# Patient Record
Sex: Male | Born: 1975 | Race: White | Hispanic: No | Marital: Single | Smoking: Current some day smoker
Health system: Southern US, Community
[De-identification: ages and names within clinical notes are randomized; demographics above are authoritative.]

## PROBLEM LIST (undated history)

## (undated) DIAGNOSIS — R000 Tachycardia, unspecified: Secondary | ICD-10-CM

## (undated) DIAGNOSIS — M419 Scoliosis, unspecified: Secondary | ICD-10-CM

## (undated) DIAGNOSIS — T7840XA Allergy, unspecified, initial encounter: Secondary | ICD-10-CM

## (undated) HISTORY — DX: Allergy, unspecified, initial encounter: T78.40XA

## (undated) HISTORY — DX: Tachycardia, unspecified: R00.0

## (undated) HISTORY — PX: COLONOSCOPY: SHX174

---
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
## 2019-06-08 HISTORY — PX: TRANSTHORACIC ECHOCARDIOGRAM: SHX275

## 2021-04-19 ENCOUNTER — Other Ambulatory Visit: Payer: Self-pay

## 2021-04-19 ENCOUNTER — Emergency Department (HOSPITAL_COMMUNITY)
Admission: EM | Admit: 2021-04-19 | Discharge: 2021-04-19 | Disposition: A | Discharge status: Home / Self Care | Payer: Managed Care, Other (non HMO) | Attending: Emergency Medicine | Admitting: Emergency Medicine

## 2021-04-19 ENCOUNTER — Emergency Department (HOSPITAL_COMMUNITY): Payer: Managed Care, Other (non HMO)

## 2021-04-19 ENCOUNTER — Encounter (HOSPITAL_COMMUNITY): Payer: Self-pay | Admitting: Emergency Medicine

## 2021-04-19 DIAGNOSIS — R609 Edema, unspecified: Secondary | ICD-10-CM

## 2021-04-19 DIAGNOSIS — Z91040 Latex allergy status: Secondary | ICD-10-CM | POA: Diagnosis not present

## 2021-04-19 DIAGNOSIS — R002 Palpitations: Secondary | ICD-10-CM

## 2021-04-19 DIAGNOSIS — R200 Anesthesia of skin: Secondary | ICD-10-CM

## 2021-04-19 DIAGNOSIS — R0602 Shortness of breath: Secondary | ICD-10-CM | POA: Diagnosis not present

## 2021-04-19 DIAGNOSIS — F17200 Nicotine dependence, unspecified, uncomplicated: Secondary | ICD-10-CM | POA: Insufficient documentation

## 2021-04-19 DIAGNOSIS — R718 Other abnormality of red blood cells: Secondary | ICD-10-CM | POA: Diagnosis not present

## 2021-04-19 DIAGNOSIS — R531 Weakness: Secondary | ICD-10-CM | POA: Diagnosis not present

## 2021-04-19 DIAGNOSIS — R000 Tachycardia, unspecified: Secondary | ICD-10-CM

## 2021-04-19 DIAGNOSIS — R202 Paresthesia of skin: Secondary | ICD-10-CM | POA: Insufficient documentation

## 2021-04-19 DIAGNOSIS — Z20822 Contact with and (suspected) exposure to covid-19: Secondary | ICD-10-CM | POA: Diagnosis not present

## 2021-04-19 DIAGNOSIS — R600 Localized edema: Secondary | ICD-10-CM | POA: Diagnosis not present

## 2021-04-19 DIAGNOSIS — E876 Hypokalemia: Secondary | ICD-10-CM | POA: Diagnosis not present

## 2021-04-19 DIAGNOSIS — D7589 Other specified diseases of blood and blood-forming organs: Secondary | ICD-10-CM

## 2021-04-19 HISTORY — DX: Scoliosis, unspecified: M41.9

## 2021-04-19 LAB — ETHANOL: Alcohol, Ethyl (B): <10 mg/dL (ref <10)

## 2021-04-19 LAB — I-STAT VENOUS BLOOD GAS, ED
Acid-Base Excess: 1 mmol/L (ref 0.0–2.0)
Bicarbonate: 26.6 mmol/L (ref 20.0–28.0)
Calcium, Ion: 1.05 mmol/L — ABNORMAL LOW (ref 1.15–1.40)
HCT: 44 % (ref 39.0–52.0)
Hemoglobin: 15 g/dL (ref 13.0–17.0)
O2 Saturation: 42 %
Potassium: 3.2 mmol/L — ABNORMAL LOW (ref 3.5–5.1)
Sodium: 138 mmol/L (ref 135–145)
TCO2: 28 mmol/L (ref 22–32)
pCO2, Ven: 44.3 mmHg (ref 44.0–60.0)
pH, Ven: 7.386 (ref 7.250–7.430)
pO2, Ven: 24 mmHg — CL (ref 32.0–45.0)

## 2021-04-19 LAB — COMPREHENSIVE METABOLIC PANEL
ALT: 94 U/L — ABNORMAL HIGH (ref 0–44)
AST: 107 U/L — ABNORMAL HIGH (ref 15–41)
Albumin: 3.5 g/dL (ref 3.5–5.0)
Alkaline Phosphatase: 143 U/L — ABNORMAL HIGH (ref 38–126)
Anion gap: 8 (ref 5–15)
BUN: 11 mg/dL (ref 6–20)
CO2: 26 mmol/L (ref 22–32)
Calcium: 8.3 mg/dL — ABNORMAL LOW (ref 8.9–10.3)
Chloride: 102 mmol/L (ref 98–111)
Creatinine, Ser: 1.23 mg/dL (ref 0.61–1.24)
GFR, Estimated: >60 mL/min (ref >60)
Glucose, Bld: 102 mg/dL — ABNORMAL HIGH (ref 70–99)
Potassium: 3.2 mmol/L — ABNORMAL LOW (ref 3.5–5.1)
Sodium: 136 mmol/L (ref 135–145)
Total Bilirubin: 2 mg/dL — ABNORMAL HIGH (ref 0.3–1.2)
Total Protein: 6.3 g/dL — ABNORMAL LOW (ref 6.5–8.1)

## 2021-04-19 LAB — RAPID URINE DRUG SCREEN, HOSP PERFORMED
Amphetamines: NOT DETECTED
Barbiturates: NOT DETECTED
Benzodiazepines: NOT DETECTED
Cocaine: NOT DETECTED
Opiates: NOT DETECTED
Tetrahydrocannabinol: NOT DETECTED

## 2021-04-19 LAB — CBC WITH DIFFERENTIAL/PLATELET
Abs Immature Granulocytes: 0.01 10*3/uL (ref 0.00–0.07)
Basophils Absolute: 0.1 10*3/uL (ref 0.0–0.1)
Basophils Relative: 1 %
Eosinophils Absolute: 0.1 10*3/uL (ref 0.0–0.5)
Eosinophils Relative: 2 %
HCT: 37.5 % — ABNORMAL LOW (ref 39.0–52.0)
Hemoglobin: 12.5 g/dL — ABNORMAL LOW (ref 13.0–17.0)
Immature Granulocytes: 0 %
Lymphocytes Relative: 32 %
Lymphs Abs: 2 10*3/uL (ref 0.7–4.0)
MCH: 34.7 pg — ABNORMAL HIGH (ref 26.0–34.0)
MCHC: 33.3 g/dL (ref 30.0–36.0)
MCV: 104.2 fL — ABNORMAL HIGH (ref 80.0–100.0)
Monocytes Absolute: 0.8 10*3/uL (ref 0.1–1.0)
Monocytes Relative: 13 %
Neutro Abs: 3.1 10*3/uL (ref 1.7–7.7)
Neutrophils Relative %: 52 %
Platelets: 183 10*3/uL (ref 150–400)
RBC: 3.6 MIL/uL — ABNORMAL LOW (ref 4.22–5.81)
RDW: 13.7 % (ref 11.5–15.5)
WBC: 6.1 10*3/uL (ref 4.0–10.5)
nRBC: 0 % (ref 0.0–0.2)

## 2021-04-19 LAB — RESP PANEL BY RT-PCR (FLU A&B, COVID) ARPGX2
Influenza A by PCR: NEGATIVE
Influenza B by PCR: NEGATIVE
SARS Coronavirus 2 by RT PCR: NEGATIVE

## 2021-04-19 LAB — TSH: TSH: 1.632 u[IU]/mL (ref 0.350–4.500)

## 2021-04-19 LAB — VITAMIN B12: Vitamin B-12: 416 pg/mL (ref 180–914)

## 2021-04-19 LAB — BRAIN NATRIURETIC PEPTIDE: B Natriuretic Peptide: 26.7 pg/mL (ref 0.0–100.0)

## 2021-04-19 LAB — LACTIC ACID, PLASMA: Lactic Acid, Venous: 1.2 mmol/L (ref 0.5–1.9)

## 2021-04-19 LAB — TROPONIN I (HIGH SENSITIVITY): Troponin I (High Sensitivity): 11 ng/L (ref <18)

## 2021-04-19 LAB — T4, FREE: Free T4: 0.88 ng/dL (ref 0.61–1.12)

## 2021-04-19 LAB — MAGNESIUM: Magnesium: 1.6 mg/dL — ABNORMAL LOW (ref 1.7–2.4)

## 2021-04-19 MED ORDER — POTASSIUM CHLORIDE CRYS ER 20 MEQ PO TBCR
40.0000 meq | EXTENDED_RELEASE_TABLET | Freq: Once | ORAL | Status: AC
  Administered 2021-04-19: 40 meq via ORAL
  Filled 2021-04-19: qty 2

## 2021-04-19 MED ORDER — MAGNESIUM SULFATE 2 GM/50ML IV SOLN
2.0000 g | Freq: Once | INTRAVENOUS | Status: AC
  Administered 2021-04-19: 2 g via INTRAVENOUS
  Filled 2021-04-19: qty 50

## 2021-04-19 NOTE — ED Triage Notes (Signed)
Pt arrives via EMS from [facility]. Per EMS pt began feeling weak about halfway through his shift last night, although pt reports feeling poorly yesterday when he woke up. Reports leg swelling when standing on his feet for a long time and endorses tingling to same, ongoing x several weeks. Pt went to [facility] this morning and reports feeling like he might pass out while walking back to the exam room. Also endorses palpitations and mild shob.

## 2021-04-19 NOTE — ED Notes (Signed)
Ambulated patient while monitoring oxygen and heart rate at 1300 per doctors orders. Patient had difficulty walking as he put a lot of weight on the monitors. He was able to walk and stand up straight independently. His heart rate ranged from 115-120 bpm, and spo2 was at 99-100% throughout the walk.

## 2021-04-19 NOTE — ED Provider Notes (Signed)
[facility] EMERGENCY DEPARTMENT Provider Note   CSN: [id number] Arrival date & time: 04/19/21  0932     History Chief Complaint  Patient presents with  . Weakness    Jacob Davila is a 45 y.o. male.  HPI      45yo male with history of scoliosis surgery presents with concern for leg swelling for weeks, tingling in legs for days, severe generalized weakness beginning last night with shortness of breath and palpitations.  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  Several weeks ago he noted waxing and waning swelling in his legs, with which seemed to improve when he put his legs up, however has been new over the last several weeks.  Over the last several days, he has developed tingling in his bilateral lower extremities.  Yesterday when he woke up in the afternoon prior to work he felt very fatigued.  He reports he still ate and went to work, however when he was at work he began to feel increased generalized weakness which was severe, and severe heart racing.  Reports he also had sensation of shortness of breath that developed last night.  The shortness of breath is worse with laying down flat and with ambulation.  He reports chills.  Denies fevers, cough, nausea, vomiting, abdominal pain, diarrhea, black or bloody stools, headache, tick bites.  Reports he does smoke cigarettes, but denies alcohol use or use of other drugs.  He reports the only medication he takes is Benadryl, and that he takes approximately 2 tablets/day.  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  He has never had symptoms like this before.  He cannot recall any other new exposures.  Denies sick contacts, congestion, sore throat.  He does have a rash on his left lower extremity which has been present for approximately 8 months and is itchy.   Acknowledges some back pain which she has chronically in relation to the weather, reports it was worse over this winter, but denies any acute change in pain recently.   Past Medical History:  Diagnosis Date  . Scoliosis     There are no problems to display for this patient.   Past Surgical History:  Procedure Laterality Date  . BACK SURGERY         No family history on file.  Social History   Tobacco Use  . Smoking status: Current Some Day Smoker  . Smokeless tobacco: Never Used  Vaping Use  . Vaping Use: Never used  Substance Use Topics  . Alcohol use: Not Currently  . Drug use: Never    Home Medications Prior to Admission medications   Medication Sig Start Date End Date Taking? Authorizing Provider  diphenhydrAMINE (BENADRYL) 25 mg capsule Take 25 mg by mouth daily as needed for allergies.   Yes [provider]  ibuprofen (ADVIL) 200 MG tablet Take 400 mg by mouth every 6 (six) hours as needed for mild pain.   Yes [provider]    Allergies    Latex and Morphine and related  Review of Systems   Review of Systems  Constitutional: Positive for fatigue. Negative for fever.  HENT: Negative for sore throat.   Eyes: Negative for visual disturbance.  Respiratory: Positive for shortness of breath. Negative for cough.   Cardiovascular: Negative for chest pain.  Gastrointestinal: Negative for abdominal pain, blood in stool, diarrhea, nausea and vomiting.  Genitourinary: Negative for difficulty urinating.  Musculoskeletal: Positive for gait problem (due to weakness). Negative for back pain and neck stiffness.  Skin: Negative for rash.  Neurological: Positive for numbness. Negative for syncope, facial asymmetry, speech difficulty, weakness (generalized) and headaches.    Physical Exam Updated Vital Signs BP 131/83   Pulse 92   Temp 98.5 F (36.9 C) (Oral)   Resp 13   Ht $R'5\' 10"'nv$  (1.778 m)   Wt 74.8 kg   SpO2 97%   BMI 23.68 kg/m   Physical  Exam Vitals and nursing note reviewed.  Constitutional:      General: He is not in acute distress.    Appearance: He is well-developed. He is not diaphoretic.  HENT:     Head: Normocephalic and atraumatic.  Eyes:     Conjunctiva/sclera: Conjunctivae normal.  Cardiovascular:     Rate and Rhythm: Regular rhythm. Tachycardia present.     Heart sounds: Normal heart sounds. No murmur heard. No friction rub. No gallop.   Pulmonary:     Effort: Pulmonary effort is normal. No respiratory distress.     Breath sounds: Normal breath sounds. No wheezing or rales.  Abdominal:     General: There is no distension.     Palpations: Abdomen is soft.     Tenderness: There is no abdominal tenderness. There is no guarding.  Musculoskeletal:     Cervical back: Normal range of motion.     Right lower leg: Edema present.     Left lower leg: Edema present.  Skin:    General: Skin is warm and dry.  Neurological:     Mental Status: He is alert and oriented to person, place, and time.     GCS: GCS eye subscore is 4. GCS verbal subscore is 5. GCS motor subscore is 6.     Cranial Nerves: Cranial nerves are intact. No cranial nerve deficit, dysarthria or facial asymmetry.     Sensory: Sensory deficit: reports tingling to bilateral lower extremities diffusely, no other altered sensatoin.     Motor: Tremor present. No weakness or pronator drift.     Coordination: Finger-Nose-Finger Test normal.     Deep Tendon Reflexes:     Reflex Scores:      Patellar reflexes are 3+ on the right side and 3+ on the left side.    ED Results / Procedures / Treatments   Labs (all labs ordered are listed, but only abnormal results are displayed) Labs Reviewed  CBC WITH DIFFERENTIAL/PLATELET - Abnormal; Notable for the following components:      Result Value   RBC 3.60 (*)    Hemoglobin 12.5 (*)    HCT 37.5 (*)    MCV 104.2 (*)    MCH 34.7 (*)    All other components within normal limits  COMPREHENSIVE METABOLIC PANEL  - Abnormal; Notable for the following components:   Potassium 3.2 (*)    Glucose, Bld 102 (*)    Calcium 8.3 (*)    Total Protein 6.3 (*)    AST 107 (*)    ALT 94 (*)    Alkaline Phosphatase 143 (*)    Total Bilirubin 2.0 (*)    All other components within normal limits  MAGNESIUM - Abnormal; Notable for the following components:   Magnesium 1.6 (*)    All other components within normal limits  I-STAT VENOUS BLOOD GAS, ED - Abnormal; Notable for  the following components:   pO2, Ven 24.0 (*)    Potassium 3.2 (*)    Calcium, Ion 1.05 (*)    All other components within normal limits  CULTURE, BLOOD (ROUTINE X 2)  CULTURE, BLOOD (ROUTINE X 2)  RESP PANEL BY RT-PCR (FLU A&B, COVID) ARPGX2  BRAIN NATRIURETIC PEPTIDE  LACTIC ACID, PLASMA  RAPID URINE DRUG SCREEN, HOSP PERFORMED  ETHANOL  T4, FREE  TSH  VITAMIN B12  T3, FREE  VITAMIN B1  TROPONIN I (HIGH SENSITIVITY)    EKG EKG Interpretation  Date/Time:  Monday Apr 19 2021 09:37:18 EDT Ventricular Rate:  114 PR Interval:  161 QRS Duration: 82 QT Interval:  333 QTC Calculation: 459 R Axis:   44 Text Interpretation: Sinus tachycardia No previous ECGs available Confirmed by [name] [phone]) on 04/19/2021 10:14:21 AM   Radiology DG Chest Portable 1 View  Result Date: 04/19/2021 CLINICAL DATA:  Shortness of breath EXAM: PORTABLE CHEST 1 VIEW COMPARISON:  None. FINDINGS: Low lung volumes with mild bibasilar opacities, likely atelectasis. No pleural effusion or pneumothorax. The heart is normal in size. Thoracolumbar spinal fixation rods. IMPRESSION: Low lung volumes with mild bibasilar opacities, likely atelectasis. Electronically Signed   By: Julian Hy M.D.   On: 04/19/2021 10:25    Procedures Procedures   Medications Ordered in ED Medications  magnesium sulfate IVPB 2 g 50 mL (0 g Intravenous Stopped 04/19/21 1318)  potassium chloride SA (KLOR-CON) CR tablet 40 mEq (40 mEq Oral Given 04/19/21 1217)    ED  Course  I have reviewed the triage vital signs and the nursing notes.  Pertinent labs & imaging results that were available during my care of the patient were reviewed by me and considered in my medical decision making (see chart for details).    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  DDx includes myocarditis, CHF, ACS, PE, pneumonia, anemia, electrolyte abnormality, hyperthyroidism, other toxic/metabolic exposure, lumbar spine pathology/MS/infection, sepsis.   Denies headache, does not have focal findings to suggest CVA, ICH, meningitis. Has reflexes and doubt GBS.  Normal pulses bilaterally doubt dissection or acute arterial thrombus.  Denies significant use of benadryl, denies drug use or possibility of withdrawal.  Ordered initial evaluation including CBC/thyroid studies/BNP/troponin/blood cultures/CXR.   Work-up shows no significant anemia or electrolyte abnormality.  Mild hypokalemia and hypomagnesemia.  Troponin negative with no chest pain and symptoms beginning greater than 6 hours ago, have low suspicion for ACS.  BNP is within normal limits, chest x-ray shows normal heart size and no signs of pulmonary edema.  Chest x-ray shows possible atelectasis, but no other findings to suggest pneumonia.  Lactic acid is within normal limits, no leukocytosis noted.  TSH within normal limits.  CMP shows mild elevation in AST, ALT, alk phos and bilirubin, but he does not have abdominal pain to suggest Coley cystitis or cholelithiasis, and feel further evaluation is appropriate to pursue as an outpatient.  Normal oxygenation, no asymmetric swelling or pain, no PE risk factors.   He does not have weakness on exam, and do not suspect acute spinal emergency that would require  surgery.  Suspect other etiology of bilateral lower extremity tingling, and discussed  with neurology on the phone.  Feel work-up is appropriate to be done as an outpatient, with outpatient neurology.  Sent vitamin B12, thiamine, and B6 levels.  Considered thiamine deficiency as etiology of bilateral lower extremity tingling, tachycardia, and edema, but he does not have findings to suggest Warnicke Korsakoff, and does not have findings to suggest significant heart failure, and further evaluation can be completed as an outpatient.  Vitamin levels sent and pending.  Recommend outpatient follow-up with neurology for the tingling, cardiology for the palpitations and edema, and primary care physician for continued evaluation of above as well as abnormalities on CMP.  Discussed reasons to return to the emergency department in detail.  Stable to continue outpatient evaluation with understanding of reasons to return.   Final Clinical Impression(s) / ED Diagnoses Final diagnoses:  Generalized weakness  Tachycardia  Edema, unspecified type  Numbness and tingling  Increased MCV  Palpitations    Rx / DC Orders ED Discharge Orders    None       [name], MD 04/20/21 [phone]

## 2021-04-20 ENCOUNTER — Encounter: Payer: Self-pay | Admitting: Neurology

## 2021-04-20 LAB — T3, FREE: T3, Free: 3.6 pg/mL (ref 2.0–4.4)

## 2021-04-24 LAB — CULTURE, BLOOD (ROUTINE X 2)
Culture: NO GROWTH
Culture: NO GROWTH

## 2021-04-27 LAB — VITAMIN B1: Vitamin B1 (Thiamine): 90 nmol/L (ref 66.5–200.0)

## 2021-05-17 ENCOUNTER — Encounter: Payer: Self-pay | Admitting: *Deleted

## 2021-05-17 ENCOUNTER — Other Ambulatory Visit: Payer: Self-pay

## 2021-05-17 ENCOUNTER — Ambulatory Visit: Payer: Managed Care, Other (non HMO) | Admitting: Cardiology

## 2021-05-17 ENCOUNTER — Encounter: Payer: Self-pay | Admitting: Cardiology

## 2021-05-17 VITALS — BP 122/62 | HR 104 | Ht 71.0 in | Wt 166.4 lb

## 2021-05-17 DIAGNOSIS — R42 Dizziness and giddiness: Secondary | ICD-10-CM | POA: Diagnosis not present

## 2021-05-17 DIAGNOSIS — R000 Tachycardia, unspecified: Secondary | ICD-10-CM | POA: Diagnosis not present

## 2021-05-17 DIAGNOSIS — I83893 Varicose veins of bilateral lower extremities with other complications: Secondary | ICD-10-CM

## 2021-05-17 DIAGNOSIS — I479 Paroxysmal tachycardia, unspecified: Secondary | ICD-10-CM | POA: Diagnosis not present

## 2021-05-17 NOTE — Progress Notes (Signed)
Primary Care Provider: Pcp, No Cardiologist: Glenetta Hew, MD Electrophysiologist: None  Clinic Note: Chief Complaint  Patient presents with   Hospitalization Follow-up    ER follow-up - Leg swelling / tingling, heart racing & fatigue   ===================================  ASSESSMENT/PLAN   Problem List Items Addressed This Visit     Varicose veins of leg with swelling, bilateral    He is on her feet for an entire 12-hour shift, and quite likely has some venous stasis.  We talked about importance of wearing support socks, and elevating his feet whenever possible. Compression   strength    8-15 mmHg X 15-20 mmHg                           20-30 mmHg  30-40 mmHg.  For confirmation, will check lower extremity venous duplex Dopplers to evaluate for venous reflux.  Sisters association with irregular heartbeats and dyspnea, will also check a 2D echocardiogram just to exclude CHF.       Relevant Orders   VAS [place] LOWER EXTREMITY VENOUS REFLUX   Paroxysmal tachycardia ([place]) - Primary    Episodes are happening, but not very frequently.  I would have wanted to do a shorter day monitor, but since there may be happening once or twice in the last couple weeks, we will do a full 30-day monitor so as not to miss any episodes.  He also has a smart watch which can record his heart rates.  He is not sure if he can record rhythms.  Plan: 30-day event monitor. Stay adequately hydrated.       Relevant Orders   EKG 12-Lead (Completed)   ECHOCARDIOGRAM COMPLETE   CARDIAC EVENT MONITOR   Sinus tachycardia    He is tachycardic here in the clinic today.  Would like to know what I am treating overall these tachycardia spells.  Could very well just be sinus tachycardia, but with family history of A. fib, need to exclude A. fib.       Relevant Orders   EKG 12-Lead (Completed)   ECHOCARDIOGRAM COMPLETE   CARDIAC EVENT MONITOR   Dizziness    Mostly associated with tachycardia.  Will evaluate  with monitor and echo.       Relevant Orders   EKG 12-Lead (Completed)   ECHOCARDIOGRAM COMPLETE   CARDIAC EVENT MONITOR    ===================================  HPI:    Jacob Davila is a 45 y.o. male with only prior history notable for surgery to repair scoliosis who is being seen today for URGENT CARE/ ER  f/u evaluation of SHORTNESS OF BREATH, PALPITATIONS, LEG SWELLING/VARICOSE VEINS, DIZZINESS AND EXTREME FATIGUE at the request of Jacob Morgan, MD.   Recent Hospitalizations:  Jacob Davila was @ [facility] & transferred to the ER 5/30-31/2022 -> he had concerns for significant leg swelling with tingling as well as weakness shortness of breath and palpitations.  He noticed several weeks of worsening swelling, but on the day of presentation 7 they were exposed lawn up like balloons and very painful with tingling/aching.  Putting them up did help some but it really was not all you have gone by the next morning.  He felt associated fatigue and leg weakness.  He also just felt in general very poorly for the last several days leading up to his ER visit-noted generalized weakness and also severe heart racing.  Heart racing associated with profound shortness of breath worse with lying  flat and ambulation..  Reviewed  CV studies:    The following studies were reviewed today: (if available, images/films reviewed: From Epic Chart or Care Everywhere) None:   Interval History:   Jacob Davila presents here today indicating that he has not had as bad a spell as he had when he went to the ER last month, but is still noticing that his legs continue to get very swollen, painful and tender with tingling all over.  He describes it as though he feels his legs are about to pop.  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  A somewhat separate symptom is that he notes that his heart rate has been going up significantly  where he had 2 episodes 1 we went to the ER and then 1 last week where he felt really dizzy and lightheaded with his heart rate going really fast and a fluttering feeling lasting about 2 to 3 minutes.  He felt dizzy and lightheaded but did not pass out.    He does not drink any coffee and maybe has 1 soft drink per day.  Does not drink tea.  Denies any illicit substance use, and is very insistent that he does not have any risk of exposure with his job as a English as a second language teacher.  Everything is done with the utmost of safety.  CV Review of Symptoms (Summary) Cardiovascular ROS: positive for - chest pain, edema, irregular heartbeat, palpitations, rapid heart rate, shortness of breath, and lightheaded dizziness with near syncope negative for - loss of consciousness, orthopnea, paroxysmal nocturnal dyspnea, or TIA/amaurosis fugax, claudication  REVIEWED OF SYSTEMS   Review of Systems  Constitutional:  Positive for chills and malaise/fatigue. Negative for fever and weight loss.  HENT:  Negative for congestion and nosebleeds.   Respiratory:  Positive for shortness of breath (With the tachycardia).   Cardiovascular:        Per HPI  Gastrointestinal:  Negative for blood in stool and melena.  Genitourinary:  Negative for hematuria.  Musculoskeletal:  Positive for myalgias. Negative for joint pain.  Neurological:  Positive for dizziness, tingling (Tingling in his legs) and weakness (Legs feel weak and heavy).  Psychiatric/Behavioral:  Negative for memory loss. The patient is nervous/anxious (Very anxious about the symptoms). The patient does not have insomnia.    I have reviewed and (if needed) personally updated the patient's problem list, medications, allergies, past medical and surgical history, social and family history.   PAST MEDICAL HISTORY   Past Medical History:  Diagnosis Date   Scoliosis    - s/p correctvie Sgx    PAST SURGICAL HISTORY   Past Surgical History:  Procedure Laterality Date   BACK  SURGERY  03/1990   Scoliosis    There is no immunization history on file for this patient.  MEDICATIONS/ALLERGIES   Current Meds  Medication Sig   diphenhydrAMINE (BENADRYL) 25 mg capsule Take 25 mg by mouth daily as needed for allergies.   ibuprofen (ADVIL) 200 MG tablet Take 400 mg by mouth every 6 (six) hours as needed for mild pain.    Allergies  Allergen Reactions   Latex Rash   Morphine And Related Rash    SOCIAL HISTORY/FAMILY HISTORY   Reviewed in Epic:  Pertinent findings:  Social History   Tobacco Use   Smoking status: Some Days    Pack years: 0.00    Types: Cigars  Smokeless tobacco: Never  Vaping Use   Vaping Use: Never used  Substance Use Topics   Alcohol use: Not Currently   Drug use: Never   Social History   Social History Narrative   Not on file   Reports he does smoke cigarettes, but denies alcohol use or use of other drugs.  He reports the only medication he takes is Benadryl, and that he takes approximately 2 tablets/day.  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  He has never had symptoms like this before.    OBJCTIVE -PE, EKG, labs   Wt Readings from Last 3 Encounters:  05/17/21 166 lb 6.4 oz (75.5 kg)  04/19/21 165 lb (74.8 kg)    Physical Exam: BP 122/62 (BP Location: Left Arm, Patient Position: Sitting, Cuff Size: Normal)   Pulse (!) 104   Ht 5\' 11"  (1.803 m)   Wt 166 lb 6.4 oz (75.5 kg)   SpO2 98%   BMI 23.21 kg/m  Physical Exam Vitals reviewed.  Constitutional:      General: He is not in acute distress.    Appearance: Normal appearance. He is normal weight. He is not toxic-appearing.     Comments: Overall relatively healthy appearing although he does seem to be in some discomfort.  No acute distress.  Well groomed and well nourished.  HENT:     Head: Normocephalic and  atraumatic.  Neck:     Vascular: No carotid bruit or JVD.  Cardiovascular:     Rate and Rhythm: Regular rhythm. Tachycardia present. No extrasystoles are present.    Chest Wall: PMI is not displaced.     Pulses: Normal pulses.     Heart sounds: Murmur (Cannot exclude soft systolic murmur that sounds to be tensely holosystolic.) heard.    No friction rub. No gallop.  Pulmonary:     Effort: Pulmonary effort is normal. No respiratory distress.     Breath sounds: Normal breath sounds. No wheezing, rhonchi or rales.  Chest:     Chest wall: No tenderness.  Musculoskeletal:        General: Swelling (Maybe 1+ bilateral edema at the ankles but nothing compared to what he describes) present.     Cervical back: Normal range of motion and neck supple.  Skin:    General: Skin is warm and dry.     Comments: Bilateral ankles have prominent spider nevi with small varicose veins and mild discoloration of the ankles and feet.  Neurological:     General: No focal deficit present.     Mental Status: He is alert and oriented to person, place, and time.     Motor: No weakness.     Gait: Gait normal.  Psychiatric:        Thought Content: Thought content normal.        Judgment: Judgment normal.     Comments: Seems very anxious and nervous/concerned.     Adult ECG Report  Rate: 10 ;  Rhythm: sinus tachycardia and otherwise normal axis, intervals and durations. ;   Narrative Interpretation: Essentially Normal EKG  Recent Labs: Labs reviewed No results found for: CHOL, HDL, LDLCALC, LDLDIRECT, TRIG, CHOLHDL Lab Results  Component Value Date   CREATININE 1.23 04/19/2021   BUN 11 04/19/2021   NA 138 04/19/2021   K 3.2 (L) 04/19/2021   CL  102 04/19/2021   CO2 26 04/19/2021   CBC Latest Ref Rng & Units 04/19/2021 04/19/2021  WBC 4.0 - 10.5 K/uL - 6.1  Hemoglobin 13.0 - 17.0 g/dL 15.0 12.5(L)  Hematocrit 39.0 - 52.0 % 44.0 37.5(L)  Platelets 150 - 400 K/uL - 183    Lab Results  Component Value  Date   TSH 1.632 04/19/2021    ==================================================  COVID-19 Education: The signs and symptoms of COVID-19 were discussed with the patient and how to seek care for testing (follow up with PCP or arrange E-visit).    I spent a total of 28 minutes with the patient spent in direct patient consultation.  Additional time spent with chart review  / charting (studies, outside notes, etc): 18 min Total Time: 46 min  Current medicines are reviewed at length with the patient today.  (+/- concerns) n/a  This visit occurred during the SARS-CoV-2 public health emergency.  Safety protocols were in place, including screening questions prior to the visit, additional usage of staff PPE, and extensive cleaning of exam room while observing appropriate contact time as indicated for disinfecting solutions.  Notice: This dictation was prepared with Dragon dictation along with smaller phrase technology. Any transcriptional errors that result from this process are unintentional and may not be corrected upon review.  Patient Instructions / Medication Changes & Studies & Tests Ordered   Patient Instructions  Medication Instructions:  No changes *If you need a refill on your cardiac medications before your next appointment, please call your pharmacy*   Lab Work: Not needed   Testing/Procedures: Will be schedule at [place] has requested that you have an echocardiogram. Echocardiography is a painless test that uses sound waves to create images of your heart. It provides your doctor with information about the size and shape of your heart and how well your heart's chambers and valves are working. This procedure takes approximately one hour. There are no restrictions for this procedure. And  Will be schedule at [place] has requested that you have a lower extremity venous duplex for reflux. This test is an  ultrasound of the veins in the legs or arms. It looks at venous blood flow that carries blood from the heart to the legs.  Allow one hour for a Lower Venous exam.There are no restrictions or special instructions.   And Your physician has recommended that you wear an event monitor 30 days . Event monitors are medical devices that record the heart's electrical activity. Doctors most often [place] these monitors to diagnose arrhythmias. Arrhythmias are problems with the speed or rhythm of the heartbeat. The monitor is a small, portable device. You can wear one while you do your normal daily activities. This is usually used to diagnose what is causing palpitations/syncope (passing out).   Follow-Up: At [facility], you and your health needs are our priority.  As part of our continuing mission to provide you with exceptional heart care, we have created designated Provider Care Teams.  These Care Teams include your primary Cardiologist (physician) and Advanced Practice Providers (APPs -  Physician Assistants and Nurse Practitioners) who all work together to provide you with the care you need, when you need it.  We recommend signing up for the patient portal called "MyChart".  Sign up information is provided on this After Visit Summary.  MyChart is used to connect with patients for Virtual Visits (Telemedicine).  Patients are able to view  lab/test results, encounter notes, upcoming appointments, etc.  Non-urgent messages can be sent to your provider as well.   To learn more about what you can do with MyChart, go to [email or web address].    Your next appointment:   2 month(s)  The format for your next appointment:   Virtual Visit   Provider:   Glenetta Hew, MD   Other Instructions  Keep feet elevated as much as possible   recommends you purchase some compression  socks/hose from [facility] in [place] ,[place]. You do not need an prescription to purchase the items.  Address  [address]  [place], [place] [address]  Phone  [phone]   Compression   strength    8-15 mmHg X 15-20 mmHg                           20-30 mmHg  30-40 mmHg.  You may also try a medical supply store, department store (i.e.- [place], [employer], [employer], specialty shoe stores ( shoe market), [employer] and [employer]) or  Chartered loss adjuster uniform store.    [employer] Cardiac Event Monitor Instructions Your physician has requested you wear your cardiac event monitor for ___30__ days, (1-30). [employer] may call or text to confirm a shipping address. The monitor will be sent to a land address via UPS. [employer] will not ship a monitor to a PO BOX. It typically takes 3-5 days to receive your monitor after it has been enrolled. [employer] will assist with USPS tracking if your package is delayed. The telephone number for [employer] is [phone]. Once you have received your monitor, please review the enclosed instructions. Instruction tutorials can also be viewed under help and settings on the enclosed cell phone. Your monitor has already been registered assigning a specific monitor serial # to you.  Applying the monitor Remove cell phone from case and turn it on. The cell phone works as Dealer and needs to be within [employer] of you at all times. The cell phone will need to be charged on a daily basis. We recommend you plug the cell phone into the enclosed charger at your bedside table every night.  Monitor batteries: You will receive two monitor batteries labelled #1 and #2. These are your recorders. Plug battery #2 onto the second connection on the enclosed charger. Keep one battery on the charger at all times. This will keep the monitor battery deactivated. It will also keep it fully charged for when you need to switch your monitor batteries. A small light will be blinking on the battery emblem when it is charging. The light on the battery emblem will remain on when the battery is  fully charged.  Open package of a Monitor strip. Insert battery #1 into black hood on strip and gently squeeze monitor battery onto connection as indicated in instruction booklet. Set aside while preparing skin.  Choose location for your strip, vertical or horizontal, as indicated in the instruction booklet. Shave to remove all hair from location. There cannot be any lotions, oils, powders, or colognes on skin where monitor is to be applied. Wipe skin clean with enclosed Saline wipe. Dry skin completely.  Peel paper labeled #1 off the back of the Monitor strip exposing the adhesive. Place the monitor on the chest in the vertical or horizontal position shown in the instruction booklet. One arrow on the monitor strip must be pointing upward. Carefully remove  paper labeled #2, attaching remainder of strip to your skin. Try not to create any folds or wrinkles in the strip as you apply it.  Firmly press and release the circle in the center of the monitor battery. You will hear a small beep. This is turning the monitor battery on. The heart emblem on the monitor battery will light up every 5 seconds if the monitor battery in turned on and connected to the patient securely. Do not push and hold the circle down as this turns the monitor battery off. The cell phone will locate the monitor battery. A screen will appear on the cell phone checking the connection of your monitor strip. This may read poor connection initially but change to good connection within the next minute. Once your monitor accepts the connection you will hear a series of 3 beeps followed by a climbing crescendo of beeps. A screen will appear on the cell phone showing the two monitor strip placement options. Touch the picture that demonstrates where you applied the monitor strip.  Your monitor strip and battery are waterproof. You are able to shower, bathe, or swim with the monitor on. They just ask you do not submerge deeper than 3  feet underwater. We recommend removing the monitor if you are swimming in a lake, river, or ocean.  Your monitor battery will need to be switched to a fully charged monitor battery approximately once a week. The cell phone will alert you of an action which needs to be made.  On the cell phone, tap for details to reveal connection status, monitor battery status, and cell phone battery status. The green dots indicates your monitor is in good status. A red dot indicates there is something that needs your attention.  To record a symptom, click the circle on the monitor battery. In 30-60 seconds a list of symptoms will appear on the cell phone. Select your symptom and tap save. Your monitor will record a sustained or significant arrhythmia regardless of you clicking the button. Some patients do not feel the heart rhythm irregularities. [employer] will notify us of any serious or critical events.  Refer to instruction booklet for instructions on switching batteries, changing strips, the Do not disturb or Pause features, or any additional questions.  Call [employer] at [phone], to confirm your monitor is transmitting and record your baseline. They will answer any questions you may have regarding the monitor instructions at that time.  Returning the monitor to [place] all equipment back into blue box. Peel off strip of paper to expose adhesive and close box securely. There is a prepaid UPS shipping label on this box. Drop in a UPS drop box, or at a UPS facility like [employer]. You may also contact [employer] to arrange UPS to pick up monitor package at your home.    Studies Ordered:   Orders Placed This Encounter  Procedures   CARDIAC EVENT MONITOR   EKG 12-Lead   ECHOCARDIOGRAM COMPLETE   VAS [place] LOWER EXTREMITY VENOUS REFLUX     Glenetta Hew, M.D., M.S. Interventional Cardiologist   Pager # [phone] Phone # [phone] [address] [place], [place] [address]   Thank you for choosing [facility] at [facility]!!

## 2021-05-17 NOTE — Progress Notes (Signed)
Patient ID: Jacob Davila, male   DOB: 1976/07/29, 45 y.o.   MRN: [id number] Patient enrolled for [employer] to ship a 30 day cardiac event monitor to address on file.

## 2021-05-17 NOTE — Patient Instructions (Signed)
Medication Instructions:  No changes *If you need a refill on your cardiac medications before your next appointment, please call your pharmacy*   Lab Work: Not needed   Testing/Procedures: Will be schedule at [place] has requested that you have an echocardiogram. Echocardiography is a painless test that uses sound waves to create images of your heart. It provides your doctor with information about the size and shape of your heart and how well your heart's chambers and valves are working. This procedure takes approximately one hour. There are no restrictions for this procedure. And  Will be schedule at [place] has requested that you have a lower extremity venous duplex for reflux. This test is an ultrasound of the veins in the legs or arms. It looks at venous blood flow that carries blood from the heart to the legs.  Allow one hour for a Lower Venous exam.There are no restrictions or special instructions.   And Your physician has recommended that you wear an event monitor 30 days . Event monitors are medical devices that record the heart's electrical activity. Doctors most often [place] these monitors to diagnose arrhythmias. Arrhythmias are problems with the speed or rhythm of the heartbeat. The monitor is a small, portable device. You can wear one while you do your normal daily activities. This is usually used to diagnose what is causing palpitations/syncope (passing out).   Follow-Up: At [facility], you and your health needs are our priority.  As part of our continuing mission to provide you with exceptional heart care, we have created designated Provider Care Teams.  These Care Teams include your primary Cardiologist (physician) and Advanced Practice Providers (APPs -  Physician Assistants and Nurse Practitioners) who all work together to provide you with the care you need, when you need it.  We recommend signing  up for the patient portal called "MyChart".  Sign up information is provided on this After Visit Summary.  MyChart is used to connect with patients for Virtual Visits (Telemedicine).  Patients are able to view lab/test results, encounter notes, upcoming appointments, etc.  Non-urgent messages can be sent to your provider as well.   To learn more about what you can do with MyChart, go to [email or web address].    Your next appointment:   2 month(s)  The format for your next appointment:   Virtual Visit   Provider:   Glenetta Hew, MD   Other Instructions  Keep feet elevated as much as possible   recommends you purchase some compression  socks/hose from [facility] in [place] ,[place]. You do not need an prescription to purchase the items.  Address  [address] [place], [place] [address]  Phone  [phone]   Compression   strength    8-15 mmHg X 15-20 mmHg                           20-30 mmHg  30-40 mmHg.  You may also try a medical supply store, department store (i.e.- [employer], [employer], [employer], specialty shoe stores ( shoe market), [employer] and [employer]) or  Chartered loss adjuster uniform store.    [employer] Cardiac Event Monitor Instructions Your physician has requested you wear your cardiac event monitor for ___30__ days, (1-30). [employer] may call or text to confirm a shipping address. The monitor will be sent to a land address via  UPS. [employer] will not ship a monitor to a PO BOX. It typically takes 3-5 days to receive your monitor after it has been enrolled. [employer] will assist with USPS tracking if your package is delayed. The telephone number for [employer] is [phone]. Once you have received your monitor, please review the enclosed instructions. Instruction tutorials can also be viewed under help and settings on the enclosed cell phone. Your monitor has already been registered assigning a specific monitor serial # to you.  Applying the  monitor Remove cell phone from case and turn it on. The cell phone works as Dealer and needs to be within [employer] of you at all times. The cell phone will need to be charged on a daily basis. We recommend you plug the cell phone into the enclosed charger at your bedside table every night.  Monitor batteries: You will receive two monitor batteries labelled #1 and #2. These are your recorders. Plug battery #2 onto the second connection on the enclosed charger. Keep one battery on the charger at all times. This will keep the monitor battery deactivated. It will also keep it fully charged for when you need to switch your monitor batteries. A small light will be blinking on the battery emblem when it is charging. The light on the battery emblem will remain on when the battery is fully charged.  Open package of a Monitor strip. Insert battery #1 into black hood on strip and gently squeeze monitor battery onto connection as indicated in instruction booklet. Set aside while preparing skin.  Choose location for your strip, vertical or horizontal, as indicated in the instruction booklet. Shave to remove all hair from location. There cannot be any lotions, oils, powders, or colognes on skin where monitor is to be applied. Wipe skin clean with enclosed Saline wipe. Dry skin completely.  Peel paper labeled #1 off the back of the Monitor strip exposing the adhesive. Place the monitor on the chest in the vertical or horizontal position shown in the instruction booklet. One arrow on the monitor strip must be pointing upward. Carefully remove paper labeled #2, attaching remainder of strip to your skin. Try not to create any folds or wrinkles in the strip as you apply it.  Firmly press and release the circle in the center of the monitor battery. You will hear a small beep. This is turning the monitor battery on. The heart emblem on the monitor battery will light up every 5 seconds if the monitor  battery in turned on and connected to the patient securely. Do not push and hold the circle down as this turns the monitor battery off. The cell phone will locate the monitor battery. A screen will appear on the cell phone checking the connection of your monitor strip. This may read poor connection initially but change to good connection within the next minute. Once your monitor accepts the connection you will hear a series of 3 beeps followed by a climbing crescendo of beeps. A screen will appear on the cell phone showing the two monitor strip placement options. Touch the picture that demonstrates where you applied the monitor strip.  Your monitor strip and battery are waterproof. You are able to shower, bathe, or swim with the monitor on. They just ask you do not submerge deeper than 3 feet underwater. We recommend removing the monitor if you are swimming in a lake, river, or ocean.  Your monitor battery will need to be switched to a fully charged monitor battery  approximately once a week. The cell phone will alert you of an action which needs to be made.  On the cell phone, tap for details to reveal connection status, monitor battery status, and cell phone battery status. The green dots indicates your monitor is in good status. A red dot indicates there is something that needs your attention.  To record a symptom, click the circle on the monitor battery. In 30-60 seconds a list of symptoms will appear on the cell phone. Select your symptom and tap save. Your monitor will record a sustained or significant arrhythmia regardless of you clicking the button. Some patients do not feel the heart rhythm irregularities. [employer] will notify us of any serious or critical events.  Refer to instruction booklet for instructions on switching batteries, changing strips, the Do not disturb or Pause features, or any additional questions.  Call [employer] at [phone], to confirm your monitor is  transmitting and record your baseline. They will answer any questions you may have regarding the monitor instructions at that time.  Returning the monitor to [employer] all equipment back into blue box. Peel off strip of paper to expose adhesive and close box securely. There is a prepaid UPS shipping label on this box. Drop in a UPS drop box, or at a UPS facility like [employer]. You may also contact [employer] to arrange UPS to pick up monitor package at your home.

## 2021-05-21 DIAGNOSIS — I872 Venous insufficiency (chronic) (peripheral): Secondary | ICD-10-CM

## 2021-05-21 HISTORY — DX: Venous insufficiency (chronic) (peripheral): I87.2

## 2021-05-22 ENCOUNTER — Encounter: Payer: Self-pay | Admitting: Cardiology

## 2021-05-22 NOTE — Assessment & Plan Note (Signed)
Mostly associated with tachycardia.  Will evaluate with monitor and echo.

## 2021-05-22 NOTE — Assessment & Plan Note (Signed)
He is tachycardic here in the clinic today.  Would like to know what I am treating overall these tachycardia spells.  Could very well just be sinus tachycardia, but with family history of A. fib, need to exclude A. fib.

## 2021-05-22 NOTE — Assessment & Plan Note (Signed)
He is on her feet for an entire 12-hour shift, and quite likely has some venous stasis.  We talked about importance of wearing support socks, and elevating his feet whenever possible. Compression   strength    8-15 mmHg X 15-20 mmHg                           20-30 mmHg  30-40 mmHg.  For confirmation, will check lower extremity venous duplex Dopplers to evaluate for venous reflux.  Sisters association with irregular heartbeats and dyspnea, will also check a 2D echocardiogram just to exclude CHF.

## 2021-05-22 NOTE — Assessment & Plan Note (Signed)
Episodes are happening, but not very frequently.  I would have wanted to do a shorter day monitor, but since there may be happening once or twice in the last couple weeks, we will do a full 30-day monitor so as not to miss any episodes.  He also has a smart watch which can record his heart rates.  He is not sure if he can record rhythms.  Plan: 30-day event monitor. Stay adequately hydrated.

## 2021-05-27 ENCOUNTER — Encounter (HOSPITAL_BASED_OUTPATIENT_CLINIC_OR_DEPARTMENT_OTHER): Payer: Self-pay | Admitting: Family Medicine

## 2021-05-27 ENCOUNTER — Other Ambulatory Visit: Payer: Self-pay

## 2021-05-27 ENCOUNTER — Ambulatory Visit (HOSPITAL_BASED_OUTPATIENT_CLINIC_OR_DEPARTMENT_OTHER): Payer: Managed Care, Other (non HMO) | Admitting: Family Medicine

## 2021-05-27 VITALS — BP 136/74 | HR 96 | Ht 71.0 in | Wt 161.9 lb

## 2021-05-27 DIAGNOSIS — R748 Abnormal levels of other serum enzymes: Secondary | ICD-10-CM | POA: Insufficient documentation

## 2021-05-27 DIAGNOSIS — Z862 Personal history of diseases of the blood and blood-forming organs and certain disorders involving the immune mechanism: Secondary | ICD-10-CM | POA: Insufficient documentation

## 2021-05-27 DIAGNOSIS — D539 Nutritional anemia, unspecified: Secondary | ICD-10-CM | POA: Insufficient documentation

## 2021-05-27 DIAGNOSIS — R7401 Elevation of levels of liver transaminase levels: Secondary | ICD-10-CM | POA: Insufficient documentation

## 2021-05-27 DIAGNOSIS — I479 Paroxysmal tachycardia, unspecified: Secondary | ICD-10-CM

## 2021-05-27 NOTE — Assessment & Plan Note (Signed)
Will check GGT to determine if source of elevation is intra or extrahepatic Further work-up pending result of GGT Right upper quadrant ultrasound as per above

## 2021-05-27 NOTE — Progress Notes (Signed)
New Patient Office Visit  Subjective:  Patient ID: Jacob Davila, male    DOB: 05/06/1976  Age: 45 y.o. MRN: [id number]  CC:  Chief Complaint  Patient presents with   Establish Care   Dizziness    Patient complains of episodes of tachycardia in the 140's associated with lightheadedness, dizziness, and body tingling. He states he has noticed some associated "body swelling" as well. He states that he will work all day and notice the swelling towards the end of the day. When he wakes in the morning the swelling has dissipated but will return as soon as he starts working again. Patient has been dealing with these episodes since march and has seen cardiology and neuro    HPI Jacob Davila is a 45 year old male presenting to establish in clinic.  He has current concerns related to paroxysmal tachycardia, lower extremity tingling, swelling.  Patient was recently seen in the emergency department for these issues.  He is also established with cardiology and has upcoming appointment with neurology.  Ongoing work-up includes Holter monitor, echocardiogram, lower extremity duplex scan.  On review of chart, labs in the emergency department revealed transaminitis, elevated alkaline phosphatase, macrocytic anemia.  Additional labs that were checked and were normal included B12, TSH, ethanol level, BNP, thiamine.  Patient still some symptoms related to above.  He denies any significant past medical history.  Does report history of scoliosis with surgery for this in the past. Patient works as a English as a second language teacher.  Past Medical History:  Diagnosis Date   Scoliosis    - s/p correctvie Sgx    Past Surgical History:  Procedure Laterality Date   BACK SURGERY  03/1990   Scoliosis    Family History  Problem Relation Age of Onset   Atrial fibrillation Mother    Heart failure Mother    Hypertension Father    Hyperlipidemia Father    CAD Paternal Grandfather        noted to have Angina (not aware of details)     Social History   Socioeconomic History   Marital status: Single    Spouse name: Not on file   Number of children: Not on file   Years of education: Not on file   Highest education level: Not on file  Occupational History   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    Comment: Merchandiser, retail  Tobacco Use   Smoking status: Some Days    Pack years: 0.00    Types: Cigars   Smokeless tobacco: Never  Vaping Use   Vaping Use: Never used  Substance and Sexual Activity   Alcohol use: Not Currently   Drug use: Never   Sexual activity: Not on file  Other Topics Concern   Not on file  Social History Narrative   Not on file   Social Determinants of Health   Financial Resource Strain: Not on file  Food Insecurity: Not on file  Transportation Needs: Not on file  Physical Activity: Not on file  Stress: Not on file  Social Connections: Not on file  Intimate Partner Violence: Not on file    Objective:   Today's Vitals: BP 136/74   Pulse 96   Ht 5\' 11"  (1.803 m)   Wt 161 lb 14.4 oz (73.4 kg)   SpO2 95%   BMI 22.58 kg/m   Physical Exam  45 year old male in no acute distress Cardiovascular exam with regular rate and rhythm, no murmurs appreciated Lungs clear to auscultation  bilaterally  Assessment & Plan:   Problem List Items Addressed This Visit       Cardiovascular and Mediastinum   Paroxysmal tachycardia ([place]) - Primary    Continue with evaluation as per cardiology including Holter monitor, echocardiogram         Other   Transaminitis    Elevation on labs at recent ED visit Will check hepatitis panel, transferrin, right upper quadrant ultrasound       Macrocytic anemia    Uncertain etiology, normal B12 in the ED Will check folate       Elevated alkaline phosphatase level    Will check GGT to determine if source of elevation is intra or extrahepatic Further work-up pending result of GGT Right upper quadrant ultrasound as per above       Patient with notable laboratory abnormalities, uncertain etiology.  Clinical picture suggests possible alcohol use as underlying cause, however patient only reporting about 6 drinks per week which does not seem to be a significant enough amount to be causing abnormalities.  Outpatient Encounter Medications as of 05/27/2021  Medication Sig   diphenhydrAMINE (BENADRYL) 25 mg capsule Take 25 mg by mouth daily as needed for allergies.   ibuprofen (ADVIL) 200 MG tablet Take 400 mg by mouth every 6 (six) hours as needed for mild pain.   No facility-administered encounter medications on file as of 05/27/2021.   Spent 60 minutes on this patient encounter, including preparation, chart review, face-to-face counseling with patient and coordination of care, and documentation of encounter  Follow-up: Return in about 6 weeks (around 07/08/2021) for Follow Up.   Ersa Delaney J De [place], MD

## 2021-05-27 NOTE — Patient Instructions (Signed)
  Medication Instructions:  Your physician recommends that you continue on your current medications as directed. Please refer to the Current Medication list given to you today. --If you need a refill on any your medications before your next appointment, please call your pharmacy first. If no refills are authorized on file call the office.--  Lab Work: Your physician has recommended that you have lab work today: Iron, Ferritin, TIBC, Hep B, and Hep C If you have labs (blood work) drawn today and your tests are completely normal, you will receive your results via [place] a phone call from our staff.  Please ensure you check your voicemail in the event that you authorized detailed messages to be left on a delegated number. If you have any lab test that is abnormal or we need to change your treatment, we will call you to review the results.  Referrals/Procedures/Imaging: Your physician recommends that you have an ultrasound.    1. You may have this done at the [facility], located in the [place] on the 1st floor.    2. You do no have to have an appointment.    3. [place], [place] [address]        [phone]        Monday - Friday  8:00 am - 5:00 pm 4. [facility] at [facility]  Imaging at [facility] is a premier diagnostic imaging center that offers a 64-slice CT scanner and interventional radiology services.   Follow-Up: Your next appointment:   Your physician recommends that you schedule a follow-up appointment in: 65 WEEKS with Dr. de [place]  Thanks for letting us be apart of your health journey!!  Primary Care and Sports Medicine   Dr. Arlina Robes [place]   We encourage you to activate your patient portal called "MyChart".  Sign up information is provided on this After Visit Summary.  MyChart is used to connect with patients for Virtual Visits  (Telemedicine).  Patients are able to view lab/test results, encounter notes, upcoming appointments, etc.  Non-urgent messages can be sent to your provider as well. To learn more about what you can do with MyChart, please visit --  [email or web address].

## 2021-05-27 NOTE — Assessment & Plan Note (Signed)
Continue with evaluation as per cardiology including Holter monitor, echocardiogram

## 2021-05-27 NOTE — Assessment & Plan Note (Signed)
Uncertain etiology, normal B12 in the ED Will check folate

## 2021-05-27 NOTE — Assessment & Plan Note (Signed)
Elevation on labs at recent ED visit Will check hepatitis panel, transferrin, right upper quadrant ultrasound

## 2021-05-28 ENCOUNTER — Ambulatory Visit (HOSPITAL_COMMUNITY)
Admission: RE | Admit: 2021-05-28 | Discharge: 2021-05-28 | Disposition: A | Discharge status: Home / Self Care | Payer: Managed Care, Other (non HMO) | Source: Ambulatory Visit | Attending: Cardiology | Admitting: Cardiology

## 2021-05-28 ENCOUNTER — Ambulatory Visit (INDEPENDENT_AMBULATORY_CARE_PROVIDER_SITE_OTHER): Payer: Managed Care, Other (non HMO)

## 2021-05-28 DIAGNOSIS — R42 Dizziness and giddiness: Secondary | ICD-10-CM

## 2021-05-28 DIAGNOSIS — I83893 Varicose veins of bilateral lower extremities with other complications: Secondary | ICD-10-CM | POA: Diagnosis not present

## 2021-05-28 DIAGNOSIS — I479 Paroxysmal tachycardia, unspecified: Secondary | ICD-10-CM | POA: Diagnosis not present

## 2021-05-28 DIAGNOSIS — R000 Tachycardia, unspecified: Secondary | ICD-10-CM | POA: Diagnosis not present

## 2021-05-31 ENCOUNTER — Telehealth: Payer: Self-pay | Admitting: Cardiology

## 2021-05-31 DIAGNOSIS — I83893 Varicose veins of bilateral lower extremities with other complications: Secondary | ICD-10-CM

## 2021-05-31 NOTE — Telephone Encounter (Signed)
This RN called patient back, relayed the following from Dr. Ellyn Hack:   Leonie Man, MD  05/28/2021 10:29 PM EDT      Lower extremity venous Dopplers:   No evidence of deep vein blood clots/DVT or peripheral clots.   There is indeed evidence of Venous Reflux noted in the  Right Greater Saphenous Vein (a peripheral vein) & on the Left - both the deep (common) Femoral Vein &the Greater saphenous vein in both the thigh & calf.   Based upon his Symptoms - Definitely recommend Moderate Weight CompressionStockings & foot elevation.   Depending on his symptoms - (& his desires) we can refer to Vascular Sgx (Dr. Donzetta Matters or Dr Carlis Abbott - or whomever is taking over for Dr. Donnetta Hutching managing VenousReflux)    Glenetta Hew, MD    Patient verbalized understanding, pt would like referral to vascular surgery. This RN placed orders per Dr. Allison Quarry instruction. This RN told patient should he experience chest pain, shortness of breath, or any other concerning symptoms in the interim he should be seen at the emergency department. Pt verbalized understanding, all questions/concerns addressed at this time.

## 2021-05-31 NOTE — Telephone Encounter (Signed)
Patient calling to discuss his vascular test results.

## 2021-06-03 LAB — HEPATITIS B CORE ANTIBODY, IGM: Hep B C IgM: NEGATIVE

## 2021-06-03 LAB — HEPATITIS B SURFACE ANTIBODY,QUALITATIVE: Hep B Surface Ab, Qual: NONREACTIVE

## 2021-06-03 LAB — HEPATITIS B SURFACE ANTIGEN: Hepatitis B Surface Ag: NEGATIVE

## 2021-06-03 LAB — HEPATITIS C ANTIBODY: Hep C Virus Ab: <0.1 s/co ratio (ref 0.0–0.9)

## 2021-06-03 LAB — FOLATE: Folate: 3.4 ng/mL (ref >3.0)

## 2021-06-03 LAB — IRON AND TIBC
Iron Saturation: 44 % (ref 15–55)
Iron: 119 ug/dL (ref 38–169)
Total Iron Binding Capacity: 272 ug/dL (ref 250–450)
UIBC: 153 ug/dL (ref 111–343)

## 2021-06-03 LAB — GAMMA GT: GGT: 659 IU/L (ref 0–65)

## 2021-06-03 LAB — SPECIMEN STATUS REPORT

## 2021-06-04 ENCOUNTER — Other Ambulatory Visit (HOSPITAL_COMMUNITY): Payer: Managed Care, Other (non HMO)

## 2021-06-07 ENCOUNTER — Ambulatory Visit (HOSPITAL_COMMUNITY): Payer: Managed Care, Other (non HMO) | Attending: Internal Medicine

## 2021-06-07 ENCOUNTER — Other Ambulatory Visit: Payer: Self-pay

## 2021-06-07 DIAGNOSIS — R42 Dizziness and giddiness: Secondary | ICD-10-CM | POA: Diagnosis not present

## 2021-06-07 DIAGNOSIS — I479 Paroxysmal tachycardia, unspecified: Secondary | ICD-10-CM | POA: Diagnosis not present

## 2021-06-07 DIAGNOSIS — R000 Tachycardia, unspecified: Secondary | ICD-10-CM | POA: Diagnosis not present

## 2021-06-07 LAB — ECHOCARDIOGRAM COMPLETE
Area-P 1/2: 4.8 cm2
S' Lateral: 2.6 cm

## 2021-06-09 ENCOUNTER — Telehealth (HOSPITAL_BASED_OUTPATIENT_CLINIC_OR_DEPARTMENT_OTHER): Payer: Self-pay

## 2021-06-09 NOTE — Telephone Encounter (Signed)
Left message for patient to call back for results and recommendations. 

## 2021-06-09 NOTE — Telephone Encounter (Signed)
-----   Message from Raymond J de [place]am, MD sent at 06/04/2021 10:58 AM EDT ----- Testing for hepatitis B and C are both negative.  Normal iron studies.  Notable elevation in GGT which indicates that elevation in alkaline phosphatase on prior labs is likely of liver/gallbladder origin.  Awaiting results of right upper quadrant ultrasound.  Results of this ultrasound will dictate next steps in management/evaluation

## 2021-06-09 NOTE — Telephone Encounter (Signed)
Patient is aware and agreeable to lab results and recommendations His [place] is scheduled and he will await recommendations upon completion of imaging

## 2021-06-11 ENCOUNTER — Telehealth: Payer: Managed Care, Other (non HMO) | Admitting: Cardiology

## 2021-06-21 ENCOUNTER — Ambulatory Visit (INDEPENDENT_AMBULATORY_CARE_PROVIDER_SITE_OTHER): Payer: Managed Care, Other (non HMO) | Admitting: Neurology

## 2021-06-21 ENCOUNTER — Other Ambulatory Visit: Payer: Self-pay

## 2021-06-21 ENCOUNTER — Encounter: Payer: Self-pay | Admitting: Neurology

## 2021-06-21 VITALS — BP 134/79 | HR 94 | Ht 71.0 in | Wt 165.5 lb

## 2021-06-21 DIAGNOSIS — R29898 Other symptoms and signs involving the musculoskeletal system: Secondary | ICD-10-CM

## 2021-06-21 DIAGNOSIS — R292 Abnormal reflex: Secondary | ICD-10-CM

## 2021-06-21 NOTE — Progress Notes (Signed)
[facility] Clinic Note - Initial Visit   Date: 06/21/21  Jacob Davila MRN: [id number] DOB: 08-18-1976   Dear Dr. Billy Fischer:  Thank you for your kind referral of Jacob Davila for consultation of leg weakness. Although his history is well known to you, please allow [place] to reiterate it for the purpose of our medical record. The patient was accompanied to the clinic by self.    History of Present Illness: Jacob Davila is a 45 y.o. right-handed male with history of scoliosis surgery presenting for evaluation of left leg weakness. Clear history is difficult to obtain.  Starting in May 2022, he began having tingling from the waist down into this feet.  Symptoms are worse when he is on his feet.  He also has weakness in the left leg.  No weakness in the right leg. He also complains of generalized fatigue and leg swelling.  He went to the ER in May for shortness of breath, palpitations, leg swelling and weakness.  He saw cardiology who recommended compression stockings for venostasis, which helped.    He works as a Personnel officer.  He drinks 2-3 beers on his days off.  Lives alone.  Out-side paper records, electronic medical record, and images have been reviewed where available and summarized as:  Lab Results  Component Value Date   VITAMINB12 416 04/19/2021   Lab Results  Component Value Date   TSH 1.632 04/19/2021    Past Medical History:  Diagnosis Date   Scoliosis    - s/p correctvie Sgx    Past Surgical History:  Procedure Laterality Date   BACK SURGERY  03/1990   Scoliosis     Medications:  Outpatient Encounter Medications as of 06/21/2021  Medication Sig   diphenhydrAMINE (BENADRYL) 25 mg capsule Take 25 mg by mouth daily as needed for allergies.   ibuprofen (ADVIL) 200 MG tablet Take 400 mg by mouth every 6 (six) hours as needed for mild pain.   No facility-administered encounter medications on file as of 06/21/2021.    Allergies:   Allergies  Allergen Reactions   Darvon [Propoxyphene]    Tessalon [Benzonatate]    Latex Rash   Morphine And Related Rash    Family History: Family History  Problem Relation Age of Onset   Atrial fibrillation Mother    Heart failure Mother    Hypertension Father    Hyperlipidemia Father    CAD Paternal Grandfather        noted to have Angina (not aware of details)    Social History: Social History   Tobacco Use   Smoking status: Some Days    Types: Cigars   Smokeless tobacco: Never  Vaping Use   Vaping Use: Never used  Substance Use Topics   Alcohol use: Not Currently   Drug use: Never   Social History   Social History Narrative   Not on file    Vital Signs:  BP 134/79   Pulse 94   Ht '5\' 11"'$  (1.803 m)   Wt 165 lb 8 oz (75.1 kg)   SpO2 97%   BMI 23.08 kg/m   Neurological Exam: MENTAL STATUS including orientation to time, place, person, recent and remote memory intact.  Appears somnolent, tangential thought process.  Speech is not dysarthric.  CRANIAL NERVES: II:  No visual field defects.    III-IV-VI: Pupils equal round and reactive to light.  Normal conjugate, extra-ocular eye movements in all directions of gaze.  No nystagmus.  No ptosis.   V:  Normal facial sensation.    VII:  Normal facial symmetry and movements.   VIII:  Normal hearing and vestibular function.   IX-X:  Normal palatal movement.   XI:  Normal shoulder shrug and head rotation.   XII:  Normal tongue strength and range of motion, no deviation or fasciculation.  MOTOR:  No atrophy, fasciculations or abnormal movements.  No pronator drift.   Upper Extremity:  Right  Left  Deltoid  5/5   5/5   Biceps  5/5   5/5   Triceps  5/5   5/5   Infraspinatus 5/5  5/5  Medial pectoralis 5/5  5/5  Wrist extensors  5/5   5/5   Wrist flexors  5/5   5/5   Finger extensors  5/5   5/5   Finger flexors  5/5   5/5   Dorsal interossei  5/5   5/5   Abductor pollicis  5/5   5/5   Tone (Ashworth scale)   0  0   Lower Extremity:  Right  Left  Hip flexors  5/5   4/5   Hip extensors  5/5   5/5   Adductor 5/5  4/5  Abductor 5/5  5/5  Knee flexors  5/5   5/5   Knee extensors  5/5   4/5   Dorsiflexors  5/5   5/5   Plantarflexors  5/5   5/5   Toe extensors  5/5   5/5   Toe flexors  5/5   5/5   Tone (Ashworth scale)  0  0   MSRs:  Right        Left                  brachioradialis 2+  2+  biceps 2+  2+  triceps 2+  2+  patellar 2+  3+  ankle jerk 1+  1+  Hoffman no  no  plantar response down  down   SENSORY:  Reducued vibration, pin prick, and temperature involving the entire left leg; sensation intact in the arms and right leg.  COORDINATION/GAIT: Normal finger-to- nose-finger.  Intact rapid alternating movements bilaterally.  Antalgic gait, unassisted.   IMPRESSION: Left leg weakness, possibly due to lumbosacral canal stenosis.  Exam shows asymmetric brisk patella reflex suggestive of stenosis at L4 nerve root level.  - MRI lumbar spine wo contrast  - Consider NCS/EMG of the legs, if imaging is not diagnostic    Thank you for allowing me to participate in patient's care.  If I can answer any additional questions, I would be pleased to do so.    Sincerely,    Gissel Keilman K. Posey Pronto, DO

## 2021-06-21 NOTE — Patient Instructions (Addendum)
MRI lumbar spine without contrast.  We will call you with the results and decide the next step.

## 2021-06-22 ENCOUNTER — Ambulatory Visit (INDEPENDENT_AMBULATORY_CARE_PROVIDER_SITE_OTHER): Payer: Managed Care, Other (non HMO) | Admitting: Vascular Surgery

## 2021-06-22 ENCOUNTER — Encounter: Payer: Self-pay | Admitting: Vascular Surgery

## 2021-06-22 VITALS — BP 112/76 | HR 104 | Temp 97.4°F | Resp 18 | Ht 71.0 in | Wt 164.0 lb

## 2021-06-22 DIAGNOSIS — I83893 Varicose veins of bilateral lower extremities with other complications: Secondary | ICD-10-CM

## 2021-06-22 NOTE — Progress Notes (Signed)
Patient name: Jacob Davila MRN: [id number] DOB: 02-May-1976 Sex: male  REASON FOR CONSULT: Evaluate bilateral leg swelling  HPI: Jacob Davila is a 45 y.o. male, with no significant medical history that presents for evaluation of pain and swelling particularly in the left leg.  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  He is unclear about the timeline but he notes this has gotten significantly worse over the last several months.  He was evaluated by Dr. Ellyn Hack and a venous reflux study was ordered and he placed him in a knee-high 10 to 20 mm Hg compression stocking.  States this is not provided significant relief.  No history of DVT.  No trauma.  Past Medical History:  Diagnosis Date   Scoliosis    - s/p correctvie Sgx    Past Surgical History:  Procedure Laterality Date   BACK SURGERY  03/1990   Scoliosis    Family History  Problem Relation Age of Onset   Atrial fibrillation Mother    Heart failure Mother    Hypertension Father    Hyperlipidemia Father    CAD Paternal Grandfather        noted to have Angina (not aware of details)    SOCIAL HISTORY: Social History   Socioeconomic History   Marital status: Single    Spouse name: Not on file   Number of children: Not on file   Years of education: Not on file   Highest education level: Not on file  Occupational History   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    Comment: Merchandiser, retail  Tobacco Use   Smoking status: Some Days    Types: Cigars   Smokeless tobacco: Never  Vaping Use   Vaping Use: Never used  Substance and Sexual Activity   Alcohol use: Not Currently   Drug use: Never   Sexual activity: Not on file  Other Topics Concern   Not on file  Social History Narrative   Not on file   Social Determinants of Health   Financial Resource Strain: Not on file  Food Insecurity: Not on file  Transportation Needs: Not on file  Physical Activity: Not on file  Stress: Not on  file  Social Connections: Not on file  Intimate Partner Violence: Not on file    Allergies  Allergen Reactions   Darvon [Propoxyphene]    Tessalon [Benzonatate]    Latex Rash   Morphine And Related Rash    Current Outpatient Medications  Medication Sig Dispense Refill   diphenhydrAMINE (BENADRYL) 25 mg capsule Take 25 mg by mouth daily as needed for allergies.     ibuprofen (ADVIL) 200 MG tablet Take 400 mg by mouth every 6 (six) hours as needed for mild pain.     No current facility-administered medications for this visit.    REVIEW OF SYSTEMS:  '[X]'$  denotes positive finding, '[ ]'$  denotes negative finding Cardiac  Comments:  Chest pain or chest pressure:    Shortness of breath upon exertion:    Short of breath when lying flat:    Irregular heart rhythm:        Vascular    Pain in calf, thigh, or hip brought on by ambulation:    Pain in feet at night that wakes you up from your sleep:     Blood clot in your veins:    Leg swelling:  x       Pulmonary  Oxygen at home:    Productive cough:     Wheezing:         Neurologic    Sudden weakness in arms or legs:     Sudden numbness in arms or legs:     Sudden onset of difficulty speaking or slurred speech:    Temporary loss of vision in one eye:     Problems with dizziness:         Gastrointestinal    Blood in stool:     Vomited blood:         Genitourinary    Burning when urinating:     Blood in urine:        Psychiatric    Major depression:         Hematologic    Bleeding problems:    Problems with blood clotting too easily:        Skin    Rashes or ulcers:        Constitutional    Fever or chills:      PHYSICAL EXAM: Vitals:   06/22/21 1538  BP: 112/76  Pulse: (!) 104  Resp: 18  Temp: (!) 97.4 F (36.3 C)  TempSrc: Temporal  SpO2: 95%  Weight: 164 lb (74.4 kg)  Height: '5\' 11"'$  (1.803 m)    GENERAL: The patient is a well-nourished male, in no acute distress. The vital signs are documented  above. CARDIAC: There is a regular rate and rhythm.  VASCULAR:  Palpable DP/PT pulses bilaterally No skin thickening or lipodermatosclerosis No open ulcerations PULMONARY: No respiratory distress. ABDOMEN: Soft and non-tender. MUSCULOSKELETAL: There are no major deformities or cyanosis. NEUROLOGIC: No focal weakness or paresthesias are detected. SKIN: There are no ulcers or rashes noted. PSYCHIATRIC: The patient has a normal affect.  DATA:   Indications: Bilateral leg swelling for about 5-6 months. He denies chest  pain and SOB. He has noticeably superficial varicosities in both medial  aspects of feet with discoloration on both lower calves. family history of  varicose veins-mother and patient   stands for long periods of time on his job.      Risk Factors: Patient became lightheaded during the left lower extremity  reflux testing. Patient was placed in Trendelenburg position for 10  minutes. He recovered and was able to finish the exam.   Anticoagulation: None.  Comparison Study: None   Performing Technologist: Alecia Mackin RVT, RDCS (AE), RDMS      Examination Guidelines: A complete evaluation includes B-mode imaging,  spectral  Doppler, color Doppler, and power Doppler as needed of all accessible  portions  of each vessel. Bilateral testing is considered an integral part of a  complete  examination. Limited examinations for reoccurring indications may be  performed  as noted. The reflux portion of the exam is performed with the patient in  reverse Trendelenburg.  Significant venous reflux is defined as >500 ms in the superficial venous  system, and >1 second in the deep venous system.      Venous Reflux Times  +--------------+---------+------+-----------+------------+--------+  RIGHT         Reflux NoRefluxReflux TimeDiameter cmsComments                          Yes                                    +--------------+---------+------+-----------+------------+--------+  CFV                     yes    506 ms                         +--------------+---------+------+-----------+------------+--------+  FV prox       no                                              +--------------+---------+------+-----------+------------+--------+  FV mid                  yes    272 ms                         +--------------+---------+------+-----------+------------+--------+  FV dist       no                                              +--------------+---------+------+-----------+------------+--------+  Popliteal               yes    211 ms                         +--------------+---------+------+-----------+------------+--------+  GSV at SFJ    no                            .57               +--------------+---------+------+-----------+------------+--------+  GSV prox thighno                            .51               +--------------+---------+------+-----------+------------+--------+  GSV mid thigh no                            .42               +--------------+---------+------+-----------+------------+--------+  GSV dist thigh          yes    >500 ms      .31               +--------------+---------+------+-----------+------------+--------+  GSV at knee   no                            .40               +--------------+---------+------+-----------+------------+--------+  GSV prox calf                               .27               +--------------+---------+------+-----------+------------+--------+  GSV mid calf                                .25               +--------------+---------+------+-----------+------------+--------+  SSV Pop Fossa no                            .26               +--------------+---------+------+-----------+------------+--------+  SSV prox calf                               .23                +--------------+---------+------+-----------+------------+--------+      +--------------+--------+------+----------+-----------+--------------------  ---+  LEFT          Reflux  Reflux  Reflux   Diameter  Comments                                 No       Yes     Time       cms                               +--------------+--------+------+----------+-----------+--------------------  ---+  CFV                    yes  >1 second                                       +--------------+--------+------+----------+-----------+--------------------  ---+  FV prox       no                                                            +--------------+--------+------+----------+-----------+--------------------  ---+  FV mid        no                                                            +--------------+--------+------+----------+-----------+--------------------  ---+  FV dist       no                                                            +--------------+--------+------+----------+-----------+--------------------  ---+  Popliteal     no                                                            +--------------+--------+------+----------+-----------+--------------------  ---+  GSV at SFJ    no                          .58                               +--------------+--------+------+----------+-----------+--------------------  ---+  GSV prox thighno                          .50                               +--------------+--------+------+----------+-----------+--------------------  ---+  GSV mid thigh          yes   >500 ms      .49                               +--------------+--------+------+----------+-----------+--------------------  ---+  GSV dist thigh         yes   >500 ms      .53                               +--------------+--------+------+----------+-----------+--------------------   ---+  GSV at knee            yes   >500 ms      .53                               +--------------+--------+------+----------+-----------+--------------------  ---+  GSV prox calf          yes   >500 ms      .44    perforator + reflux                                                        .2cm                       +--------------+--------+------+----------+-----------+--------------------  ---+  GSV mid calf           yes   >500 ms      .32    perforator + reflux                                                        .48cm                     +--------------+--------+------+----------+-----------+--------------------  ---+  SSV Pop Fossa no                          .32                               +--------------+--------+------+----------+-----------+--------------------  ---+           Summary:  Bilateral:  - No evidence of deep vein thrombosis seen in the lower extremities,  bilaterally, from the common femoral through the popliteal veins.     Right:  - Venous reflux is noted in the right greater saphenous vein in the calf.     Left:  - Venous reflux is noted in the left common femoral vein.  - Venous reflux is noted in  the left greater saphenous vein in the thigh.  - Venous reflux is noted in the left greater saphenous vein in the calf.  - Venous reflux is noted in the left perforator vein.     *See table(s) above for measurements and observations.   Assessment/Plan:  45 year old male presents with bilateral lower extremity venous insufficiency with CEAP classification C3.  His symptoms are predominantly in the left leg where he has pretty profound swelling and discomfort throughout the day particularly worse at the end of the day.  In addition to deep reflux he has fairly long segment superficial reflux from the mid thigh all the way down to the mid calf.  Discussed with him that we could certainly get him in a thigh-high  stocking today with a tighter compression rating of 20 to 30 mmHg.  Discussed the importance of leg elevation.  In addition I will bring him back in 3 months to see one of my partners and see if he would be a candidate for any laser ablation given the size of the vein above the knee.     Marty Heck, MD [facility] Office: [phone]

## 2021-06-24 ENCOUNTER — Other Ambulatory Visit: Payer: Self-pay

## 2021-06-24 ENCOUNTER — Ambulatory Visit
Admission: RE | Admit: 2021-06-24 | Discharge: 2021-06-24 | Disposition: A | Discharge status: Home / Self Care | Payer: Managed Care, Other (non HMO) | Source: Ambulatory Visit | Attending: Family Medicine | Admitting: Family Medicine

## 2021-06-24 DIAGNOSIS — R7401 Elevation of levels of liver transaminase levels: Secondary | ICD-10-CM

## 2021-06-24 DIAGNOSIS — R748 Abnormal levels of other serum enzymes: Secondary | ICD-10-CM

## 2021-07-01 ENCOUNTER — Ambulatory Visit (INDEPENDENT_AMBULATORY_CARE_PROVIDER_SITE_OTHER): Payer: Managed Care, Other (non HMO) | Admitting: Cardiology

## 2021-07-01 ENCOUNTER — Encounter: Payer: Self-pay | Admitting: Cardiology

## 2021-07-01 ENCOUNTER — Other Ambulatory Visit: Payer: Self-pay

## 2021-07-01 VITALS — BP 120/68 | HR 80 | Ht 71.0 in | Wt 164.2 lb

## 2021-07-01 DIAGNOSIS — I83893 Varicose veins of bilateral lower extremities with other complications: Secondary | ICD-10-CM | POA: Diagnosis not present

## 2021-07-01 DIAGNOSIS — R42 Dizziness and giddiness: Secondary | ICD-10-CM | POA: Diagnosis not present

## 2021-07-01 DIAGNOSIS — I479 Paroxysmal tachycardia, unspecified: Secondary | ICD-10-CM | POA: Diagnosis not present

## 2021-07-01 DIAGNOSIS — R000 Tachycardia, unspecified: Secondary | ICD-10-CM | POA: Diagnosis not present

## 2021-07-01 NOTE — Progress Notes (Signed)
Primary Care Provider: [place], Blondell Reveal, MD Cardiologist: Glenetta Hew, MD Electrophysiologist: None  Clinic Note: Chief Complaint  Patient presents with   Follow-up    Test results.  Monitor just resulted today.   Leg Swelling    After venous Dopplers, went to see vascular surgeons.  Has pending evaluation by Dr. Scot Dock    ===================================  ASSESSMENT/PLAN   Problem List Items Addressed This Visit       Cardiology Problems   Paroxysmal tachycardia [facility])    Relatively benign monitor results.  His episodes really did not occur while he was wearing the monitor which explains that.  These are not happening with enough frequency to capture on an event monitor.  I therefore discussed potentially using a smart watch or Kardia (portable monitor APP for smart phone).   This way, he can capture an episode that would not be LV capture otherwise.  We can know what we are treating.  Discussed adequate hydration and avoiding triggers.  Also discussed vagal maneuvers.      Varicose veins of leg with swelling, bilateral (Chronic)    Results confirmed on lower extremity venous Dopplers.  Was shifted from knee-high to thigh-high support socks by vascular surgery. Holding off on diuretic for now.  He has a pending follow-up visit with Dr. Scot Dock to consider the possibility of endovascular ablation.  We also provided information about Dr. Nicole Cella patented leg elevation pillow. [facility] Doctor)        Other   Sinus tachycardia - Primary    Overall, his heart rate was pretty fast to begin with.  He did have some normal heart rate ranges but mostly in the 90s to low 100s.  It seems unusual with something that is edema to say increase hydration, but I did explain to him that he needs to maintain adequate hydration.  Use of the support stockings will help prevent this from pooling as edema.      Dizziness    Associate with tachycardia but may be more because of  discomfort.  Now that he is wearing support socks and, at that legs not swollen.  He is increasing his intravascular volume and probably not as dizzy.      ===================================  HPI:    Jacob Davila is a 45 y.o. male with only prior history notable for surgery to repair scoliosis who is being seen today for f/u evaluation of SHORTNESS OF BREATH, PALPITATIONS, LEG SWELLING/VARICOSE VEINS, DIZZINESS AND EXTREME FATIGUE.  Initially seen at the request of [place], Blondell Reveal, MD.  Karl Luke was @ [facility] & transferred to the ER 5/30-31/2022 -> he had concerns for significant leg swelling with tingling as well as weakness shortness of breath and palpitations.  He noticed several weeks of worsening swelling, but on the day of presentation 7 they were exposed lawn up like balloons and very painful with tingling/aching.  Putting them up did help some but it really was not all you have gone by the next morning.  He felt associated fatigue and leg weakness.  He also just felt in general very poorly for the last several days leading up to his ER visit-noted generalized weakness and also severe heart racing.  Heart racing associated with profound shortness of breath worse with lying flat and ambulation..  I saw him for ER follow-up on May 17, 2021. ->  He had not had a spell as bad as what took him to the ER.  He was still noticing that his legs continue to get very swollen and painful.  Tender with a tingling sensation all over.  Felt like his knee legs would pop.  Also noted heart rate going up and down rapidly.  Associate with some lightheadedness and dizziness.  Fluttering sensations lasting 2 to 3 minutes.  Had lightheadedness but no syncope or near syncope.. Cardiovascular ROS: positive for - chest pain, edema, irregular heartbeat, palpitations, rapid heart rate, shortness of breath, and lightheaded dizziness with near syncope negative for - loss of consciousness,  orthopnea, paroxysmal nocturnal dyspnea, or TIA/amaurosis fugax, claudication -> Venous Dopplers ordered along with 2D echo, and event monitor --> Based on venous Doppler results, he was referred to vascular surgery where he was seen by Dr. Carlis Abbott.  Subsequently referred to Dr. Scot Dock for potential venous ablation.  Placed on thigh-high support hose.  Recent Hospitalizations:   Reviewed  CV studies:    The following studies were reviewed today: (if available, images/films reviewed: From Epic Chart or Care Everywhere) Lower extremity venous Doppler 05/29/2021: No evidence of DVT.  Venous reflux noted in the right GSV in the calf.  Venous reflux noted in the left common femoral vein and left greater saphenous vein in the thigh.  Left GSV in the calf as well as a left perforator vein. => Forwarded to vascular surgery: 2D Echo 06/08/2019: EF 60-65%.  Normal wall motion.  Normal relaxation.  Mild aortic valve thickening but no sclerosis/stenosis.  Normal echo Event Monitor worn from 7/8-8/6 2022 Baseline rhythm was sinus rhythm: Minimum heart rate 63 bpm, max heart rate 158 bpm, average 94 bpm. Rare(<1%) PACs and PVCs noted. Sinus tachycardia noted but no arrhythmias noted. No tachyarrhythmias or bradycardia arrhythmias noted. (No atrial fibrillation, atrial flutter, supraventricular tachycardia, paroxysmal atrial tachycardia or ventricular tachycardia). No bradycardia noted. No patient triggered events.   Pretty normal study.  Shows normal sinus rhythm that can go fast.  No slow rhythms.  No abnormal rhythms.    Interval History:   [name] presents here today for follow-up.  He says he is doing a whole lot better.  His legs still hurt but are doing much better with the support stockings.  He is also elevating his feet as best he can.  Now is really bothering him is his left hip.  He thinks he injured it has an MRI pending.  He still feels a few palpitations here and there but no prolonged  rapid heart rates.  No further episodes of chest pain or pressure.  I think a lot of the symptoms he was having were related to anxiety and now that his legs are feeling better, the lightheadedness and shortness of breath are much better.  No more near syncope.  CV Review of Symptoms (Summary) Cardiovascular ROS: no chest pain or dyspnea on exertion positive for - edema, irregular heartbeat, palpitations, and still gets lightheaded and dizzy, but now near syncope.  Legs still ache, but much better. negative for - loss of consciousness, orthopnea, paroxysmal nocturnal dyspnea, rapid heart rate, shortness of breath, or TIA/amaurosis fugax, claudication  REVIEWED OF SYSTEMS   Review of Systems  Constitutional:  Positive for malaise/fatigue (Now is probably more just because he is tired from his off shift working.). Negative for chills, fever and weight loss.  HENT:  Negative for congestion and nosebleeds.   Respiratory:  Negative for cough and shortness of breath (With the tachycardia).   Cardiovascular:        Per  HPI  Gastrointestinal:  Negative for blood in stool and melena.  Genitourinary:  Negative for hematuria.  Musculoskeletal:  Positive for myalgias (Legs do not ache nearly as bad.). Negative for joint pain.  Neurological:  Positive for tingling (Tingling in his legs has much improved.). Negative for dizziness and weakness (Leg still feel heavy and weak, but much better).  Psychiatric/Behavioral:  Negative for memory loss. The patient is not nervous/anxious (Feels much better now much less anxious.) and does not have insomnia.    I have reviewed and (if needed) personally updated the patient's problem list, medications, allergies, past medical and surgical history, social and family history.   PAST MEDICAL HISTORY   Past Medical History:  Diagnosis Date   Scoliosis    - s/p correctvie Sgx   Venous insufficiency of both lower extremities 05/2021   Lower extremity venous Doppler  05/29/2021: No evidence of DVT.  Venous reflux noted in the right GSV in the calf.  Venous reflux noted in the left common femoral vein and left greater saphenous vein in the thigh.  Left GSV in the calf as well as a left perforator vein. => Forwarded to vascular surgery:    PAST SURGICAL HISTORY   Past Surgical History:  Procedure Laterality Date   BACK SURGERY  03/1990   Scoliosis   TRANSTHORACIC ECHOCARDIOGRAM  06/08/2019   EF 60-65%.  Normal wall motion.  Normal relaxation.  Mild aortic valve thickening but no sclerosis/stenosis.  Normal echo    There is no immunization history on file for this patient.  MEDICATIONS/ALLERGIES   Current Meds  Medication Sig   diphenhydrAMINE (BENADRYL) 25 mg capsule Take 25 mg by mouth daily as needed for allergies.   ibuprofen (ADVIL) 200 MG tablet Take 400 mg by mouth every 6 (six) hours as needed for mild pain.    Allergies  Allergen Reactions   Darvon [Propoxyphene]    Tessalon [Benzonatate]    Latex Rash   Morphine And Related Rash    SOCIAL HISTORY/FAMILY HISTORY   Reviewed in Epic:  Pertinent findings:  Social History   Tobacco Use   Smoking status: Some Days    Types: Cigars   Smokeless tobacco: Never  Vaping Use   Vaping Use: Never used  Substance Use Topics   Alcohol use: Not Currently   Drug use: Never   Social History   Social History Narrative   Reports he does smoke cigarettes, but denies alcohol use or use of other drugs.  He reports the only medication he takes is Benadryl, and that he takes approximately 2 tablets/day.  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  He has never had symptoms like this before.      OBJCTIVE -PE, EKG, labs   Wt Readings from Last 3 Encounters:  07/01/21 164 lb 3.2 oz (74.5 kg)  06/22/21 164 lb (74.4 kg)  06/21/21 165 lb 8 oz  (75.1 kg)    Physical Exam: BP 120/68 (BP Location: Left Arm)   Pulse 80   Ht '5\' 11"'$  (1.803 m)   Wt 164 lb 3.2 oz (74.5 kg)   SpO2 97%   BMI 22.90 kg/m  Physical Exam Vitals reviewed.  Constitutional:      General: He is not in acute distress.    Appearance: Normal appearance. He is normal weight. He is  not toxic-appearing.     Comments: Healthy-appearing gentleman.  He is a little bit of discomfort from his hip, but seems like he feels much better.  Well-nourished well-groomed.  HENT:     Head: Normocephalic and atraumatic.  Neck:     Vascular: No JVD.  Cardiovascular:     Rate and Rhythm: Normal rate and regular rhythm. No extrasystoles are present.    Chest Wall: PMI is not displaced.     Pulses: Normal pulses.     Heart sounds: Murmur (Soft 1/6 systolic murmur.) heard.    No friction rub. No gallop.  Pulmonary:     Effort: Pulmonary effort is normal.     Breath sounds: Normal breath sounds.  Chest:     Chest wall: No tenderness.  Musculoskeletal:        General: Swelling (1+ bilateral edema.  Thigh-high stockings in place.) present.  Skin:    General: Skin is warm and dry.     Comments: Bilateral ankles have prominent spider nevi with small varicose veins and mild discoloration of the ankles and feet.  Neurological:     General: No focal deficit present.     Mental Status: He is alert and oriented to person, place, and time.     Motor: No weakness.     Gait: Gait normal.  Psychiatric:        Mood and Affect: Mood normal.        Behavior: Behavior normal.        Thought Content: Thought content normal.        Judgment: Judgment normal.     Comments: Much more relaxed     Adult ECG Report N/A   Recent Labs: Labs reviewed No results found for: CHOL, HDL, LDLCALC, LDLDIRECT, TRIG, CHOLHDL Lab Results  Component Value Date   CREATININE 1.23 04/19/2021   BUN 11 04/19/2021   NA 138 04/19/2021   K 3.2 (L) 04/19/2021   CL 102 04/19/2021   CO2 26 04/19/2021    CBC Latest Ref Rng & Units 04/19/2021 04/19/2021  WBC 4.0 - 10.5 K/uL - 6.1  Hemoglobin 13.0 - 17.0 g/dL 15.0 12.5(L)  Hematocrit 39.0 - 52.0 % 44.0 37.5(L)  Platelets 150 - 400 K/uL - 183    Lab Results  Component Value Date   TSH 1.632 04/19/2021    ==================================================  COVID-19 Education: The signs and symptoms of COVID-19 were discussed with the patient and how to seek care for testing (follow up with PCP or arrange E-visit).    I spent a total of 28 minutes with the patient spent in direct patient consultation.  Additional time spent with chart review  / charting (studies, outside notes, etc): 16 min Total Time: 44  min  Current medicines are reviewed at length with the patient today.  (+/- concerns) n/a  This visit occurred during the SARS-CoV-2 public health emergency.  Safety protocols were in place, including screening questions prior to the visit, additional usage of staff PPE, and extensive cleaning of exam room while observing appropriate contact time as indicated for disinfecting solutions.  Notice: This dictation was prepared with Dragon dictation along with smaller phrase technology. Any transcriptional errors that result from this process are unintentional and may not be corrected upon review.  Patient Instructions / Medication Changes & Studies & Tests Ordered   Patient Instructions  Medication Instructions:  No changes   *If you need a refill on your cardiac medications before your next appointment, please call your  pharmacy*   Lab Work: Not needed     Testing/Procedures:  Not needed  Follow-Up: At [facility], you and your health needs are our priority.  As part of our continuing mission to provide you with exceptional heart care, we have created designated Provider Care Teams.  These Care Teams include your primary Cardiologist (physician) and Advanced Practice Providers (APPs -  Physician Assistants and Nurse  Practitioners) who all work together to provide you with the care you need, when you need it.     Your next appointment:   6 month(s)  The format for your next appointment:   In Person  Provider:   Glenetta Hew, MD   Other Instructions  Avoid caffeine Drink plenty of water - keep  hydrated  Purchase (see pamphlet)  - the lounge doctor - either online  or possible purchase at [facility] long gift shop  Recommendations for vagal maneuvers: "Bearing down" Coughing Gagging Icy, cold towel on face or drink ice cold water       DR Axcel Horsch RECOMMENDS THAT YOU  PURCHASES  " Kardia" By [employer]. FROM THE  GOOGLE/ITUNE  APP PLAY STORE.   THE APP IS FREE , BUT THE  EQUIPMENT HAS A COST. IT IS A WAY FOR YOU TO OBTAIN A RECORDING OF YOUR HEART RATE . IT WILL BE A SHORT RHYTHM  STRIP THAT YOU CAN SHOW TO MEDICAL STAFF.  ALSO , YOU MAY [place] - EKG- MONITOR YOU ARE ABLE TO KEEP RECORDINGS ON YOUR SMARTPHONE , INSTEAD OF PURCHASING A SEPARATE EQUIPMENT   Dr Ellyn Hack  recommends portable  ECG monitor  to capture heart rate reading. There is a monitor that can be purchased on [employer]. The  monitor name is "EMAY"  at last checked it cost $79.  Our understanding there no more cost of that purchasing the machine. You will need a computer to be able to upload your recording and to be printed. It also come with a  USB to charge the monitor.     Studies Ordered:   No orders of the defined types were placed in this encounter.    Glenetta Hew, M.D., M.S. Interventional Cardiologist   Pager # [phone] Phone # [phone] [address] [place], [place] [address]   Thank you for choosing [facility] at [facility]!!

## 2021-07-01 NOTE — Patient Instructions (Signed)
Medication Instructions:  No changes   *If you need a refill on your cardiac medications before your next appointment, please call your pharmacy*   Lab Work: Not needed     Testing/Procedures:  Not needed  Follow-Up: At [facility], you and your health needs are our priority.  As part of our continuing mission to provide you with exceptional heart care, we have created designated Provider Care Teams.  These Care Teams include your primary Cardiologist (physician) and Advanced Practice Providers (APPs -  Physician Assistants and Nurse Practitioners) who all work together to provide you with the care you need, when you need it.     Your next appointment:   6 month(s)  The format for your next appointment:   In Person  Provider:   Glenetta Hew, MD   Other Instructions  Avoid caffeine Drink plenty of water - keep  hydrated  Purchase (see pamphlet)  - the lounge doctor - either online  or possible purchase at [facility] long gift shop  Recommendations for vagal maneuvers: "Bearing down" Coughing Gagging [place], cold towel on face or drink ice cold water       DR HARDING RECOMMENDS THAT YOU  PURCHASES  " Kardia" By [employer]. FROM THE  GOOGLE/[employer]  APP PLAY STORE.   THE APP IS FREE , BUT THE  EQUIPMENT HAS A COST. IT IS A WAY FOR YOU TO OBTAIN A RECORDING OF YOUR HEART RATE . IT WILL BE A SHORT RHYTHM  STRIP THAT YOU CAN SHOW TO MEDICAL STAFF.  ALSO , YOU MAY [place] - EKG- MONITOR YOU ARE ABLE TO KEEP RECORDINGS ON YOUR SMARTPHONE , INSTEAD OF PURCHASING A SEPARATE EQUIPMENT   Dr Ellyn Hack  recommends portable  ECG monitor  to capture heart rate reading. There is a monitor that can be purchased on [employer]. The  monitor name is "EMAY"  at last checked it cost $79.  Our understanding there no more cost of that purchasing the machine. You will need a computer to be able to upload your recording and to be printed. It also come with a  USB to charge  the monitor.

## 2021-07-02 ENCOUNTER — Encounter: Payer: Self-pay | Admitting: Cardiology

## 2021-07-02 NOTE — Assessment & Plan Note (Signed)
Associate with tachycardia but may be more because of discomfort.  Now that he is wearing support socks and, at that legs not swollen.  He is increasing his intravascular volume and probably not as dizzy.

## 2021-07-02 NOTE — Assessment & Plan Note (Addendum)
Relatively benign monitor results.  His episodes really did not occur while he was wearing the monitor which explains that.  These are not happening with enough frequency to capture on an event monitor.  I therefore discussed potentially using a smart watch or Kardia (portable monitor APP for smart phone).   This way, he can capture an episode that would not be LV capture otherwise.  We can know what we are treating.  Discussed adequate hydration and avoiding triggers.  Also discussed vagal maneuvers.

## 2021-07-02 NOTE — Assessment & Plan Note (Signed)
Results confirmed on lower extremity venous Dopplers.  Was shifted from knee-high to thigh-high support socks by vascular surgery. Holding off on diuretic for now.  He has a pending follow-up visit with Dr. Scot Dock to consider the possibility of endovascular ablation.  We also provided information about Dr. Nicole Cella patented leg elevation pillow. Publishing copy Doctor)

## 2021-07-02 NOTE — Assessment & Plan Note (Signed)
Overall, his heart rate was pretty fast to begin with.  He did have some normal heart rate ranges but mostly in the 90s to low 100s.  It seems unusual with something that is edema to say increase hydration, but I did explain to him that he needs to maintain adequate hydration.  Use of the support stockings will help prevent this from pooling as edema.

## 2021-07-08 ENCOUNTER — Other Ambulatory Visit: Payer: Self-pay

## 2021-07-08 ENCOUNTER — Encounter (HOSPITAL_BASED_OUTPATIENT_CLINIC_OR_DEPARTMENT_OTHER): Payer: Self-pay | Admitting: Family Medicine

## 2021-07-08 ENCOUNTER — Ambulatory Visit (HOSPITAL_BASED_OUTPATIENT_CLINIC_OR_DEPARTMENT_OTHER): Payer: Managed Care, Other (non HMO) | Admitting: Family Medicine

## 2021-07-08 VITALS — BP 114/68 | HR 94 | Ht 71.0 in | Wt 159.7 lb

## 2021-07-08 DIAGNOSIS — R29898 Other symptoms and signs involving the musculoskeletal system: Secondary | ICD-10-CM | POA: Insufficient documentation

## 2021-07-08 DIAGNOSIS — K760 Fatty (change of) liver, not elsewhere classified: Secondary | ICD-10-CM | POA: Diagnosis not present

## 2021-07-08 DIAGNOSIS — I83893 Varicose veins of bilateral lower extremities with other complications: Secondary | ICD-10-CM | POA: Diagnosis not present

## 2021-07-08 NOTE — Progress Notes (Signed)
    Procedures performed today:    None.  Independent interpretation of notes and tests performed by another provider:   None.  Brief History, Exam, Impression, and Recommendations:    BP 114/68   Pulse 94   Ht $R'5\' 11"'Vn$  (1.803 m)   Wt 159 lb 11.2 oz (72.4 kg)   SpO2 93%   BMI 22.27 kg/m   Varicose veins of leg with swelling, bilateral Has met with vascular surgery, initial recommendations were for thigh-high compression stockings with increased compression as well as elevation of lower extremities Patient is scheduled to follow-up with vascular surgeon in a few months to discuss possibility of ablation He has been using compression stockings and finds that these have been helpful in controlling swelling and reducing symptoms  Weakness of left lower extremity Patient with ongoing numbness, weakness, antalgic gait in left lower extremity primarily Has seen neurology, MRI of the lumbar spine was ordered, however patient found out today that this imaging study has been denied by insurance He will be discussing further with neurology regarding this and working to appeal the decision and get approval for MRI  Hepatic steatosis Patient was found to have slightly elevated liver enzymes on prior labs.  Follow-up labs and imaging were normal in regards to hepatitis panel, iron studies.  Ultrasound did show increased echogenicity and findings suggestive of hepatic steatosis Discussed these findings with patient and recommendation for lifestyle modifications.  Specifically, discussed appropriate dietary choices including incorporation of fresh fruits and vegetables, lean protein, avoidance of processed foods, saturated fats, red meats BMI is within normal range, still generally recommend increasing weekly physical activity.  Somewhat limited currently due to lower extremity swelling, weakness.  Plan for follow-up in about 3 to 4 months or sooner as  needed   ___________________________________________ Tiphany Fayson [place], MD, ABFM, CAQSM Primary Care and [place]

## 2021-07-08 NOTE — Assessment & Plan Note (Signed)
Has met with vascular surgery, initial recommendations were for thigh-high compression stockings with increased compression as well as elevation of lower extremities Patient is scheduled to follow-up with vascular surgeon in a few months to discuss possibility of ablation He has been using compression stockings and finds that these have been helpful in controlling swelling and reducing symptoms

## 2021-07-08 NOTE — Patient Instructions (Signed)
  Medication Instructions:  Your physician recommends that you continue on your current medications as directed. Please refer to the Current Medication list given to you today. --If you need a refill on any your medications before your next appointment, please call your pharmacy first. If no refills are authorized on file call the office.--  Follow-Up: Your next appointment:   Your physician recommends that you schedule a follow-up appointment in: 3-4 MONTHS with Dr. de [place]  Thanks for letting us be apart of your health journey!!  Primary Care and Sports Medicine   Dr. Arlina Robes [place]   We encourage you to activate your patient portal called "MyChart".  Sign up information is provided on this After Visit Summary.  MyChart is used to connect with patients for Virtual Visits (Telemedicine).  Patients are able to view lab/test results, encounter notes, upcoming appointments, etc.  Non-urgent messages can be sent to your provider as well. To learn more about what you can do with MyChart, please visit --  [email or web address].

## 2021-07-08 NOTE — Assessment & Plan Note (Signed)
Patient with ongoing numbness, weakness, antalgic gait in left lower extremity primarily Has seen neurology, MRI of the lumbar spine was ordered, however patient found out today that this imaging study has been denied by insurance He will be discussing further with neurology regarding this and working to appeal the decision and get approval for MRI

## 2021-07-08 NOTE — Assessment & Plan Note (Signed)
Patient was found to have slightly elevated liver enzymes on prior labs.  Follow-up labs and imaging were normal in regards to hepatitis panel, iron studies.  Ultrasound did show increased echogenicity and findings suggestive of hepatic steatosis Discussed these findings with patient and recommendation for lifestyle modifications.  Specifically, discussed appropriate dietary choices including incorporation of fresh fruits and vegetables, lean protein, avoidance of processed foods, saturated fats, red meats BMI is within normal range, still generally recommend increasing weekly physical activity.  Somewhat limited currently due to lower extremity swelling, weakness.

## 2021-07-09 ENCOUNTER — Telehealth: Payer: Self-pay | Admitting: Neurology

## 2021-07-09 ENCOUNTER — Other Ambulatory Visit: Payer: Managed Care, Other (non HMO)

## 2021-07-09 DIAGNOSIS — R29898 Other symptoms and signs involving the musculoskeletal system: Secondary | ICD-10-CM

## 2021-07-09 DIAGNOSIS — R292 Abnormal reflex: Secondary | ICD-10-CM

## 2021-07-09 NOTE — Telephone Encounter (Signed)
Patient called and said his MRI was cancelled due to no authorization for the test.  He did a lot of follow up work today to get an answer as to why. At the end of the day, patient called and said [facility] told him [employer] is requesting a peer to peer or he will need an xray.  Patient stated he is continuing to have worsening symptoms and he'd like the doctor's recommendations on how best to proceed.

## 2021-07-12 NOTE — Telephone Encounter (Signed)
I have called his insurance company for a peer-to-peer which was denied because patient has not completed 6 weeks of treatment and does not have x-ray of the lumbar spine.   Please inform pt that we will refer him to start PT for left leg strengthening and order x-ray lumbar spine.  Patient will need to follow-up in 2-3 months.  Once completed, we can request MRI lumbar spine and resubmitted for [employer], Case [id number] with the above listed information and faxed to [phone].

## 2021-07-13 NOTE — Telephone Encounter (Signed)
Pt came by the office and was informed that Dr Posey Pronto has called his insurance company for a peer-to-peer which was denied because patient has not completed 6 weeks of treatment and does not have x-ray of the lumbar spine.    PT inform pt that we will refer him to start I have called his insurance company for a peer-to-peer which was denied because patient has not completed 6 weeks of treatment and does not have x-ray of the lumbar spine.    Please inform pt that we will refer him to start PT for left leg strengthening and order x-ray lumbar spine.  Patient will need to follow-up in 2-3 months and order x-ray lumbar spine.  Patient will need to follow-up in 2-3 months  Order placed in Epic for him will call him to let him know order sent to [facility]

## 2021-07-13 NOTE — Telephone Encounter (Signed)
Patient stopped by the office for an update. Jacob Davila came up and spoke with him.  Please call patient back with a contact number for PT so he can schedule that as soon as possible.

## 2021-07-28 ENCOUNTER — Telehealth: Payer: Self-pay

## 2021-07-28 NOTE — Telephone Encounter (Signed)
New message    Office notes and referral sent to [facility]   Fax# [phone]

## 2021-08-10 ENCOUNTER — Telehealth: Payer: Self-pay | Admitting: Cardiology

## 2021-08-10 NOTE — Telephone Encounter (Signed)
Spoke with patient regarding message below. Unable to reach [facility]  He has episodes where he has a fast HR, feels cold, dizzy. This can occur at random times, not particularly associated with activity. He states this has happened while sitting. He reports his face goes white. He feels like the blood will rush out of his head when this happens. He does not have chest pain/shortness of breath during episode.   He is wearing compression stockings. He stays well hydrated. He has 6oz pepsi or equivalent in the morning on workdays. He tries to eat healthy.   He has had a relatively normal monitor/echo  Advised I will send a message to Dr. Ellyn Hack to review/advise

## 2021-08-10 NOTE — Telephone Encounter (Signed)
  STAT if HR is under 50 or over 120 (normal HR is 60-100 beats per minute)  What is your heart rate? Over 120  Do you have a log of your heart rate readings (document readings)? no  Do you have any other symptoms? Raquel Sarna from [facility] called to say that when patient were in there office he had a spell. Nurse stated that he had HR over 120. Patient states that he was cold and dizzy.when he tried to stand up he stumble and had to lay down for a least 30 mins. She stated that she could call someone for him but he didn't want that. Just wanted to inform our office of whats going on.Please advise

## 2021-08-11 NOTE — Telephone Encounter (Signed)
Spoke with patient about recommendations from MD. He would like to research the med. Sent him a MyChart message about recommendations (copied from MD note below)

## 2021-08-11 NOTE — Telephone Encounter (Signed)
Lets try low dose Diltiazem XT 120 mg PO daily, #90 tabs, 3 refills.    Hopefully we can reduce his tachycardia.      Recommend that he use a home monitoring device - I.e. Apple Watch, Kardia etc to capture an episode.  Event monitor did not capture any concerning arrhythmias.     [place]

## 2021-08-19 DIAGNOSIS — M7989 Other specified soft tissue disorders: Secondary | ICD-10-CM

## 2021-08-23 NOTE — Telephone Encounter (Signed)
Jodelle Red mobile should only be somewhere between $50-$80 for the monitor strip, the app is free.  This would be less than it would be to pay for short-term monitor at home.  Unfortunately we did not see anything on that monitor.  Diltiazem does not cause symptoms of jittering or palpitations.  Glenetta Hew, MD

## 2021-08-24 ENCOUNTER — Telehealth: Payer: Self-pay | Admitting: Neurology

## 2021-08-24 ENCOUNTER — Telehealth: Payer: Self-pay

## 2021-08-24 NOTE — Telephone Encounter (Signed)
Physical therapy notified, thanked me for calling to hold Physical therapy follow up with Cardiology

## 2021-08-24 NOTE — Telephone Encounter (Signed)
OK to hold PT for now.  Please advise pt to follow-up with cardiology for sinus tachycardia. Thanks.

## 2021-08-24 NOTE — Telephone Encounter (Signed)
Patient request appointment per front desk scheduler . Offered  Oct 24  with Dr Ellyn Hack or Oct 11 with [facility]  Patient decide on Aug 31, 2021.

## 2021-08-24 NOTE — Telephone Encounter (Signed)
Patient has had two episodes with dizziness, starting heart rate was 96 earlier, then laying down 120. please advise, [facility] did notify cardiology and Dr. Posey Pronto.Please advise, on recommendations, will contact [facility] [phone]. Attention Atha Starks for further directions.

## 2021-08-24 NOTE — Telephone Encounter (Signed)
Message sent to patient with MD notes

## 2021-08-24 NOTE — Telephone Encounter (Signed)
Let message for them to call back if still needed anything or to fax report.

## 2021-08-31 ENCOUNTER — Encounter: Payer: Self-pay | Admitting: Medical

## 2021-08-31 ENCOUNTER — Ambulatory Visit (INDEPENDENT_AMBULATORY_CARE_PROVIDER_SITE_OTHER): Payer: Managed Care, Other (non HMO) | Admitting: Medical

## 2021-08-31 ENCOUNTER — Other Ambulatory Visit: Payer: Self-pay

## 2021-08-31 VITALS — BP 116/74 | HR 96 | Ht 71.0 in | Wt 158.6 lb

## 2021-08-31 DIAGNOSIS — I479 Paroxysmal tachycardia, unspecified: Secondary | ICD-10-CM

## 2021-08-31 DIAGNOSIS — I83893 Varicose veins of bilateral lower extremities with other complications: Secondary | ICD-10-CM

## 2021-08-31 MED ORDER — DILTIAZEM HCL ER COATED BEADS 120 MG PO CP24: 120.0000 mg | ORAL_CAPSULE | Freq: Every day | ORAL | 3 refills | Status: DC

## 2021-08-31 NOTE — Patient Instructions (Signed)
Medication Instructions:  START DILTIAZEM 120MG  DAILY  *If you need a refill on your cardiac medications before your next appointment, please call your pharmacy*  Lab Work:   Testing/Procedures:  NONE    NONE  Special Instructions SEND KARTIA MOBILE TO [place] VIA MYCHART WHEN HAVING SYMPTOMS  Follow-Up: Your next appointment:  3 month(s) In Person with You may see Glenetta Hew, MD or one of the following Advanced Practice Providers on your designated Care Team:  Rosaria Ferries, PA-C  Caron Presume, PA-C OR Jory Sims, DNP, ANP   At [facility], you and your health needs are our priority.  As part of our continuing mission to provide you with exceptional heart care, we have created designated Provider Care Teams.  These Care Teams include your primary Cardiologist (physician) and Advanced Practice Providers (APPs -  Physician Assistants and Nurse Practitioners) who all work together to provide you with the care you need, when you need it.

## 2021-08-31 NOTE — Progress Notes (Signed)
Cardiology Office Note   Date:  08/31/2021   ID:  Jacob, Davila 1976-07-11, MRN [id number]  PCP:  de Davila, Jacob J, MD  Cardiologist:  Glenetta Hew, MD EP: None  Chief Complaint  Patient presents with   Palpitations           History of Present Illness: Jacob Davila is a 45 y.o. male with a PMH of palpitations, dizziness, and varicose veins, who presents for evaluation of recent tachypalpitations.  More recently he has been having trouble with intermittent tachycardia to the 120s associated with sensation of dizziness, pallor, and feeling cold, though no CP or SOB. This was observed when working with physical therapy a few weeks ago prompting recommendation to start diltiazem 120mg  daily and scheduling of this appointment.   Prior tot his he was evaluated by cardiology at an outpatient visit with Dr. Ellyn Hack 07/01/21 for follow-up of his tachypalpitations. He was doing better at that time with adequate control of leg pain with compression stockings and in turn, had improvement in palpitations which were largely attributed to anxiety in the setting of leg pain. He previously wore a cardiac monitor 06/2021 which revealed no significant arrhythmias, though patient reported not experiencing palpitations during the monitoring period. He has been recommended to obtain the Kardia mobile device for home monitoring but has not purchased yet. His last echocardiogram 05/2021 showed EF 60-65%, normal LV diastolic function, no RWMA, and no significant valvular abnormalities.   He presents today for evaluation of palpitations. He has continued to have episodes of tachypalpitations associated with dizziness and pallor. He reports these episodes can happen while sitting in his recliner or when up on his feet working. Thankfully no episodes of syncope. He will lay down and symptoms resolve within 10-15 minutes, rarely 20-25 minutes. He denies associated CP, SOB, DOE, orthopnea, PND. He has chronic  LE edema due to varicose veins and is anticipating vein stripping/ablation after a 90 day wait period. He notes vague complaints of poor appetite, early satiety, weight loss, insomnia, fatigue, and polydipsia for which he was encouraged to see his PCP about. TSH was wnl 03/2021.     Past Medical History:  Diagnosis Date   Scoliosis    - s/p correctvie Sgx   Venous insufficiency of both lower extremities 05/2021   Lower extremity venous Doppler 05/29/2021: No evidence of DVT.  Venous reflux noted in the right GSV in the calf.  Venous reflux noted in the left common femoral vein and left greater saphenous vein in the thigh.  Left GSV in the calf as well as a left perforator vein. => Forwarded to vascular surgery:    Past Surgical History:  Procedure Laterality Date   BACK SURGERY  03/1990   Scoliosis   TRANSTHORACIC ECHOCARDIOGRAM  06/08/2019   EF 60-65%.  Normal wall motion.  Normal relaxation.  Mild aortic valve thickening but no sclerosis/stenosis.  Normal echo     Current Outpatient Medications  Medication Sig Dispense Refill   diltiazem (CARDIZEM CD) 120 MG 24 hr capsule Take 1 capsule (120 mg total) by mouth daily. 30 capsule 3   diphenhydrAMINE (BENADRYL) 25 mg capsule Take 25 mg by mouth daily as needed for allergies.     ibuprofen (ADVIL) 200 MG tablet Take 400 mg by mouth every 6 (six) hours as needed for mild pain.     No current facility-administered medications for this visit.    Allergies:   Darvon [propoxyphene], Tessalon [benzonatate], Latex, and  Morphine and related    Social History:  The patient  reports that he has been smoking cigars. He has never used smokeless tobacco. He reports that he does not currently use alcohol. He reports that he does not use drugs.   Family History:  The patient's family history includes Atrial fibrillation in his mother; CAD in his paternal grandfather; Heart failure in his mother; Hyperlipidemia in his father; Hypertension in his  father.    ROS:  Please see the history of present illness.   Otherwise, review of systems are positive for none.   All other systems are reviewed and negative.    PHYSICAL EXAM: VS:  BP 116/74 (BP Location: Left Arm, Patient Position: Sitting, Cuff Size: Normal)   Pulse 96   Ht 5\' 11"  (1.803 m)   Wt 158 lb 9.6 oz (71.9 kg)   SpO2 98%   BMI 22.12 kg/m  , BMI Body mass index is 22.12 kg/m. GEN: Well nourished, well developed, in no acute distress HEENT: sclera anicteric Neck: no JVD, carotid bruits, or masses Cardiac: RRR; no murmurs, rubs, or gallops,no edema  Respiratory:  clear to auscultation bilaterally, normal work of breathing GI: soft, nontender, nondistended, + BS MS: no deformity or atrophy Skin: warm and dry, no rash Neuro:  Strength and sensation are intact Psych: euthymic mood, full affect   EKG:  EKG is ordered today. The ekg ordered today demonstrates sinus rhythm, rate 96 bpm, no STE/D, no TWI   Recent Labs: 04/19/2021: ALT 94; B Natriuretic Peptide 26.7; BUN 11; Creatinine, Ser 1.23; Hemoglobin 15.0; Magnesium 1.6; Platelets 183; Potassium 3.2; Sodium 138; TSH 1.632    Lipid Panel No results found for: CHOL, TRIG, HDL, CHOLHDL, VLDL, LDLCALC, LDLDIRECT    Wt Readings from Last 3 Encounters:  08/31/21 158 lb 9.6 oz (71.9 kg)  07/08/21 159 lb 11.2 oz (72.4 kg)  07/01/21 164 lb 3.2 oz (74.5 kg)      Other studies Reviewed: Additional studies/ records that were reviewed today include:   Echocardiogram 05/2021: 1. Left ventricular ejection fraction, by estimation, is 60 to 65%. Left  ventricular ejection fraction by 3D volume is 56 %. The left ventricle has  normal function. The left ventricle has no regional wall motion  abnormalities. Left ventricular diastolic   parameters were normal. The average left ventricular global longitudinal  strain is -25.3 %. The global longitudinal strain is normal.   2. Right ventricular systolic function is normal. The  right ventricular  size is normal.   3. The mitral valve is normal in structure. No evidence of mitral valve  regurgitation. No evidence of mitral stenosis.   4. The aortic valve is tricuspid. There is mild thickening of the aortic  valve. Aortic valve regurgitation is not visualized. No aortic stenosis is  present.   5. The inferior vena cava is normal in size with greater than 50%  respiratory variability, suggesting right atrial pressure of 3 mmHg.   Comparison(s): No prior Echocardiogram.   Cardiac monitor 06/2021: Monitor worn from 7/8-8/6 2022 Baseline rhythm was sinus rhythm: Minimum heart rate 63 bpm, max heart rate 158 bpm, average 94 bpm. Rare(<1%) PACs and PVCs noted. Sinus tachycardia noted but no arrhythmias noted. No tachyarrhythmias or bradycardia arrhythmias noted. (No atrial fibrillation, atrial flutter, supraventricular tachycardia, paroxysmal atrial tachycardia or ventricular tachycardia). No bradycardia noted. No patient triggered events.   Pretty normal study.  Shows normal sinus rhythm that can go fast.  No slow rhythms.  No abnormal  rhythms.     ASSESSMENT AND PLAN:  1. Palpitations: recent benign cardiac monitor 06/2021. Recommended to obtain Kardia mobile device for home monitoring - he has ordered and is awaiting delivery. Also recommended to start diltiazem 120mg  daily, though patient has not started as he was concerned about side effect profile. He has continued to have intermittent palpitation episodes associated with pallor and dizziness; now agreeable to medications - Will start diltiazem 120mg  daily - Instructed patient to send kardia mobile strips captured during symptomatic episodes  2. Varicose veins: venous reflux noted on venous dopplers 05/2021. Symptoms improve with regular compression stocking use. He has vascular surgery evaluation and is planning for possible vein stripping/ablation  - Continue compression stockings.   Encouraged PCP follow-up  to evaluate other non-cardiac symptoms including poor appetite, early satiety, weight loss, and insomnia.  Current medicines are reviewed at length with the patient today.  The patient does not have concerns regarding medicines.  The following changes have been made:  As above  Labs/ tests ordered today include:   Orders Placed This Encounter  Procedures   EKG 12-Lead      Disposition:   FU with Dr. Ellyn Hack in 3-4 months  Signed, Abigail Butts, PA-C  08/31/2021 9:49 AM

## 2021-09-01 ENCOUNTER — Encounter (HOSPITAL_BASED_OUTPATIENT_CLINIC_OR_DEPARTMENT_OTHER): Payer: Self-pay | Admitting: Family Medicine

## 2021-09-15 ENCOUNTER — Other Ambulatory Visit: Payer: Self-pay

## 2021-09-15 ENCOUNTER — Ambulatory Visit (INDEPENDENT_AMBULATORY_CARE_PROVIDER_SITE_OTHER): Payer: Managed Care, Other (non HMO) | Admitting: Family Medicine

## 2021-09-15 ENCOUNTER — Encounter (HOSPITAL_BASED_OUTPATIENT_CLINIC_OR_DEPARTMENT_OTHER): Payer: Self-pay | Admitting: Family Medicine

## 2021-09-15 VITALS — BP 120/60 | HR 90 | Ht 71.0 in | Wt 157.6 lb

## 2021-09-15 DIAGNOSIS — R1310 Dysphagia, unspecified: Secondary | ICD-10-CM | POA: Insufficient documentation

## 2021-09-15 DIAGNOSIS — I479 Paroxysmal tachycardia, unspecified: Secondary | ICD-10-CM | POA: Diagnosis not present

## 2021-09-15 DIAGNOSIS — R7401 Elevation of levels of liver transaminase levels: Secondary | ICD-10-CM | POA: Diagnosis not present

## 2021-09-15 NOTE — Patient Instructions (Signed)
  Medication Instructions:  Your physician recommends that you continue on your current medications as directed. Please refer to the Current Medication list given to you today. --If you need a refill on any your medications before your next appointment, please call your pharmacy first. If no refills are authorized on file call the office.-- Lab Work: Your physician has recommended that you have lab work today: CBC and CMP If you have labs (blood work) drawn today and your tests are completely normal, you will receive your results via [facility] a phone call from our staff.  Please ensure you check your voicemail in the event that you authorized detailed messages to be left on a delegated number. If you have any lab test that is abnormal or we need to change your treatment, we will call you to review the results.  Referrals/Procedures/Imaging: A referral has been placed for you to [facility] evaluation and treatment. Someone from the scheduling department will be in contact with you in regards to coordinating your procedure. If you do not hear from any of the schedulers within 7-10 business days please give their office a call at [phone]. Their endoscopy office hours are Monday - Friday 7:30 am - 6:00 pm  [facility] [address] [place], [place] [address] Main Line: [phone]  Follow-Up: Your next appointment:   Your physician recommends that you schedule a follow-up appointment in: 21 WEEKS with Dr. de [place]  You will receive a text message or e-mail with a link to a survey about your care and experience with [place] today! We would greatly appreciate your feedback!   Thanks for letting us be apart of your health journey!!  Primary Care and Sports Medicine   Dr. Arlina Robes [place]   We encourage you to activate your patient portal called "MyChart".  Sign up information is provided on this After Visit Summary.  MyChart is used to connect  with patients for Virtual Visits (Telemedicine).  Patients are able to view lab/test results, encounter notes, upcoming appointments, etc.  Non-urgent messages can be sent to your provider as well. To learn more about what you can do with MyChart, please visit --  [email or web address].

## 2021-09-15 NOTE — Assessment & Plan Note (Signed)
Noted previously, will check labs today

## 2021-09-15 NOTE — Assessment & Plan Note (Addendum)
Reports continued episodes of dizziness, feeling as though he is going to blackout.  Some recent episodes which have occurred with physical therapy.  At last office visit with cardiology he was started on diltiazem.  Has not noted significant change in symptoms since starting medication Reports that he has obtained a Kardia machine to record episodes.  Has not followed up with cardiology since obtaining this machine Recommend that given continued symptoms, recorded episodes on machine, he should contact cardiology to schedule sooner follow-up.  Current follow-up is not scheduled until February Will also check labs today

## 2021-09-15 NOTE — Assessment & Plan Note (Signed)
Reports that for a few weeks now he has had decreased appetite, trouble swallowing, early satiety, globus sensation at times.  Denies any prior evaluation with GI.  Feels that he has had some associated weight loss as well.  Feels that at times he has to force himself to eat Given current symptoms, associated weight loss, will refer to GI for further evaluation, specifically has some concern about possible structural etiology contributing to his current symptoms, may benefit from endoscopy for further evaluation

## 2021-09-15 NOTE — Progress Notes (Signed)
    Procedures performed today:    None.  Independent interpretation of notes and tests performed by another provider:   None.  Brief History, Exam, Impression, and Recommendations:    BP 120/60   Pulse 90   Ht 5\' 11"  (1.803 m)   Wt 157 lb 9.6 oz (71.5 kg)   SpO2 99%   BMI 21.98 kg/m   Paroxysmal tachycardia (HCC) Reports continued episodes of dizziness, feeling as though he is going to blackout.  Some recent episodes which have occurred with physical therapy.  At last office visit with cardiology he was started on diltiazem.  Has not noted significant change in symptoms since starting medication Reports that he has obtained a Kardia machine to record episodes.  Has not followed up with cardiology since obtaining this machine Recommend that given continued symptoms, recorded episodes on machine, he should contact cardiology to schedule sooner follow-up.  Current follow-up is not scheduled until February Will also check labs today  Dysphagia Reports that for a few weeks now he has had decreased appetite, trouble swallowing, early satiety, globus sensation at times.  Denies any prior evaluation with GI.  Feels that he has had some associated weight loss as well.  Feels that at times he has to force himself to eat Given current symptoms, associated weight loss, will refer to GI for further evaluation, specifically has some concern about possible structural etiology contributing to his current symptoms, may benefit from endoscopy for further evaluation  Transaminitis Noted previously, will check labs today  Spent 30 minutes on this patient encounter, including preparation, chart review, face-to-face counseling with patient and coordination of care, and documentation of encounter  Plan for follow-up in about 4 to 6 weeks or sooner as needed   ___________________________________________ Kawehi Hostetter de [place], MD, ABFM, CAQSM Primary Care and [place]

## 2021-09-16 ENCOUNTER — Ambulatory Visit
Admission: RE | Admit: 2021-09-16 | Discharge: 2021-09-16 | Disposition: A | Discharge status: Home / Self Care | Payer: Managed Care, Other (non HMO) | Source: Ambulatory Visit | Attending: Neurology | Admitting: Neurology

## 2021-09-16 DIAGNOSIS — R29898 Other symptoms and signs involving the musculoskeletal system: Secondary | ICD-10-CM

## 2021-09-16 DIAGNOSIS — R292 Abnormal reflex: Secondary | ICD-10-CM

## 2021-09-16 LAB — CBC WITH DIFFERENTIAL/PLATELET
Basophils Absolute: 0.1 10*3/uL (ref 0.0–0.2)
Basos: 2 %
EOS (ABSOLUTE): 0.1 10*3/uL (ref 0.0–0.4)
Eos: 2 %
Hematocrit: 35.1 % — ABNORMAL LOW (ref 37.5–51.0)
Hemoglobin: 11.9 g/dL — ABNORMAL LOW (ref 13.0–17.7)
Immature Grans (Abs): 0 10*3/uL (ref 0.0–0.1)
Immature Granulocytes: 0 %
Lymphocytes Absolute: 1.4 10*3/uL (ref 0.7–3.1)
Lymphs: 33 %
MCH: 35.2 pg — ABNORMAL HIGH (ref 26.6–33.0)
MCHC: 33.9 g/dL (ref 31.5–35.7)
MCV: 104 fL — ABNORMAL HIGH (ref 79–97)
Monocytes Absolute: 0.5 10*3/uL (ref 0.1–0.9)
Monocytes: 12 %
Neutrophils Absolute: 2.1 10*3/uL (ref 1.4–7.0)
Neutrophils: 51 %
Platelets: 212 10*3/uL (ref 150–450)
RBC: 3.38 x10E6/uL — ABNORMAL LOW (ref 4.14–5.80)
RDW: 11.7 % (ref 11.6–15.4)
WBC: 4.2 10*3/uL (ref 3.4–10.8)

## 2021-09-16 LAB — COMPREHENSIVE METABOLIC PANEL
ALT: 105 IU/L — ABNORMAL HIGH (ref 0–44)
AST: 150 IU/L — ABNORMAL HIGH (ref 0–40)
Albumin/Globulin Ratio: 1.7 (ref 1.2–2.2)
Albumin: 3.8 g/dL — ABNORMAL LOW (ref 4.0–5.0)
Alkaline Phosphatase: 137 IU/L — ABNORMAL HIGH (ref 44–121)
BUN/Creatinine Ratio: 10 (ref 9–20)
BUN: 12 mg/dL (ref 6–24)
Bilirubin Total: 0.4 mg/dL (ref 0.0–1.2)
CO2: 22 mmol/L (ref 20–29)
Calcium: 8.7 mg/dL (ref 8.7–10.2)
Chloride: 102 mmol/L (ref 96–106)
Creatinine, Ser: 1.19 mg/dL (ref 0.76–1.27)
Globulin, Total: 2.3 g/dL (ref 1.5–4.5)
Glucose: 101 mg/dL — ABNORMAL HIGH (ref 70–99)
Potassium: 4.6 mmol/L (ref 3.5–5.2)
Sodium: 139 mmol/L (ref 134–144)
Total Protein: 6.1 g/dL (ref 6.0–8.5)
eGFR: 76 mL/min/{1.73_m2} (ref >59)

## 2021-09-20 ENCOUNTER — Other Ambulatory Visit: Payer: Self-pay

## 2021-09-20 DIAGNOSIS — R29898 Other symptoms and signs involving the musculoskeletal system: Secondary | ICD-10-CM

## 2021-09-20 DIAGNOSIS — R292 Abnormal reflex: Secondary | ICD-10-CM

## 2021-09-29 ENCOUNTER — Other Ambulatory Visit: Payer: Self-pay

## 2021-09-29 ENCOUNTER — Ambulatory Visit (INDEPENDENT_AMBULATORY_CARE_PROVIDER_SITE_OTHER): Payer: Managed Care, Other (non HMO) | Admitting: Vascular Surgery

## 2021-09-29 ENCOUNTER — Encounter: Payer: Self-pay | Admitting: Vascular Surgery

## 2021-09-29 VITALS — BP 117/76 | HR 106 | Temp 98.4°F | Resp 16 | Ht 71.0 in | Wt 154.0 lb

## 2021-09-29 DIAGNOSIS — I83893 Varicose veins of bilateral lower extremities with other complications: Secondary | ICD-10-CM

## 2021-09-29 DIAGNOSIS — I872 Venous insufficiency (chronic) (peripheral): Secondary | ICD-10-CM | POA: Diagnosis not present

## 2021-09-29 NOTE — Progress Notes (Signed)
REASON FOR VISIT:   Follow-up of chronic venous insufficiency  MEDICAL ISSUES:   CHRONIC VENOUS INSUFFICIENCY: This patient has significant symptoms in the left leg and also has had swelling in both legs but more significantly on the left side.  He has both deep and superficial venous reflux on duplex.  We have discussed importance of intermittent leg elevation and the proper positioning for this.  I have encouraged him to continue to wear his compression stockings especially during the day if he is on his feet for long time.  I have explained that a knee-high stocking may be more practical than the thigh-high stockings that he is currently wearing.  I have encouraged him to avoid prolonged sitting and standing.  We discussed importance of exercise specifically walking and water aerobics.  Fortunately he has a healthy weight which certainly helps.  He has no significant reflux at the saphenofemoral junction in the proximal thigh and for this reason currently is not an ideal candidate for laser ablation of the left great saphenous vein.  The vein does look dilated throughout with reflux from the mid thigh to the mid calf however I did not demonstrate reflux in the proximal vein either.  Given that he is having significant symptoms I think it would be reasonable to bring him back next year and repeat his study to see if things have changed.  Of note he does have problems with back pain and hip pain that may also be contributing to his leg symptoms.  He will call sooner if things change.   HPI:   Jacob Davila is a pleasant 45 y.o. male who was seen by Dr. Carlis Abbott on 06/22/2021 with bilateral lower extremity swelling.  His symptoms were more significant on the left side.  He had CEAP C3 venous disease.  He had deep venous reflux and superficial venous reflux on duplex.  He was fitted for thigh-high compression stockings with a gradient of 20 to 30 mmHg, encouraged to elevate his legs, and encouraged to  exercise, and comes back for a 6-month follow-up visit.  On my history the patient, has had significant problems with back pain and hip pain.  He had surgery for scoliosis at age 45.  He has some numbness in the leg related to this.  From our standpoint his main complaint has been swelling in both legs but more significant on the left side.  He also describes some aching pain and heaviness which is worse at the end of day.  The compression stockings do help and leg elevation helps.  He has no previous history of DVT and no previous history of venous procedures.  Past Medical History:  Diagnosis Date   Scoliosis    - s/p correctvie Sgx   Venous insufficiency of both lower extremities 05/2021   Lower extremity venous Doppler 05/29/2021: No evidence of DVT.  Venous reflux noted in the right GSV in the calf.  Venous reflux noted in the left common femoral vein and left greater saphenous vein in the thigh.  Left GSV in the calf as well as a left perforator vein. => Forwarded to vascular surgery:    Family History  Problem Relation Age of Onset   Atrial fibrillation Mother    Heart failure Mother    Hypertension Father    Hyperlipidemia Father    CAD Paternal Grandfather        noted to have Angina (not aware of details)    SOCIAL HISTORY: Social  History   Tobacco Use   Smoking status: Some Days    Types: Cigars   Smokeless tobacco: Never  Substance Use Topics   Alcohol use: Not Currently    Allergies  Allergen Reactions   Darvon [Propoxyphene]    Tessalon [Benzonatate]    Latex Rash   Morphine And Related Rash    Current Outpatient Medications  Medication Sig Dispense Refill   diltiazem (CARDIZEM CD) 120 MG 24 hr capsule Take 1 capsule (120 mg total) by mouth daily. 30 capsule 3   diphenhydrAMINE (BENADRYL) 25 mg capsule Take 25 mg by mouth daily as needed for allergies.     ibuprofen (ADVIL) 200 MG tablet Take 400 mg by mouth every 6 (six) hours as needed for mild pain.      No current facility-administered medications for this visit.    REVIEW OF SYSTEMS:  [X]  denotes positive finding, [ ]  denotes negative finding Cardiac  Comments:  Chest pain or chest pressure:    Shortness of breath upon exertion:    Short of breath when lying flat:    Irregular heart rhythm:        Vascular    Pain in calf, thigh, or hip brought on by ambulation:    Pain in feet at night that wakes you up from your sleep:     Blood clot in your veins:    Leg swelling:  x       Pulmonary    Oxygen at home:    Productive cough:     Wheezing:         Neurologic    Sudden weakness in arms or legs:     Sudden numbness in arms or legs:     Sudden onset of difficulty speaking or slurred speech:    Temporary loss of vision in one eye:     Problems with dizziness:         Gastrointestinal    Blood in stool:     Vomited blood:         Genitourinary    Burning when urinating:     Blood in urine:        Psychiatric    Major depression:         Hematologic    Bleeding problems:    Problems with blood clotting too easily:        Skin    Rashes or ulcers:        Constitutional    Fever or chills:     PHYSICAL EXAM:   Vitals:   09/29/21 1306  BP: 117/76  Pulse: (!) 106  Resp: 16  Temp: 98.4 F (36.9 C)  TempSrc: Temporal  SpO2: 95%  Weight: 154 lb (69.9 kg)  Height: 5\' 11"  (1.803 m)    GENERAL: The patient is a well-nourished male, in no acute distress. The vital signs are documented above. CARDIAC: There is a regular rate and rhythm.  VASCULAR: I do not detect carotid bruits. He has palpable dorsalis pedis pulses. He has some dilated varicose veins in the left calf which are under elevated pressure. I did look at the left great saphenous vein myself with the SonoSite which is significantly dilated from the mid calf up to the saphenofemoral junction.  However I could not demonstrate reflux in the proximal vein and at the saphenofemoral junction. PULMONARY:  There is good air exchange bilaterally without wheezing or rales. ABDOMEN: Soft and non-tender with normal pitched bowel sounds.  MUSCULOSKELETAL: There  are no major deformities or cyanosis. NEUROLOGIC: No focal weakness or paresthesias are detected. SKIN: There are no ulcers or rashes noted. PSYCHIATRIC: The patient has a normal affect.  DATA:    VENOUS DUPLEX: I have reviewed the venous duplex scan that was done on 05/28/2021.  On the left side, which is the side with his symptoms, there is no evidence of DVT.  There is deep venous reflux in the common femoral vein.  There is superficial venous reflux in the left great saphenous vein from the mid thigh to the mid calf with diameters of about 5 mm throughout.    A total of 45 minutes was spent on this visit. 25 minutes was face to face time. More than 50% of the time was spent on counseling and coordinating with the patient.    Deitra Mayo [facility] [phone]

## 2021-09-30 ENCOUNTER — Other Ambulatory Visit: Payer: Self-pay

## 2021-09-30 DIAGNOSIS — I83893 Varicose veins of bilateral lower extremities with other complications: Secondary | ICD-10-CM

## 2021-10-08 ENCOUNTER — Ambulatory Visit (HOSPITAL_BASED_OUTPATIENT_CLINIC_OR_DEPARTMENT_OTHER): Payer: Managed Care, Other (non HMO) | Admitting: Family Medicine

## 2021-10-13 ENCOUNTER — Ambulatory Visit
Admission: RE | Admit: 2021-10-13 | Discharge: 2021-10-13 | Disposition: A | Discharge status: Home / Self Care | Payer: Managed Care, Other (non HMO) | Source: Ambulatory Visit | Attending: Neurology | Admitting: Neurology

## 2021-10-13 ENCOUNTER — Other Ambulatory Visit: Payer: Self-pay

## 2021-10-13 ENCOUNTER — Encounter (HOSPITAL_BASED_OUTPATIENT_CLINIC_OR_DEPARTMENT_OTHER): Payer: Self-pay

## 2021-10-13 DIAGNOSIS — R29898 Other symptoms and signs involving the musculoskeletal system: Secondary | ICD-10-CM

## 2021-10-13 DIAGNOSIS — R292 Abnormal reflex: Secondary | ICD-10-CM

## 2021-10-19 ENCOUNTER — Other Ambulatory Visit: Payer: Self-pay

## 2021-10-19 DIAGNOSIS — R29898 Other symptoms and signs involving the musculoskeletal system: Secondary | ICD-10-CM

## 2021-10-20 ENCOUNTER — Ambulatory Visit: Payer: Managed Care, Other (non HMO) | Admitting: Gastroenterology

## 2021-11-23 ENCOUNTER — Ambulatory Visit (INDEPENDENT_AMBULATORY_CARE_PROVIDER_SITE_OTHER): Payer: Managed Care, Other (non HMO) | Admitting: Neurology

## 2021-11-23 ENCOUNTER — Other Ambulatory Visit: Payer: Self-pay

## 2021-11-23 DIAGNOSIS — R29898 Other symptoms and signs involving the musculoskeletal system: Secondary | ICD-10-CM

## 2021-11-23 NOTE — Procedures (Signed)
[facility]  [address]  [place], [place] [address] Tel: [phone] Fax:  [phone] Test Date:  11/23/2021  Patient: Jacob Davila DOB: October 27, 1976 Physician: Narda Amber, DO  Sex: Male Height: 5\' 11"  Ref Phys: Narda Amber, DO  ID#: [id number]   Technician:    Patient Complaints: This is a 46 year old man referred for evaluation of left leg weakness.  NCV & EMG Findings: Extensive electrodiagnostic testing of the left lower extremity shows:  Left sural and superficial peroneal sensory responses are within normal limits. Left peroneal and tibial motor responses are within normal limits. Left tibial H reflex studies within normal limits. There is no evidence of active or chronic motor axonal loss changes affecting any of the tested muscles.  Motor unit configuration and recruitment pattern is within normal limits.  Impression: This is a normal study of the left lower extremity.  In particular, there is no evidence of a lumbosacral radiculopathy or sensorimotor polyneuropathy.   ___________________________ Narda Amber, DO    Nerve Conduction Studies Anti Sensory Summary Table   Stim Site NR Peak (ms) Norm Peak (ms) P-T Amp (V) Norm P-T Amp  Left Sup Peroneal Anti Sensory (Ant Lat Mall)  33C  12 cm    2.2 <4.5 10.0 >5  Left Sural Anti Sensory (Lat Mall)  33C  Calf    2.7 <4.5 11.2 >5   Motor Summary Table   Stim Site NR Onset (ms) Norm Onset (ms) O-P Amp (mV) Norm O-P Amp Site1 Site2 Delta-0 (ms) Dist (cm) Vel (m/s) Norm Vel (m/s)  Left Peroneal Motor (Ext Dig Brev)  33C  Ankle    3.6 <5.5 6.0 >3 B Fib Ankle 6.8 40.0 59 >40  B Fib    10.4  5.7  Poplt B Fib 1.6 10.0 63 >40  Poplt    12.0  5.7         Left Tibial Motor (Abd Hall Brev)  33C  Ankle    3.4 <6.0 22.8 >8 Knee Ankle 8.6 44.0 51 >40  Knee    12.0  15.8          H Reflex Studies   NR H-Lat (ms) Lat Norm (ms) L-R H-Lat (ms)  Left Tibial (Gastroc)  33C     31.97 <35     EMG   Side Muscle Ins Act Fibs Psw Fasc Number Recrt Dur Dur. Amp Amp. Poly Poly. Comment  Left AntTibialis Nml Nml Nml Nml Nml Nml Nml Nml Nml Nml Nml Nml N/A  Left Gastroc Nml Nml Nml Nml Nml Nml Nml Nml Nml Nml Nml Nml N/A  Left Flex Dig Long Nml Nml Nml Nml Nml Nml Nml Nml Nml Nml Nml Nml N/A  Left RectFemoris Nml Nml Nml Nml Nml Nml Nml Nml Nml Nml Nml Nml N/A  Left GluteusMed Nml Nml Nml Nml Nml Nml Nml Nml Nml Nml Nml Nml N/A  Left AdductorLong Nml Nml Nml Nml Nml Nml Nml Nml Nml Nml Nml Nml N/A      Waveforms:

## 2021-11-30 ENCOUNTER — Ambulatory Visit: Payer: Managed Care, Other (non HMO) | Admitting: Gastroenterology

## 2021-12-02 ENCOUNTER — Encounter: Payer: Self-pay | Admitting: Cardiology

## 2021-12-03 ENCOUNTER — Telehealth: Payer: Self-pay | Admitting: Cardiology

## 2021-12-03 NOTE — Telephone Encounter (Signed)
Follow Up:     Patient said he had talked to [facility] yesterday, said he needs to talk to somebody today please.

## 2021-12-03 NOTE — Telephone Encounter (Signed)
He was seen by Dr. Gae Gallop in November, and seem to be doing okay.  As such, they simply said they are followed up with the summertime.  He just needs to contact their office to see if he can come in sooner.  I can forward this note to Dr. Doren Custard  We can reviewed the monitor strips when they are uploaded.    Glenetta Hew, MD

## 2021-12-03 NOTE — Telephone Encounter (Signed)
Spoke with patient. He would like to talk to Dr. Ellyn Hack - said he has had a lot of tests done, all WNL. He states he needs surgery for an venous ablation and wants to talk with someone how to get this done, before July, which he states is when he was told to follow up with [facility].  He is needing to appeal with his insurance. Advised he contact [facility] for this. He asks if Dr. Ellyn Hack can write a letter stating he needs a venous ablation done, since he referred him to [facility]. Advised will send message to MD about this, but he should also contact [facility] since they are following him for this condition.   He also reports episodes of tachycardia (HR 132bpm) -- he would like to come by office for nurse to show him how to upload strips to [place]. Routed to [employer] for assistance. I tried to explain over the phone also.

## 2021-12-06 NOTE — Telephone Encounter (Signed)
Spoke with patient and relayed message from MD. Advised he needs to contact [facility] about possible ablation

## 2021-12-10 DIAGNOSIS — M7989 Other specified soft tissue disorders: Secondary | ICD-10-CM

## 2021-12-27 ENCOUNTER — Telehealth: Payer: Self-pay | Admitting: Gastroenterology

## 2021-12-27 ENCOUNTER — Ambulatory Visit: Payer: Managed Care, Other (non HMO) | Admitting: Gastroenterology

## 2021-12-27 NOTE — Telephone Encounter (Signed)
Hey Dr. Lyndel Safe,   Patient called in to cancel appt for today 2/6. This would make his 3rd overall late cancellation. Could he be rescheduled?  Thank you

## 2021-12-31 ENCOUNTER — Encounter: Payer: Self-pay | Admitting: Cardiology

## 2021-12-31 ENCOUNTER — Other Ambulatory Visit: Payer: Self-pay

## 2021-12-31 ENCOUNTER — Ambulatory Visit (INDEPENDENT_AMBULATORY_CARE_PROVIDER_SITE_OTHER): Payer: Managed Care, Other (non HMO) | Admitting: Cardiology

## 2021-12-31 VITALS — BP 124/68 | HR 85 | Ht 71.0 in | Wt 152.0 lb

## 2021-12-31 DIAGNOSIS — I479 Paroxysmal tachycardia, unspecified: Secondary | ICD-10-CM

## 2021-12-31 DIAGNOSIS — I83893 Varicose veins of bilateral lower extremities with other complications: Secondary | ICD-10-CM | POA: Diagnosis not present

## 2021-12-31 DIAGNOSIS — R000 Tachycardia, unspecified: Secondary | ICD-10-CM

## 2021-12-31 MED ORDER — METOPROLOL SUCCINATE ER 25 MG PO TB24: 25.0000 mg | ORAL_TABLET | Freq: Every day | ORAL | 3 refills | Status: DC

## 2021-12-31 MED ORDER — FUROSEMIDE 20 MG PO TABS: 20.0000 mg | ORAL_TABLET | Freq: Every day | ORAL | 12 refills | Status: DC

## 2021-12-31 NOTE — Progress Notes (Signed)
Primary Care Provider: [place], Blondell Reveal, MD Cardiologist: Glenetta Hew, MD Electrophysiologist: None Vascular surgery: Seen by Dr. Carlis Abbott, referred to Dr. 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  He is wearing compression stockings and edema seems to be well controlled.  He should continue to follow-up with Dr. Gae Gallop with reflux studies in a year or so to see if there is progression of disease which would allow them to consider left-sided greater saphenous vein ablation.  Since he still has some swelling, we will provide low-dose Lasix to help with volume removal.  This should not take away the fact that he needs to stay adequately hydrated.  Discussed importance of foot elevation, reiterated vascular surgical recommendations.      Relevant Medications   metoprolol succinate (TOPROL XL) 25 MG 24 hr tablet   furosemide (LASIX) 20 MG tablet   Paroxysmal tachycardia (HCC) - Primary (Chronic)    Is Home monitor suggests sinus tachycardia as the main etiology.  I did not see any significant arrhythmias or for that matter even ectopy.  I suspect that some of this is driven by pain, and potentially even orthostasis with his edema.  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  Unfortunately there are no true arrhythmias.  We talked about vagal maneuvers and adequate hydration.  I do think that the compression stockings should help as should foot elevation.  We are going to provide low-dose diuretic to help with some edema but he needs to drink more than he he is. Starting low-dose beta-blocker.      Relevant Orders   EKG 12-Lead   ===================================  HPI:    Jacob Davila is a 46 y.o. male with a PMH notable for significant BILATERAL SUPERFICIAL AND DEEP VENOUS INSUFFICIENCY, Chronic Pain below who presents today for follow-up and discussion of Palpitations, Dizziness.  I last saw Jacob Davila on July 01, 2021 to discuss results of lower extremity venous Dopplers, echo and his event monitor.  He noted he was feeling better.  Legs are hurting but doing better with support stockings.  Try to elevate his feet.  Still having palpitation episodes.  Recommended that he consider obtaining [facility]. => Referred to vascular surgery -> they recommended thigh-high 20-30 mmHg stockings.  Consider the possibility of vein ablation.  Referred to Dr. Gae Gallop.  Jacob Davila was last seen on August 31, 2021 by Roby Lofts, PA -> obtained [facility] monitor.  Still having episodes of tachycardia with dizziness and pallor.  These episodes tend to happen while sitting recliner or from his feet.  No syncope near syncope.  Last about 10 to 20  minutes. Recommended diltiazem 120 mg daily.  Recent Hospitalizations: None  He was referred to Dr. Deitra Mayo from vascular surgery for management of his varicose veins.  Symptoms most notable in the left leg but present on both.  He was noted to have both deep and superficial venous reflux.  He spent a long time discussing the importance of leg elevation and the proper positioning.  Recommended continued use of support  compression stockings.  (Thigh-high may work better, but knee-high may be more practical).  Avoid prolonged sitting or standing.  Walking and water aerobics. -> Unfortunately, not a very ideal candidate for laser ablation of the left greater saphenous vein because of noted significant reflux at the saphenofemoral junction.  This may progress, at which point ablation could be considered.  Also noted back and hip pain.   Reviewed  CV studies:    The following studies were reviewed today: (if available, images/films reviewed: From Epic Chart or Care Everywhere) Transthoracic Echo 05/2021: EF 60 to 65%.  Normal LV function.  No RWMA.  Interval History:   Jacob Davila presents today walking with a significant antalgic gait favoring his left leg.  He has an significant mount of pain in his back and legs.  States that he is not getting any relief.  Although his edema seems to be pretty well controlled with the compression stockings.  We reviewed his [place] tracings in the clinic today => for the most part, sinus rhythm with occasional ectopy noted.  Recorded heart rates can be in the high 90s to low 120s.  He states that the palpitations are still bothersome.  Swelling is still present.  He having lightheaded spells with near syncope, but no syncope.  No chest pain or pressure.  No PND or orthopnea.  CV Review of Symptoms (Summary) Cardiovascular ROS: no chest pain or dyspnea on exertion positive for - edema, irregular heartbeat, palpitations, rapid heart rate, and associated dizziness and near syncope negative for - loss of consciousness, orthopnea, paroxysmal nocturnal dyspnea, shortness of breath, or TIA or amaurosis fugax; claudication  REVIEWED OF SYSTEMS   Review of Systems  Constitutional:  Positive for malaise/fatigue (Very tired at end of the day, legs feel heavy.  Pain significant).  HENT:  Negative for congestion and nosebleeds.   Respiratory:  Negative for cough and shortness  of breath.   Cardiovascular:  Positive for leg swelling.  Gastrointestinal:  Negative for blood in stool.  Musculoskeletal:  Positive for back pain and joint pain (Left hip). Negative for falls and myalgias.  Neurological:  Positive for dizziness, tingling (Distal extremities) and weakness (Legs feel heavy and weak). Negative for tremors, focal weakness and loss of consciousness.  Psychiatric/Behavioral:  Negative for depression (Not Nestl depressed, but very upset about his current condition.) and memory loss. The patient has insomnia (Has a hard time sleeping because of discomfort). The patient is not nervous/anxious.    I have reviewed and (if needed) personally updated the patient's problem list, medications, allergies, past medical and surgical history, social and family history.   PAST MEDICAL HISTORY   Past Medical History:  Diagnosis Date   Scoliosis    - s/p correctvie Sgx   Venous insufficiency of both lower extremities 05/2021   Lower extremity venous Doppler 05/29/2021: No evidence of DVT.  Venous reflux noted in the right GSV in the calf.  Venous reflux noted in the left common femoral vein and left greater saphenous vein in the thigh.  Left GSV  in the calf as well as a left perforator vein. => Forwarded to vascular surgery:    PAST SURGICAL HISTORY   Past Surgical History:  Procedure Laterality Date   BACK SURGERY  03/1990   Scoliosis   TRANSTHORACIC ECHOCARDIOGRAM  06/08/2019   EF 60-65%.  Normal wall motion.  Normal relaxation.  Mild aortic valve thickening but no sclerosis/stenosis.  Normal echo    There is no immunization history on file for this patient.  MEDICATIONS/ALLERGIES   Current Meds  Medication Sig   diphenhydrAMINE (BENADRYL) 25 mg capsule Take 25 mg by mouth daily as needed for allergies.   furosemide (LASIX) 20 MG tablet Take 1 tablet (20 mg total) by mouth daily. And/Or as directed   ibuprofen (ADVIL) 200 MG tablet Take 400 mg by mouth every 6 (six)  hours as needed for mild pain.   metoprolol succinate (TOPROL XL) 25 MG 24 hr tablet Take 1 tablet (25 mg total) by mouth at bedtime.   [DISCONTINUED] diltiazem (CARDIZEM CD) 120 MG 24 hr capsule Take 1 capsule (120 mg total) by mouth daily.    Allergies  Allergen Reactions   Darvon [Propoxyphene]    Tessalon [Benzonatate]    Latex Rash   Morphine And Related Rash    SOCIAL HISTORY/FAMILY HISTORY   Reviewed in Epic:  Pertinent findings:  Social History   Tobacco Use   Smoking status: Some Days    Types: Cigars   Smokeless tobacco: Never  Vaping Use   Vaping Use: Never used  Substance Use Topics   Alcohol use: Not Currently   Drug use: Never   Social History   Social History Narrative   Reports he does smoke cigarettes, but denies alcohol use or use of other drugs.  He reports the only medication he takes is Benadryl, and that he takes approximately 2 tablets/day.  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  He has never had symptoms like this before.      OBJCTIVE -PE, EKG, labs   Wt Readings from Last 3 Encounters:  12/31/21 152 lb (68.9 kg)  09/29/21 154 lb (69.9 kg)  09/15/21 157 lb 9.6 oz (71.5 kg)    Physical Exam: BP 124/68    Pulse 85    Ht 5\' 11"  (1.803 m)    Wt 152 lb (68.9 kg)    SpO2 98%    BMI 21.20 kg/m  Physical Exam Vitals reviewed.  Constitutional:      Appearance: Normal appearance. He is normal weight.  HENT:     Head: Normocephalic and atraumatic.  Neck:     Vascular: No carotid bruit or JVD.  Cardiovascular:     Rate and Rhythm: Normal rate and regular rhythm. Occasional Extrasystoles are present.    Chest Wall: PMI is not displaced.     Pulses: Decreased pulses (Diminished partially because of support stockings and mild swelling.).     Heart sounds: S1 normal and S2 normal. No murmur  heard.   No friction rub. No gallop.  Pulmonary:     Effort: Pulmonary effort is normal. No respiratory distress.     Breath sounds: Normal breath sounds. No wheezing, rhonchi or rales.  Chest:     Chest wall: No tenderness.  Musculoskeletal:     Cervical back: Normal range of motion and neck supple.  Comments: Wearing support stockings bilaterally, trace to 1+ edema in the ankles but minimal calf swelling.  Skin:    General: Skin is warm and dry.  Neurological:     General: No focal deficit present.     Mental Status: He is alert and oriented to person, place, and time.     Gait: Gait abnormal (Significant antalgic gait, favoring the left leg.).  Psychiatric:        Behavior: Behavior normal.        Thought Content: Thought content normal.        Judgment: Judgment normal.     Comments: Very depressed mood     Adult ECG Report  Rate: 85 ;  Rhythm: normal sinus rhythm and left atrial enlargement otherwise normal axis, intervals and durations. ;   Narrative Interpretation: Stable  Recent Labs: Reviewed. No results found for: CHOL, HDL, LDLCALC, LDLDIRECT, TRIG, CHOLHDL Lab Results  Component Value Date   CREATININE 1.19 09/15/2021   BUN 12 09/15/2021   NA 139 09/15/2021   K 4.6 09/15/2021   CL 102 09/15/2021   CO2 22 09/15/2021   CBC Latest Ref Rng & Units 09/15/2021 04/19/2021 04/19/2021  WBC 3.4 - 10.8 x10E3/uL 4.2 - 6.1  Hemoglobin 13.0 - 17.7 g/dL 11.9(L) 15.0 12.5(L)  Hematocrit 37.5 - 51.0 % 35.1(L) 44.0 37.5(L)  Platelets 150 - 450 x10E3/uL 212 - 183    No results found for: HGBA1C Lab Results  Component Value Date   TSH 1.632 04/19/2021    ==================================================  COVID-19 Education: The signs and symptoms of COVID-19 were discussed with the patient and how to seek care for testing (follow up with PCP or arrange E-visit).    I spent a total of 33 minutes with the patient spent in direct patient consultation.  Additional time  spent with chart review  / charting (studies, outside notes, etc): 15 min Total Time: 48 min  Current medicines are reviewed at length with the patient today.  (+/- concerns) none  This visit occurred during the SARS-CoV-2 public health emergency.  Safety protocols were in place, including screening questions prior to the visit, additional usage of staff PPE, and extensive cleaning of exam room while observing appropriate contact time as indicated for disinfecting solutions.  Notice: This dictation was prepared with Dragon dictation along with smart phrase technology. Any transcriptional errors that result from this process are unintentional and may not be corrected upon review.  Studies Ordered:   Orders Placed This Encounter  Procedures   EKG 12-Lead    Patient Instructions / Medication Changes & Studies & Tests Ordered   Patient Instructions  Call the end of Feb 2023 for an AUG 2023 appointment.  Medication Instructions:   Stop taking  Diltiazem   Metoprolol succinate 25 mg at bedtime    Start taking Lasix ( furosemide ) 20 mg at lunch time for next 2 weeks , if symptoms are not better you can take as needed for swelling , shortness of breath.  *If you need a refill on your cardiac medications before your next appointment, please call your pharmacy*   Lab Work: Not needed    Testing/Procedures:  Not needed  Follow-Up: At [facility], you and your health needs are our priority.  As part of our continuing mission to provide you with exceptional heart care, we have created designated Provider Care Teams.  These Care Teams include your primary Cardiologist (physician) and Advanced Practice Providers (APPs -  Physician Assistants and Nurse  Practitioners) who all work together to provide you with the care you need, when you need it.     Your next appointment:   6 month(s)  Aug 2023      The format for your next appointment:   In Person  Provider:   Glenetta Hew,  MD     Glenetta Hew, M.D., M.S. Interventional Cardiologist   Pager # [phone] Phone # [phone] [address] [place], [place] [address]   Thank you for choosing Heartcare at [facility]!!

## 2021-12-31 NOTE — Patient Instructions (Addendum)
Call the end of Feb 2023 for an AUG 2023 appointment.  Medication Instructions:   Stop taking  Diltiazem   Metoprolol succinate 25 mg at bedtime    Start taking Lasix ( furosemide ) 20 mg at lunch time for next 2 weeks , if symptoms are not better you can take as needed for swelling , shortness of breath.  *If you need a refill on your cardiac medications before your next appointment, please call your pharmacy*   Lab Work: Not needed    Testing/Procedures:  Not needed  Follow-Up: At [facility], you and your health needs are our priority.  As part of our continuing mission to provide you with exceptional heart care, we have created designated Provider Care Teams.  These Care Teams include your primary Cardiologist (physician) and Advanced Practice Providers (APPs -  Physician Assistants and Nurse Practitioners) who all work together to provide you with the care you need, when you need it.     Your next appointment:   6 month(s)  Aug 2023      The format for your next appointment:   In Person  Provider:   Glenetta Hew, MD

## 2022-01-01 ENCOUNTER — Encounter: Payer: Self-pay | Admitting: Cardiology

## 2022-01-01 NOTE — Assessment & Plan Note (Signed)
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  He is wearing compression stockings and edema seems to be well controlled.  He should continue to follow-up with Dr. Gae Gallop with reflux studies in a year or so to see if there is progression of disease which would allow them to consider left-sided greater saphenous vein ablation.  Since he still has some swelling, we will provide low-dose Lasix to help with volume removal.  This should not take away the fact that he needs to stay adequately hydrated.  Discussed importance of foot elevation, reiterated vascular surgical recommendations.

## 2022-01-01 NOTE — Assessment & Plan Note (Signed)
His home monitor confirms what was seen on our monitor.  Unfortunately there are no true arrhythmias.  We talked about vagal maneuvers and adequate hydration.  I do think that the compression stockings should help as should foot elevation.  We are going to provide low-dose diuretic to help with some edema but he needs to drink more than he he is. Starting low-dose beta-blocker.

## 2022-01-01 NOTE — Assessment & Plan Note (Signed)
Is Home monitor suggests sinus tachycardia as the main etiology.  I did not see any significant arrhythmias or for that matter even ectopy.  I suspect that some of this is driven by pain, and potentially even orthostasis with his edema.  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

## 2022-01-03 ENCOUNTER — Other Ambulatory Visit: Payer: Self-pay | Admitting: Medical

## 2022-01-05 NOTE — Progress Notes (Signed)
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 Must exclude other chronic causes of hepatocellular inflammation that can mimic fatty liver on ultrasound. Had negative Hep panels, negative thyroid recently - Labs to include: iron, ferritin, total iron binding capacity, IgG, ANA, Antismooth muscle antibody, alpha-one-antitrypsin level - will repeat AB [place] complete to evaluate liver again and evaluate for any signs of portal hypertension- no history of thrombocytopenia - Abstain from all alcohol including beer, wine, liquor, and non-alcoholic beer.  - Work to maintain a health weight through portion control and exercise Maximize control of any hypergycemia and hyperlipidemia -     Hepatic function panel; Future -     Protime-INR; Future -     Iron and TIBC; Future -     IgG; Future -     ANA; Future -     Anti-smooth muscle antibody, IgG; Future -     Mitochondrial antibodies; Future -     Ceruloplasmin; Future -     Alpha-1-antitrypsin; Future -     Ferritin; Future -     US Abdomen Complete; Future  Dysphagia, unspecified type No GERD, ? With "swelling", if no portal HTN evidence low likelyhood of varices. Avoid NSAIDs, alcohol. We will schedule for endoscopy, if this is negative we will schedule for barium swallow to evaluate motility. I discussed risks of EGD with patient today, including risk of sedation, bleeding or perforation.  Patient provides understanding and gave verbal consent to proceed.  Macrocytic anemia -     CBC with Differential/Platelet; Future  Screen for colon cancer No symptoms, will schedule screening colonoscopy with endoscopy as patient is due. We have discussed the risks of bleeding, infection, perforation, medication reactions, a 10-20% miss rate for small colon cancer or polyp and remote risk of death associated with  colonoscopy. All questions were answered and the patient acknowledges these risk and wishes to proceed.    No future appointments.   Patient Care Team: [place], Blondell Reveal, MD as PCP - General (Family Medicine) Leonie Man, MD as PCP - Cardiology (Cardiology)  HISTORY OF PRESENT ILLNESS: 46 y.o. male referred by [place], Blondell Reveal, MD, with a past medical history of hepatic steatosis with elevated LFTs, paroxysmal tachycardia, macrocytic anemia and others listed below presents for evaluation of dysphagia.   Will have his "whole body swell up" and when he swallows it is "uncomfortable" and food barely slides down but denies dysphagia or food/liquid getting caught.   Has swelling "from top down" every day unless he is sitting down.  Denies reflux unless associated with food.  Weight is unchanged. Denies Nausea or vomiting.  The patient denies any issues with jaundice, scleral icterus, generalized pruritus, darkened/amber urine, clay-colored stools, hematemesis, coffee-ground emesis, abdominal distention, confusion. Denies diarrhea, constipation, melena, hematochezia.  Smokes several cigars a week for years.  Will take advil will take 2 for pain almost daily since he hurts all over with his swelling.  Works rotating shifts, will have 2 beers 3 days a week.  Has history of tachycardia but doing better on medications.  denies  family history of liver disease or GI malignancy.   AST 150, ALT 105, Alk phos 137, GGT 659, albumin 3.8, total bilirubin 0.4,  iron 119 Thyroid 1.6 hepatitis B surface antigen negative core antibody negative, hepatitis C antibody negative Hemoglobin 11.9, MCV 104, B12 416 folate  3.4 Abdominal ultrasound right upper quadrant 06/24/2021 mild increased echogenicity in the liver without focal mass often seen in hepatic steatosis no other abnormalities  Current Medications:    Current Outpatient Medications (Cardiovascular):    furosemide (LASIX) 20 MG tablet,  Take 1 tablet (20 mg total) by mouth daily. And/Or as directed   metoprolol succinate (TOPROL XL) 25 MG 24 hr tablet, Take 1 tablet (25 mg total) by mouth at bedtime.  Current Outpatient Medications (Respiratory):    diphenhydrAMINE (BENADRYL) 25 mg capsule, Take 25 mg by mouth daily as needed for allergies.  Current Outpatient Medications (Analgesics):    ibuprofen (ADVIL) 200 MG tablet, Take 400 mg by mouth every 6 (six) hours as needed for mild pain.    Medical History:  Past Medical History:  Diagnosis Date   Scoliosis    - s/p correctvie Sgx   Venous insufficiency of both lower extremities 05/2021   Lower extremity venous Doppler 05/29/2021: No evidence of DVT.  Venous reflux noted in the right GSV in the calf.  Venous reflux noted in the left common femoral vein and left greater saphenous vein in the thigh.  Left GSV in the calf as well as a left perforator vein. => Forwarded to vascular surgery:   Allergies:  Allergies  Allergen Reactions   Darvon [Propoxyphene]    Tessalon [Benzonatate]    Latex Rash   Morphine And Related Rash     Surgical History:  He  has a past surgical history that includes Back surgery (03/1990) and transthoracic echocardiogram (06/08/2019). Family History:  His family history includes Atrial fibrillation in his mother; CAD in his paternal grandfather; Heart failure in his mother; Hyperlipidemia in his father; Hypertension in his father. Social History:   reports that he has been smoking cigars. He has never used smokeless tobacco. He reports current alcohol use. He reports that he does not use drugs.  REVIEW OF SYSTEMS  : All other systems reviewed and negative except where noted in the History of Present Illness.   PHYSICAL EXAM: BP (!) 104/58    Pulse 84    Ht '5\' 11"'  (1.803 m)    Wt 152 lb (68.9 kg)    SpO2 98%    BMI 21.20 kg/m  General:   Pleasant, well developed male in no acute distress Head:  Normocephalic and atraumatic. Eyes: sclerae  anicteric,conjunctive pink  Heart:  regular rate and rhythm Pulm: Clear anteriorly; no wheezing Abdomen:  Soft,  non distended, no fluid wave, no dullness in flanks  AB, skin exam hernia visible- umbilical, Normal bowel sounds.  no  tenderness . Without guarding and Without rebound, without hepatomegaly. Extremities:  Without edema, wearing compression socks.  Msk:  Symmetrical without gross deformities. Peripheral pulses intact.  Neurologic:  Alert and  oriented x4;  grossly normal neurologically. Negative for asterixis bilateral hands Skin:   Dry and intact without significant lesions or rashes. Psychiatric: Demonstrates good judgement and reason without abnormal affect or behaviors.   Vladimir Crofts, PA-C 12:19 PM

## 2022-01-06 ENCOUNTER — Ambulatory Visit: Payer: Managed Care, Other (non HMO) | Admitting: Physician Assistant

## 2022-01-06 ENCOUNTER — Encounter: Payer: Self-pay | Admitting: Physician Assistant

## 2022-01-06 ENCOUNTER — Other Ambulatory Visit (INDEPENDENT_AMBULATORY_CARE_PROVIDER_SITE_OTHER): Payer: Managed Care, Other (non HMO)

## 2022-01-06 VITALS — BP 104/58 | HR 84 | Ht 71.0 in | Wt 152.0 lb

## 2022-01-06 DIAGNOSIS — R1310 Dysphagia, unspecified: Secondary | ICD-10-CM

## 2022-01-06 DIAGNOSIS — R7989 Other specified abnormal findings of blood chemistry: Secondary | ICD-10-CM

## 2022-01-06 DIAGNOSIS — D539 Nutritional anemia, unspecified: Secondary | ICD-10-CM

## 2022-01-06 DIAGNOSIS — Z1211 Encounter for screening for malignant neoplasm of colon: Secondary | ICD-10-CM | POA: Diagnosis not present

## 2022-01-06 LAB — HEPATIC FUNCTION PANEL
ALT: 73 U/L — ABNORMAL HIGH (ref 0–53)
AST: 95 U/L — ABNORMAL HIGH (ref 0–37)
Albumin: 4.6 g/dL (ref 3.5–5.2)
Alkaline Phosphatase: 104 U/L (ref 39–117)
Bilirubin, Direct: 0.2 mg/dL (ref 0.0–0.3)
Total Bilirubin: 0.8 mg/dL (ref 0.2–1.2)
Total Protein: 7.6 g/dL (ref 6.0–8.3)

## 2022-01-06 LAB — CBC WITH DIFFERENTIAL/PLATELET
Basophils Absolute: 0.1 10*3/uL (ref 0.0–0.1)
Basophils Relative: 2.4 % (ref 0.0–3.0)
Eosinophils Absolute: 0.2 10*3/uL (ref 0.0–0.7)
Eosinophils Relative: 3.8 % (ref 0.0–5.0)
HCT: 39.2 % (ref 39.0–52.0)
Hemoglobin: 13.5 g/dL (ref 13.0–17.0)
Lymphocytes Relative: 36.7 % (ref 12.0–46.0)
Lymphs Abs: 1.8 10*3/uL (ref 0.7–4.0)
MCHC: 34.4 g/dL (ref 30.0–36.0)
MCV: 105.2 fl — ABNORMAL HIGH (ref 78.0–100.0)
Monocytes Absolute: 0.6 10*3/uL (ref 0.1–1.0)
Monocytes Relative: 13.1 % — ABNORMAL HIGH (ref 3.0–12.0)
Neutro Abs: 2.1 10*3/uL (ref 1.4–7.7)
Neutrophils Relative %: 44 % (ref 43.0–77.0)
Platelets: 260 10*3/uL (ref 150.0–400.0)
RBC: 3.72 Mil/uL — ABNORMAL LOW (ref 4.22–5.81)
RDW: 13.2 % (ref 11.5–15.5)
WBC: 4.8 10*3/uL (ref 4.0–10.5)

## 2022-01-06 LAB — PROTIME-INR
INR: 1 ratio (ref 0.8–1.0)
Prothrombin Time: 11.3 s (ref 9.6–13.1)

## 2022-01-06 LAB — FERRITIN: Ferritin: 125.1 ng/mL (ref 22.0–322.0)

## 2022-01-06 NOTE — Patient Instructions (Signed)
If you are age 47 or younger, your body mass index should be between 19-25. Your Body mass index is 21.2 kg/m. If this is out of the aformentioned range listed, please consider follow up with your Primary Care Provider.  ________________________________________________________  The [facility] providers would like to encourage you to use [facility] to communicate with providers for non-urgent requests or questions.  Due to long hold times on the telephone, sending your provider a message by [facility] may be a faster and more efficient way to get a response.  Please allow 48 business hours for a response.  Please remember that this is for non-urgent requests.  _______________________________________________________  Your provider has requested that you go to the basement level for lab work before leaving today. Press "B" on the elevator. The lab is located at the first door on the left as you exit the elevator.  Due to recent changes in healthcare laws, you may see the results of your imaging and laboratory studies on MyChart before your provider has had a chance to review them.  We understand that in some cases there may be results that are confusing or concerning to you. Not all laboratory results come back in the same time frame and the provider may be waiting for multiple results in order to interpret others.  Please give [place] 48 hours in order for your provider to thoroughly review all the results before contacting the office for clarification of your results.   You have been scheduled for an endoscopy and colonoscopy. Please follow the written instructions given to you at your visit today. Please pick up your prep supplies at the pharmacy within the next 1-3 days. If you use inhalers (even only as needed), please bring them with you on the day of your procedure.  You have been scheduled for an abdominal ultrasound at [facility] (1st floor of hospital) on 01-13-22 at 8:00am. Please arrive  57minutes prior to your appointment for registration. Make certain not to have anything to eat or drink 6 hours prior to your appointment. Should you need to reschedule your appointment, please contact radiology at [phone]. This test typically takes about 30 minutes to perform.  Thank you for entrusting me with your care and choosing [facility].  Irving Shows, C

## 2022-01-06 NOTE — Progress Notes (Signed)
I agree with assessment and plan as outlined by Ms. Collier. 

## 2022-01-07 LAB — IRON AND TIBC
Iron Saturation: 21 % (ref 15–55)
Iron: 81 ug/dL (ref 38–169)
Total Iron Binding Capacity: 380 ug/dL (ref 250–450)
UIBC: 299 ug/dL (ref 111–343)

## 2022-01-12 LAB — MITOCHONDRIAL ANTIBODIES: Mitochondrial M2 Ab, IgG: <=20 U (ref <=20.0)

## 2022-01-12 LAB — ANA: Anti Nuclear Antibody (ANA): NEGATIVE

## 2022-01-12 LAB — IGG: IgG (Immunoglobin G), Serum: 969 mg/dL (ref 600–1640)

## 2022-01-12 LAB — ALPHA-1-ANTITRYPSIN: A-1 Antitrypsin, Ser: 139 mg/dL (ref 83–199)

## 2022-01-12 LAB — ANTI-SMOOTH MUSCLE ANTIBODY, IGG: Actin (Smooth Muscle) Antibody (IGG): <20 U (ref <20)

## 2022-01-12 LAB — CERULOPLASMIN: Ceruloplasmin: 22 mg/dL (ref 18–36)

## 2022-01-13 ENCOUNTER — Other Ambulatory Visit: Payer: Self-pay

## 2022-01-13 ENCOUNTER — Ambulatory Visit (HOSPITAL_COMMUNITY)
Admission: RE | Admit: 2022-01-13 | Discharge: 2022-01-13 | Disposition: A | Discharge status: Home / Self Care | Payer: Managed Care, Other (non HMO) | Source: Ambulatory Visit | Attending: Physician Assistant | Admitting: Physician Assistant

## 2022-01-13 DIAGNOSIS — R7989 Other specified abnormal findings of blood chemistry: Secondary | ICD-10-CM | POA: Diagnosis present

## 2022-02-21 ENCOUNTER — Ambulatory Visit (AMBULATORY_SURGERY_CENTER): Payer: Managed Care, Other (non HMO) | Admitting: Internal Medicine

## 2022-02-21 ENCOUNTER — Telehealth: Payer: Self-pay | Admitting: *Deleted

## 2022-02-21 ENCOUNTER — Encounter: Payer: Self-pay | Admitting: Internal Medicine

## 2022-02-21 VITALS — BP 116/75 | HR 81 | Temp 97.0°F | Resp 26 | Ht 71.0 in | Wt 152.0 lb

## 2022-02-21 DIAGNOSIS — D125 Benign neoplasm of sigmoid colon: Secondary | ICD-10-CM

## 2022-02-21 DIAGNOSIS — K2950 Unspecified chronic gastritis without bleeding: Secondary | ICD-10-CM

## 2022-02-21 DIAGNOSIS — R1310 Dysphagia, unspecified: Secondary | ICD-10-CM

## 2022-02-21 DIAGNOSIS — K259 Gastric ulcer, unspecified as acute or chronic, without hemorrhage or perforation: Secondary | ICD-10-CM

## 2022-02-21 DIAGNOSIS — K2970 Gastritis, unspecified, without bleeding: Secondary | ICD-10-CM

## 2022-02-21 DIAGNOSIS — K319 Disease of stomach and duodenum, unspecified: Secondary | ICD-10-CM

## 2022-02-21 DIAGNOSIS — Z1211 Encounter for screening for malignant neoplasm of colon: Secondary | ICD-10-CM | POA: Diagnosis not present

## 2022-02-21 DIAGNOSIS — R7989 Other specified abnormal findings of blood chemistry: Secondary | ICD-10-CM

## 2022-02-21 HISTORY — PX: UPPER GASTROINTESTINAL ENDOSCOPY: SHX188

## 2022-02-21 MED ORDER — HYDROCORTISONE ACETATE 25 MG RE SUPP: 25.0000 mg | Freq: Two times a day (BID) | RECTAL | 0 refills | Status: DC

## 2022-02-21 MED ORDER — SODIUM CHLORIDE 0.9 % IV SOLN: 500.0000 mL | Freq: Once | INTRAVENOUS | Status: DC

## 2022-02-21 MED ORDER — OMEPRAZOLE 40 MG PO CPDR: 40.0000 mg | DELAYED_RELEASE_CAPSULE | Freq: Two times a day (BID) | ORAL | 0 refills | Status: DC

## 2022-02-21 NOTE — Op Note (Signed)
[place] ?Patient Name: Jacob Davila ?Procedure Date: 02/21/2022 10:49 AM ?MRN: [id number] ?Endoscopist: Sonny Masters "[name] ,  ?Age: 46 ?Referring MD:  ?Date of Birth: 15-Nov-1976 ?Gender: Male ?Account #: [id number] ?Procedure:                Colonoscopy ?Indications:              Screening for colorectal malignant neoplasm ?Medicines:                Monitored Anesthesia Care ?Procedure:                Pre-Anesthesia Assessment: ?                          - Prior to the procedure, a History and Physical  ?                          was performed, and patient medications and  ?                          allergies were reviewed. The patient's tolerance of  ?                          previous anesthesia was also reviewed. The risks  ?                          and benefits of the procedure and the sedation  ?                          options and risks were discussed with the patient.  ?                          All questions were answered, and informed consent  ?                          was obtained. Prior Anticoagulants: The patient has  ?                          taken no previous anticoagulant or antiplatelet  ?                          agents. ASA Grade Assessment: II - A patient with  ?                          mild systemic disease. After reviewing the risks  ?                          and benefits, the patient was deemed in  ?                          satisfactory condition to undergo the procedure. ?                          After obtaining informed consent, the colonoscope  ?  was passed under direct vision. Throughout the  ?                          procedure, the patient's blood pressure, pulse, and  ?                          oxygen saturations were monitored continuously. The  ?                          CF [id number] #[id number] was introduced through the anus  ?                          and advanced to the the cecum, identified by  ?                          appendiceal orifice  and ileocecal valve. The  ?                          colonoscopy was performed without difficulty. The  ?                          patient tolerated the procedure well. The quality  ?                          of the bowel preparation was good. The ileocecal  ?                          valve, appendiceal orifice, and rectum were  ?                          photographed. ?Scope In: 11:13:35 AM ?Scope Out: 11:35:32 AM ?Scope Withdrawal Time: 0 hours 10 minutes 31 seconds  ?Total Procedure Duration: 0 hours 21 minutes 57 seconds  ?Findings:                 Multiple diverticula were found in the ascending  ?                          colon and cecum. ?                          Two sessile polyps were found in the sigmoid colon.  ?                          The polyps were 3 to 5 mm in size. These polyps  ?                          were removed with a cold snare. Resection and  ?                          retrieval were complete. ?                          Non-bleeding internal hemorrhoids were found during  ?  retroflexion. ?Complications:            No immediate complications. ?Estimated Blood Loss:     Estimated blood loss was minimal. ?Impression:               - Diverticulosis in the ascending colon and in the  ?                          cecum. ?                          - Two 3 to 5 mm polyps in the sigmoid colon,  ?                          removed with a cold snare. Resected and retrieved. ?                          - Non-bleeding internal hemorrhoids. ?Recommendation:           - Discharge patient to home (with escort). ?                          - Await pathology results. ?                          - The findings and recommendations were discussed  ?                          with the patient. ?Georgian Co,  ?02/21/2022 11:52:45 AM ?

## 2022-02-21 NOTE — Progress Notes (Signed)
Pt to repeat EGD - Gastric ulcer in 8 weeks.  04/25/22 EGD recheck 8 weeks to check healing of gastric ulcer.  Judson Roch Monday, RN scheduled and went over previsit instructions. ? ?Vocal order for anusol [facility] suppositories # 14 BID for 7 days. ?Also Rx for OMEPRAZOLE 40 mg BID for 8 weeks.  Both were sent to [facility]. ? ?Pt was HIPAA.   ? ?No problems noted in the recovery room. maw  ?

## 2022-02-21 NOTE — Progress Notes (Deleted)
NAME: Jacob Davila   ?DOB: [birth year] ?MRN: [id number] Procedure Date: *** ?Arrival Time: *** ?Procedure Time: ***  ? ?Location of Procedure: [facility] Registration ? ? ?_______________________________________________________________________ ? ?PREPARATION FOR COLONOSCOPY WITH CLENPIQ ? ?On *** (5 days prior to the procedure) stop eating nuts, seeds, popcorn, corn, beans, peas, salads, or any raw vegetables.  Do not take any fiber supplements (e.g. Metamucil, Citrucel, and Benefiber). ? ?Please pick up your prep from the pharmacy within 2-3 days of getting your prep instructions.  Please do NOT wait until the day before your procedure to pick it up as this will not give our office sufficient time to assist should pharmacy issues arise. ? ?______________________________________________________________________ ? ?THE DAY BEFORE PROCEDURE:   DATE: ***   DAY: {Time; days of week:30300} ? ?1. Drink clear liquids the entire day - NO SOLID FOOD ? ?2. Do not drink anything colored red or purple. Avoid juices with pulp. No orange juice, grapefruit, or tomato juice. ? ?3. Drink at least 64 oz. (8 glasses) of fluid/clear liquids during the day to prevent dehydration and help the prep work efficiently. ? ?Clear liquids include: (NO RED,NO PURPLE)   ? ?Water Jello  ?Ice Popsicles  ?Tea (sugar ok, no milk/cream) Powdered fruit flavored drinks  ?Coffee (sugar ok, no milk/cream) Gatorade  ?Juice: apple, white grape, white cranberry Lemonade  ?Clear bullion, consomme, broth Carbonated beverages (any kind)  ?Strained chicken noodle soup  ?(no noodles or chicken) Hard Candy  ? ? ?FOLLOW THESE PREP INSTRUCTIONS, NOT INSTRUCTIONS ON CLENPIQ BOX. ? ?At 6:00pm:  ? ?Step 1: Drink one 5.4 oz bottle of CLENPIQ ?Step 2: Drink five (5) 8 oz. glasses of clear liquid over the next 3 hours. You may continue to drink additional clear liquids until bedtime. ? ?ONLY DRINK ONE BOTTLE ( OR HALF)  OF PREP THE EVENING BEFORE YOUR PROCEDURE.   THE SECOND BOTTLE (OR HALF) MUST BE CONSUMED 6 HOURS BEFORE THE PROCEDURE ON THE DAY OF THE COLONOSCOPY. ? ?If you have NOT had a bowel movement by 9-10 pm, please call the on call doctor at [phone]. ?Do not wait until you arrive for your procedure to notify us your prep did not work. ? ?________________________________________________________________________ ? ?DAY OF PROCEDURE:   DATE: ***       DAY: {Time; days of week:30300} ? ?STAY ON CLEAR LIQUIDS, NO SOLID FOOD ? ?Beginning at  ***  (6 hours before the procedure):   ? ?Follow the instructions below.  ? ?Step 1:Drink the second 5.4 oz bottle of CLENPIQ ?Step 2: Drink five (5) 8 oz glasses of clear liquids over the next 2 hours ? ?You should have nothing by mouth after *** (4 hours prior to procedure) ? ?________________________________________________________________________ ? ?MEDICATION INSTRUCTIONS ? ?You will be contacted by the hospital endoscopy staff prior to your procedure with instructions on what medications to take prior to your procedure.  ? ?You should NOT stop any blood thinners, including Aspirin , unless instructed to do so by your gastroenterologist.  ? ?Take allowed medicines as directed by the Hospital ? ?Diabetic patients - You will be contacted by the hospital endoscopy staff prior to your procedure with instructions on what medications to take prior to your procedure.  ? ?Stop taking Plavix or Aggrenox on *** (7 days before procedure) ? ?Stop taking Coumadin on *** (5 days before procedure) ? ? If you use any type of inhaler please bring them with you to your procedure.  ? ?Call office (  [phone]) if you have fever 2 days before procedure ? ?PLEASE DO NOT WEAR ANY SCENTED LOTIONS, PERFUMES, OR COLOGNES THE DAY OF YOUR PROCEDURE ? ? ?CARE PARTNER   ?You will need a responsible adult at least 47 years of age to act as your care partner on the day of your procedure. This person needs to arrive with you to the facility, stay there during  your procedure and then drive you home afterwards.  We cannot start your procedure unless your care partner is present in the facility. The total time from sign in until discharge is approximately 2-3 hours.  Before you leave, your doctor will review the findings and recommendations with you (and your care partner, if you give permission). ? ?WHAT TO WEAR/BRING   ?Wear loose fitting clothing that is easily removed. Do not wear any lotions, perfumes or colognes. You may wear deodorant. Leave jewelry and other valuables at home. You may wish to bring a book to read as you wait for your procedure to start.  Belongings will be given to care partner to keep while you are in the procedure room.  Remove all body piercing jewelry and leave at home.  You should not wear any red or dark colored fingernail polish. ? ?WHAT TO EXPECT AFTER THE PROCEDURE   ?You may experience some feelings of bloating in the abdomen or passage of more gas than usual.  Walking can help get rid of the air that was put into your GI tract during the procedure and reduce the bloating. You may notice spotting of blood in your stool or on the toilet paper. Since you completed a bowel prep for your procedure you may not have a normal bowel movement for a few days. ? ?DIET   ?In general, your first meal following the procedure should be a small meal after which it is okay to return to your normal diet.  A half-sandwich or bowl of soup is an example of a good first meal.  Heavy or fried foods are harder to digest and may make you feel nauseous or bloated.  Drink plenty of fluids but you should avoid alcoholic beverages for 24 hours. These diet instructions may be modified depending on the results of your procedure. ? ?ACTIVITY   ?Your care partner should take you home directly after the procedure.  You should plan to take it easy, moving slowly for the rest of the day.  You can resume normal activity the day after the procedure however you should NOT DRIVE  or use heavy machinery until the next day (because of the sedation medicines used during the procedure).   ? ?If any biopsies are taken during your procedure, you will be contacted by phone or by letter within the following 2-3 weeks.  Please call your gastroenterologist at [phone] if you have not heard about the biopsies in 3 weeks.  ? ?FREQUENTLY ASKED QUESTIONS ?SHOULD I STOP MY ASPIRIN? No, you should not stop any blood thinners, including Aspirin, unless instructed to do so by your gastroenterologist. ?WHEN DO I START THE PREP? ?Please refer to your personalized prep instructions regarding the start time.  We realize, however, that you may work until later than this time. If so, you can begin taking your preparation as soon as you get home.  Remember, the later you start, the later you will be up going to the bathroom. ?WHAT IF I GET SICK DURING THE PREP?  ?Stop drinking the solution and wait for 30-45 minutes.  Let your system settle down. Try drinking small sips of Coke or another clear liquid beverage.  Begin the solution again, using some of the suggestions above if the flavor is the problem.  ?WHEN WILL THE PREP START TO WORK?  ?Everyone is different in the amount of time that it takes the purge (laxative) to work. Some people begin to stool in the first hour, others not until the fourth hour or later. Activity is helpful in stimulating the bowel so, if possible, do not sit and wait for the bowel to act - remain active.  ?DO I HAVE TO DRINK ALL OF THE PREP?  ?Yes.  In order to give you the best examination possible, it is important to drink all of the solution in the set amount of time.  Some of the preps are "split dose," and are designed to work best if you drink the prep in two sittings. Your personalized intructions above will explain that if full detail. In the event that you have tried everything suggested and still cannot complete the preparation, please call us at [phone].  If it is after  hours or on the weekend, the answering service will reach the doctor on call for you. ?Please follow prep instructions to ensure that your colon is properly cleansed. If your colon is inadequately pre

## 2022-02-21 NOTE — Progress Notes (Signed)
To Pacu, VSS. Report to [email or web address] 

## 2022-02-21 NOTE — Progress Notes (Signed)
? ?GASTROENTEROLOGY PROCEDURE H&P NOTE  ? ?Primary Care Physician: ?[place], Blondell Reveal, MD ? ? ? ?Reason for Procedure:   Dysphagia, colon cancer screening ? ?Plan:    EGD/colonoscopy ? ?Patient is appropriate for endoscopic procedure(s) in the ambulatory ([place]) setting. ? ?The nature of the procedure, as well as the risks, benefits, and alternatives were carefully and thoroughly reviewed with the patient. Ample time for discussion and questions allowed. The patient understood, was satisfied, and agreed to proceed.  ? ? ? ?HPI: ?Jacob Davila is a 46 y.o. male who presents for EGD/colonoscopy for evaluation of dysphagia and colon cancer screening .  Patient was most recently seen in the Gastroenterology Clinic on 01/06/22.  No interval change in medical history since that appointment. Please refer to that note for full details regarding GI history and clinical presentation.  ? ?Past Medical History:  ?Diagnosis Date  ? Scoliosis   ? - s/p correctvie Sgx  ? Venous insufficiency of both lower extremities 05/2021  ? Lower extremity venous Doppler 05/29/2021: No evidence of DVT.  Venous reflux noted in the right GSV in the calf.  Venous reflux noted in the left common femoral vein and left greater saphenous vein in the thigh.  Left GSV in the calf as well as a left perforator vein. => Forwarded to vascular surgery:  ? ? ?Past Surgical History:  ?Procedure Laterality Date  ? BACK SURGERY  03/1990  ? Scoliosis  ? TRANSTHORACIC ECHOCARDIOGRAM  06/08/2019  ? EF 60-65%.  Normal wall motion.  Normal relaxation.  Mild aortic valve thickening but no sclerosis/stenosis.  Normal echo  ? ? ?Prior to Admission medications   ?Medication Sig Start Date End Date Taking? Authorizing Provider  ?diphenhydrAMINE (BENADRYL) 25 mg capsule Take 25 mg by mouth daily as needed for allergies.    [provider]  ?furosemide (LASIX) 20 MG tablet Take 1 tablet (20 mg total) by mouth daily. And/Or as directed 12/31/21   Leonie Man, MD   ?ibuprofen (ADVIL) 200 MG tablet Take 400 mg by mouth every 6 (six) hours as needed for mild pain.    [provider]  ?metoprolol succinate (TOPROL XL) 25 MG 24 hr tablet Take 1 tablet (25 mg total) by mouth at bedtime. 12/31/21   Leonie Man, MD  ? ? ?Current Outpatient Medications  ?Medication Sig Dispense Refill  ? diphenhydrAMINE (BENADRYL) 25 mg capsule Take 25 mg by mouth daily as needed for allergies.    ? furosemide (LASIX) 20 MG tablet Take 1 tablet (20 mg total) by mouth daily. And/Or as directed 30 tablet 12  ? ibuprofen (ADVIL) 200 MG tablet Take 400 mg by mouth every 6 (six) hours as needed for mild pain.    ? metoprolol succinate (TOPROL XL) 25 MG 24 hr tablet Take 1 tablet (25 mg total) by mouth at bedtime. 90 tablet 3  ? ?No current facility-administered medications for this visit.  ? ? ?Allergies as of 02/21/2022 - Review Complete 01/06/2022  ?Allergen Reaction Noted  ? Darvon [propoxyphene]  05/27/2021  ? Tessalon [benzonatate]  05/27/2021  ? Latex Rash 04/19/2021  ? Morphine and related Rash 04/19/2021  ? ? ?Family History  ?Problem Relation Age of Onset  ? Atrial fibrillation Mother   ? Heart failure Mother   ? Hypertension Father   ? Hyperlipidemia Father   ? CAD Paternal Grandfather   ?     noted to have Angina (not aware of details)  ? Colon cancer  Neg Hx   ? Stomach cancer Neg Hx   ? Esophageal cancer Neg Hx   ? Pancreatic cancer Neg Hx   ? ? ?Social History  ? ?Socioeconomic History  ? Marital status: Single  ?  Spouse name: Not on file  ? Number of children: Not on file  ? Years of education: Not on file  ? Highest education level: Not on file  ?Occupational History  ? 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  Comment: [employer]  ?Tobacco Use  ? Smoking status: Some Days  ?  Types: Cigars  ? Smokeless tobacco: Never  ?Vaping Use  ? Vaping Use: Never used  ?Substance and Sexual Activity  ? Alcohol use: Yes  ?  Comment: Occ  ? Drug use: Never  ? Sexual  activity: Not on file  ?Other Topics Concern  ? Not on file  ?Social History Narrative  ? Reports he does smoke cigarettes, but denies alcohol use or use of other drugs.  He reports the only medication he takes is Benadryl, and that he takes approximately 2 tablets/day.  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  He has never had symptoms like this before.    ? ?Social Determinants of Health  ? ?Financial Resource Strain: Not on file  ?Food Insecurity: Not on file  ?Transportation Needs: Not on file  ?Physical Activity: Not on file  ?Stress: Not on file  ?Social Connections: Not on file  ?Intimate Partner Violence: Not on file  ? ? ?Physical Exam: ?Vital signs in last 24 hours: ?There were no vitals taken for this visit. ?GEN: NAD ?EYE: Sclerae anicteric ?ENT: MMM ?CV: Non-tachycardic ?Pulm: No increased WOB ?GI: Soft ?NEURO:  Alert & Oriented ? ? ?[name], MD ?[facility] ? ? ?02/21/2022 10:05 AM ? ?

## 2022-02-21 NOTE — Progress Notes (Signed)
Pt's states no medical or surgical changes since previsit or office visit. 

## 2022-02-21 NOTE — Patient Instructions (Addendum)
Handouts were given to your care partner on gastritis, polyps, diverticulosis, and hemorrhoids. ?Add OMEPRAZOLE (Prilosec) 40 mg x 2 per day for 8 weeks.  Best to take on an empty stomach 20-30 minutes before food. ?Per Dr. [name], send in [facility] suppositories #14 2 x per day for 7 days. ?NO ASPIRIN, ASPIRIN CONTAINING PRODUCTS (BC OR GOODY POWDERS) OR NSAIDS (IBUPROFEN, ADVIL, ALEVE, AND MOTRIN); TYLENOL IS OK TO TAKE . ?You may resume your other current medications today. ?Await biopsy results.  May take 1-3 weeks to receive pathology results. ?Repeat Upper Endoscopy for gastric ulcers/erosions for 8 weeks to check healing.  Pt has to look at work schedule and he will call us back to schedule EGD. ?Please call if any questions or concerns. ?  ? ? ? ?YOU HAD AN ENDOSCOPIC PROCEDURE TODAY AT [facility]:   Refer to the procedure report that was given to you for any specific questions about what was found during the examination.  If the procedure report does not answer your questions, please call your gastroenterologist to clarify.  If you requested that your care partner not be given the details of your procedure findings, then the procedure report has been included in a sealed envelope for you to review at your convenience later. ? ?YOU SHOULD EXPECT: Some feelings of bloating in the abdomen. Passage of more gas than usual.  Walking can help get rid of the air that was put into your GI tract during the procedure and reduce the bloating. If you had a lower endoscopy (such as a colonoscopy or flexible sigmoidoscopy) you may notice spotting of blood in your stool or on the toilet paper. If you underwent a bowel prep for your procedure, you may not have a normal bowel movement for a few days. ? ?Please Note:  You might notice some irritation and congestion in your nose or some drainage.  This is from the oxygen used during your procedure.  There is no need for concern and it should clear up in a day  or so. ? ?SYMPTOMS TO REPORT IMMEDIATELY: ? ?Following lower endoscopy (colonoscopy or flexible sigmoidoscopy): ? Excessive amounts of blood in the stool ? Significant tenderness or worsening of abdominal pains ? Swelling of the abdomen that is new, acute ? Fever of 100?F or higher ? ?Following upper endoscopy (EGD) ? Vomiting of blood or coffee ground material ? New chest pain or pain under the shoulder blades ? Painful or persistently difficult swallowing ? New shortness of breath ? Fever of 100?F or higher ? Black, tarry-looking stools ? ?For urgent or emergent issues, a gastroenterologist can be reached at any hour by calling [phone]. ?Do not use MyChart messaging for urgent concerns.  ? ? ?DIET:  We do recommend a small meal at first, but then you may proceed to your regular diet.  Drink plenty of fluids but you should avoid alcoholic beverages for 24 hours. ? ?ACTIVITY:  You should plan to take it easy for the rest of today and you should NOT DRIVE or use heavy machinery until tomorrow (because of the sedation medicines used during the test).   ? ?FOLLOW UP: ?Our staff will call the number listed on your records 48-72 hours following your procedure to check on you and address any questions or concerns that you may have regarding the information given to you following your procedure. If we do not reach you, we will leave a message.  We will attempt to reach  you two times.  During this call, we will ask if you have developed any symptoms of COVID 19. If you develop any symptoms (ie: fever, flu-like symptoms, shortness of breath, cough etc.) before then, please call [phone].  If you test positive for Covid 19 in the 2 weeks post procedure, please call and report this information to [place].   ? ?If any biopsies were taken you will be contacted by phone or by letter within the next 1-3 weeks.  Please call us at [phone] if you have not heard about the biopsies in 3 weeks.   ? ? ?SIGNATURES/CONFIDENTIALITY: ?You and/or your care partner have signed paperwork which will be entered into your electronic medical record.  These signatures attest to the fact that that the information above on your After Visit Summary has been reviewed and is understood.  Full responsibility of the confidentiality of this discharge information lies with you and/or your care-partner.  ?

## 2022-02-21 NOTE — Telephone Encounter (Signed)
PRe Visit instructions for EGD reviewed with patient. ?

## 2022-02-21 NOTE — Op Note (Signed)
[place] ?Patient Name: Jacob Davila ?Procedure Date: 02/21/2022 10:50 AM ?MRN: [id number] ?Endoscopist: Sonny Masters "[name] ,  ?Age: 46 ?Referring MD:  ?Date of Birth: 08/02/1976 ?Gender: Male ?Account #: [id number] ?Procedure:                Upper GI endoscopy ?Indications:              Dysphagia ?Medicines:                Monitored Anesthesia Care ?Procedure:                Pre-Anesthesia Assessment: ?                          - Prior to the procedure, a History and Physical  ?                          was performed, and patient medications and  ?                          allergies were reviewed. The patient's tolerance of  ?                          previous anesthesia was also reviewed. The risks  ?                          and benefits of the procedure and the sedation  ?                          options and risks were discussed with the patient.  ?                          All questions were answered, and informed consent  ?                          was obtained. Prior Anticoagulants: The patient has  ?                          taken no previous anticoagulant or antiplatelet  ?                          agents. ASA Grade Assessment: II - A patient with  ?                          mild systemic disease. After reviewing the risks  ?                          and benefits, the patient was deemed in  ?                          satisfactory condition to undergo the procedure. ?                          After obtaining informed consent, the endoscope was  ?  passed under direct vision. Throughout the  ?                          procedure, the patient's blood pressure, pulse, and  ?                          oxygen saturations were monitored continuously. The  ?                          GIF [id number] #[id number] was introduced through the  ?                          mouth, and advanced to the second part of duodenum.  ?                          The upper GI endoscopy was accomplished without   ?                          difficulty. The patient tolerated the procedure  ?                          well. ?Scope In: ?Scope Out: ?Findings:                 Few non-bleeding cratered gastric ulcers with  ?                          pigmented material were found in the gastric  ?                          antrum. The largest lesion was 3 mm in largest  ?                          dimension. Biopsies were taken with a cold forceps  ?                          for Helicobacter pylori testing. ?                          Many non-bleeding erosions were found in the middle  ?                          third of the esophagus and in the lower third of  ?                          the esophagus. ?                          The examined duodenum was normal. Biopsies were  ?                          taken with a cold forceps for histology. ?Complications:            No immediate complications. ?Estimated Blood Loss:     Estimated blood loss was minimal. ?Impression:               -  Non-bleeding gastric ulcers with pigmented  ?                          material. Biopsied. ?                          - Many non-bleeding erosions in the middle third of  ?                          the esophagus and in the lower third of the  ?                          esophagus. ?                          - Normal examined duodenum. Biopsied. ?Recommendation:           - Await pathology results. ?                          - Use Prilosec (omeprazole) 40 mg PO BID for 8  ?                          weeks. ?                          - Repeat upper endoscopy in 8 weeks to check  ?                          healing. ?                          - No aspirin, ibuprofen, naproxen, or other  ?                          non-steroidal anti-inflammatory drugs. ?                          - Perform a colonoscopy today. ?Georgian Co,  ?02/21/2022 11:49:19 AM ?

## 2022-02-21 NOTE — Progress Notes (Signed)
Called to room to assist during endoscopic procedure.  Patient ID and intended procedure confirmed with present staff. Received instructions for my participation in the procedure from the performing physician.  

## 2022-02-23 ENCOUNTER — Telehealth: Payer: Self-pay | Admitting: *Deleted

## 2022-02-23 ENCOUNTER — Encounter: Payer: Self-pay | Admitting: Internal Medicine

## 2022-02-23 NOTE — Telephone Encounter (Signed)
First follow up.  No answer, LVM ?

## 2022-02-23 NOTE — Telephone Encounter (Signed)
?  Follow up Call- ? ? ?  02/21/2022  ? 10:25 AM  ?Call back number  ?Post procedure Call Back phone  # [phone]  ?Permission to leave phone message Yes  ?  ?No answer at 2nd attempt follow up phone call.  Left message on voicemail.   ?

## 2022-02-24 ENCOUNTER — Other Ambulatory Visit: Payer: Self-pay

## 2022-02-24 ENCOUNTER — Encounter: Payer: Self-pay | Admitting: Vascular Surgery

## 2022-02-24 ENCOUNTER — Encounter: Payer: Self-pay | Admitting: Cardiology

## 2022-02-24 ENCOUNTER — Encounter (HOSPITAL_BASED_OUTPATIENT_CLINIC_OR_DEPARTMENT_OTHER): Payer: Self-pay | Admitting: Family Medicine

## 2022-02-24 DIAGNOSIS — K259 Gastric ulcer, unspecified as acute or chronic, without hemorrhage or perforation: Secondary | ICD-10-CM

## 2022-02-24 DIAGNOSIS — R7989 Other specified abnormal findings of blood chemistry: Secondary | ICD-10-CM

## 2022-02-24 MED ORDER — OMEPRAZOLE 40 MG PO CPDR: 40.0000 mg | DELAYED_RELEASE_CAPSULE | Freq: Two times a day (BID) | ORAL | 0 refills | Status: DC

## 2022-03-01 NOTE — Progress Notes (Deleted)
?Cardiology Office Note:   ? ?Date:  03/01/2022  ? ?ID:  Jacob Davila, DOB 1975-11-27, MRN [id number] ? ?PCP:  de [place], Raymond J, MD  ?Cardiologist:  Glenetta Hew, MD  ?Electrophysiologist:  None  ? ?Referring MD: de [place], Blondell Reveal, MD  ? ?Chief Complaint: lower extremity edema ? ?History of Present Illness:   ? ?Jacob Davila is a 46 y.o. male with a history of paroxysmal tachycardia, varicose veins with bilateral lower extremity edema, and scoliosis who is followed by Dr. Ellyn Hack and presents today for evaluation of lower extremity edema.  ? ?Patient was referred to Dr. Ellyn Hack in 04/2021 after recent ED visit for lower extremity edema, palpitations, and shortness of breath. Patient reported lower extremity with associated pain and tingling all over. He stated he felt like his legs were going to pop. 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 It was felt that he likely had venous stasis. Lower extremity venous dopplers were negative for DVT but confirmed venous reflux in the right great saphenous vein in the calf as well as venous reflux in the left common femoral vein, greater saphenous vein in the calf/thigh, and perforator vein. Compression stockings and elevating legs as much as possible was recommended and referral was placed to Vein and Vascular Surgery. He also reported a couple of episodes of tachycardia with associated dizziness. Echo and Event Monitor were also ordered. Echo showed LVEF of 60-65% with normal wall motion and normal diastolic parameters but no significant valvular disease. Monitor showed underlying sinus rhythm and rare PACs/PVCs but no significant arrhythmias. ? ?Patient was last seen by Dr. Ellyn Hack in 12/2021 at which time he continued to have intermittent episodes of tachycardia with associated lightheadedness and near syncope. [facility] [employer] tracings showed sinus rhythm with occasional ectopy. Recorded heart rate were as high as the low 120s. His edema was  relatively well controlled with compression stockings. However, he was also complaining of significant back and leg pain. He was started on low dose Toprol-XL '25mg'$  daily and low dose Lasix '20mg'$  daily. He was advised to continue to follow with Dr. Scot Dock for his lower extremity venous reflux.  ? ?Patient sent [place] a MyChart message on 02/24/2022 with complains of worsening lower back pain and lower extremity edema. Therefore, this visit was arranged for further evaluation. *** ? ?Varicose Veins with Bilateral Lower Extremity Edema ?Chronic issue. Venous dopplers in 05/2021 negative for DVT but confirmed venous reflux in the right great saphenous vein in the calf as well as venous reflux in the left common femoral vein, greater saphenous vein in the calf/thigh, and perforator vein. ?- *** ?- Continue Lasix '20mg'$  daily. ?- Continue compressions stockings and elevating legs as much as possible. ?- Follow-up with Dr. Scot Dock. ? ?Paroxysmal Sinus Tachycardia ?Monitor in 06/2021 showed rare ectopy with no significant arrhythmias. No atrial fibrillation. ?- *** ?- Continue Toprol-XL '25mg'$  daily. ? ?Low Back Pain ?*** ? ?Past Medical History:  ?Diagnosis Date  ? Scoliosis   ? - s/p correctvie Sgx  ? Venous insufficiency of both lower extremities 05/2021  ? Lower extremity venous Doppler 05/29/2021: No evidence of DVT.  Venous reflux noted in the right GSV in the calf.  Venous reflux noted in the left common femoral vein and left greater saphenous vein in the thigh.  Left GSV in the calf as well as a left perforator vein. => Forwarded to vascular surgery:  ? ? ?Past Surgical History:  ?  Procedure Laterality Date  ? BACK SURGERY  03/1990  ? Scoliosis  ? TRANSTHORACIC ECHOCARDIOGRAM  06/08/2019  ? EF 60-65%.  Normal wall motion.  Normal relaxation.  Mild aortic valve thickening but no sclerosis/stenosis.  Normal echo  ? ? ?Current Medications: ?No outpatient medications have been marked as taking for the 03/03/22 encounter (Appointment)  with Darreld Mclean, PA-C.  ?  ? ?Allergies:   Darvon [propoxyphene], Tessalon [benzonatate], Latex, and Morphine and related  ? ?Social History  ? ?Socioeconomic History  ? Marital status: Single  ?  Spouse name: Not on file  ? Number of children: Not on file  ? Years of education: Not on file  ? Highest education level: Not on file  ?Occupational History  ? 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  Comment: [employer]  ?Tobacco Use  ? Smoking status: Some Days  ?  Types: Cigars  ? Smokeless tobacco: Never  ?Vaping Use  ? Vaping Use: Never used  ?Substance and Sexual Activity  ? Alcohol use: Yes  ?  Comment: Occ  ? Drug use: Never  ? Sexual activity: Not on file  ?Other Topics Concern  ? Not on file  ?Social History Narrative  ? Reports he does smoke cigarettes, but denies alcohol use or use of other drugs.  He reports the only medication he takes is Benadryl, and that he takes approximately 2 tablets/day.  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  He has never had symptoms like this before.    ? ?Social Determinants of Health  ? ?Financial Resource Strain: Not on file  ?Food Insecurity: Not on file  ?Transportation Needs: Not on file  ?Physical Activity: Not on file  ?Stress: Not on file  ?Social Connections: Not on file  ?  ? ?Family History: ?The patient's family history includes Atrial fibrillation in his mother; CAD in his paternal grandfather; Heart failure in his mother; Hyperlipidemia in his father; Hypertension in his father. There is no history of Colon cancer, Stomach cancer, Esophageal cancer, or Pancreatic cancer. ? ?ROS:   ?Please see the history of present illness.    ? ?EKGs/Labs/Other Studies Reviewed:   ? ?The following studies were reviewed today: ? ?Lower Extremity Venous Ultrasound 05/28/2021: ?Summary:  ?Bilateral:   ?- No evidence of deep vein thrombosis seen in the lower extremities,  ?bilaterally, from the common femoral through the popliteal veins.  ?   ?Right:  ?- Venous reflux is noted in the right greater saphenous vein in the calf.  ?   ?Left:  ?- Venous reflux is noted in the left common femoral vein.  ?- Venous reflux is noted in the left greater saphenous vein in the thigh.  ?- Venous reflux is noted in the left greater saphenous vein in the calf.  ?- Venous reflux is noted in the left perforator vein.  ?_______________ ? ?Echocardiogram 06/07/2021: ?Impressions: ?1. Left ventricular ejection fraction, by estimation, is 60 to 65%. Left  ?ventricular ejection fraction by 3D volume is 56 %. The left ventricle has  ?normal function. The left ventricle has no regional wall motion  ?abnormalities. Left ventricular diastolic  ? parameters were normal. The average left ventricular global longitudinal  ?strain is -25.3 %. The global longitudinal strain is normal.  ? 2. Right ventricular systolic function is normal.  The right ventricular  ?size is normal.  ? 3. The mitral valve is normal in structure. No evidence of mitral valve  ?regurgitation. No evidence of mitral stenosis.  ? 4. The aortic valve is tricuspid. There is mild thickening of the aortic  ?valve. Aortic valve regurgitation is not visualized. No aortic stenosis is  ?present.  ? 5. The inferior vena cava is normal in size with greater than 50%  ?respiratory variability, suggesting right atrial pressure of 3 mmHg.  ?_______________ ? ?Event Monitor 05/28/2022 to 06/26/2022: ?Monitor worn from 7/8-8/6 2022 ?Baseline rhythm was sinus rhythm: Minimum heart rate 63 bpm, max heart rate 158 bpm, average 94 bpm. ?Rare(<1%) PACs and PVCs noted. ?Sinus tachycardia noted but no arrhythmias noted. ?No tachyarrhythmias or bradycardia arrhythmias noted. (No atrial fibrillation, atrial flutter, supraventricular tachycardia, paroxysmal atrial tachycardia or ventricular  tachycardia). ?No bradycardia noted. ?No patient triggered events. ?  ?Pretty normal study.  Shows normal sinus rhythm that can go fast.  No slow rhythms.  No abnormal rhythms. ? ?EKG:  EKG not ordered today.  ? ?Rece

## 2022-03-03 ENCOUNTER — Ambulatory Visit (HOSPITAL_BASED_OUTPATIENT_CLINIC_OR_DEPARTMENT_OTHER): Payer: Managed Care, Other (non HMO) | Admitting: Family Medicine

## 2022-03-03 ENCOUNTER — Ambulatory Visit: Payer: Managed Care, Other (non HMO) | Admitting: Student

## 2022-03-03 ENCOUNTER — Encounter (HOSPITAL_BASED_OUTPATIENT_CLINIC_OR_DEPARTMENT_OTHER): Payer: Self-pay | Admitting: Family Medicine

## 2022-03-03 VITALS — BP 76/47 | HR 90 | Temp 97.7°F | Ht 71.0 in | Wt 151.6 lb

## 2022-03-03 DIAGNOSIS — I83893 Varicose veins of bilateral lower extremities with other complications: Secondary | ICD-10-CM | POA: Diagnosis not present

## 2022-03-03 NOTE — Patient Instructions (Signed)
FOLLOW UP WITH VASCULAR SURGERY DR. DICKSON ?Address: [address], [place], [place] [address] ?Phone: [phone] ? ? ?

## 2022-03-03 NOTE — Progress Notes (Signed)
? ? ?  Procedures performed today:   ? ?None. ? ?Independent interpretation of notes and tests performed by another provider:  ? ?None. ? ?Brief History, Exam, Impression, and Recommendations:   ? ?BP (!) 76/47   Pulse 90   Temp 97.7 ?F (36.5 ?C)   Ht '5\' 11"'$  (1.803 m)   Wt 151 lb 9.6 oz (68.8 kg)   SpO2 98%   BMI 21.14 kg/m?  ? ?Varicose veins of leg with swelling, bilateral ?Patient continues to have some lower extremity swelling, notable when standing or walking for prolonged periods of time.  He does feel that symptoms have been worsening over the past months.  He has had evaluation with vascular surgeon, cardiologist, neurologist.  Has had prior MRI, EMG, reflux studies completed.  He has not had follow-up with neurology since the EMG was completed.  Review of chart indicates that EMG was normal. ?Patient is concerned due to the impact of his symptoms on his ability to perform his work duties.  His basic duties do involve a loss of standing and walking.  He is requesting to have a form completed to provide accommodations while at work, particularly to allow for more sitting and less standing and walking. ?Given his continued symptoms, I do recommend following up sooner with vascular surgeon as they may want to repeat studies sooner and consider other interventions ?Pain seems to be less likely related to neurologic cause given EMG results ?I did complete paperwork for patient today to attempt for his work to provide accommodations to help control symptoms.  I did advise that further recommendations may be more beneficial from vascular surgery regarding his specific issue as they can likely be more specific and detailed and recommendations ?Ultimately I do think that he needs sooner follow-up with vascular surgery as likely most of his symptoms are coming from his ongoing venous reflux issues which are likely also contributing to his lower blood pressure as well as sinus tachycardia ? ?Spent 35 minutes on  this patient encounter, including preparation, chart review, face-to-face counseling with patient and coordination of care, and documentation of encounter ? ?Plan for follow-up in about 2-3 months or sooner as needed ? ? ?___________________________________________ ?Annaliyah Willig de [place], MD, ABFM, CAQSM ?Primary Care and Sports Medicine ?[facility] ?

## 2022-03-03 NOTE — Assessment & Plan Note (Addendum)
Patient continues to have some lower extremity swelling, notable when standing or walking for prolonged periods of time.  He does feel that symptoms have been worsening over the past months.  He has had evaluation with vascular surgeon, cardiologist, neurologist.  Has had prior MRI, EMG, reflux studies completed.  He has not had follow-up with neurology since the EMG was completed.  Review of chart indicates that EMG was normal. ?Patient is concerned due to the impact of his symptoms on his ability to perform his work duties.  His basic duties do involve a loss of standing and walking.  He is requesting to have a form completed to provide accommodations while at work, particularly to allow for more sitting and less standing and walking. ?Given his continued symptoms, I do recommend following up sooner with vascular surgeon as they may want to repeat studies sooner and consider other interventions ?Pain seems to be less likely related to neurologic cause given EMG results ?I did complete paperwork for patient today to attempt for his work to provide accommodations to help control symptoms.  I did advise that further recommendations may be more beneficial from vascular surgery regarding his specific issue as they can likely be more specific and detailed and recommendations ?Ultimately I do think that he needs sooner follow-up with vascular surgery as likely most of his symptoms are coming from his ongoing venous reflux issues which are likely also contributing to his lower blood pressure as well as sinus tachycardia ?

## 2022-03-08 ENCOUNTER — Other Ambulatory Visit: Payer: Self-pay | Admitting: Radiology

## 2022-03-09 ENCOUNTER — Encounter (HOSPITAL_COMMUNITY): Payer: Self-pay

## 2022-03-09 ENCOUNTER — Ambulatory Visit (HOSPITAL_COMMUNITY)
Admission: RE | Admit: 2022-03-09 | Discharge: 2022-03-09 | Disposition: A | Discharge status: Home / Self Care | Payer: Managed Care, Other (non HMO) | Source: Ambulatory Visit | Attending: Internal Medicine | Admitting: Internal Medicine

## 2022-03-09 ENCOUNTER — Other Ambulatory Visit: Payer: Self-pay

## 2022-03-09 DIAGNOSIS — Z579 Occupational exposure to unspecified risk factor: Secondary | ICD-10-CM | POA: Insufficient documentation

## 2022-03-09 DIAGNOSIS — K7581 Nonalcoholic steatohepatitis (NASH): Secondary | ICD-10-CM | POA: Diagnosis not present

## 2022-03-09 DIAGNOSIS — Z77098 Contact with and (suspected) exposure to other hazardous, chiefly nonmedicinal, chemicals: Secondary | ICD-10-CM | POA: Diagnosis not present

## 2022-03-09 DIAGNOSIS — F17210 Nicotine dependence, cigarettes, uncomplicated: Secondary | ICD-10-CM | POA: Diagnosis not present

## 2022-03-09 DIAGNOSIS — Z79899 Other long term (current) drug therapy: Secondary | ICD-10-CM | POA: Diagnosis not present

## 2022-03-09 DIAGNOSIS — R7989 Other specified abnormal findings of blood chemistry: Secondary | ICD-10-CM | POA: Diagnosis present

## 2022-03-09 LAB — CBC
HCT: 33.4 % — ABNORMAL LOW (ref 39.0–52.0)
Hemoglobin: 10.8 g/dL — ABNORMAL LOW (ref 13.0–17.0)
MCH: 35.3 pg — ABNORMAL HIGH (ref 26.0–34.0)
MCHC: 32.3 g/dL (ref 30.0–36.0)
MCV: 109.2 fL — ABNORMAL HIGH (ref 80.0–100.0)
Platelets: 267 10*3/uL (ref 150–400)
RBC: 3.06 MIL/uL — ABNORMAL LOW (ref 4.22–5.81)
RDW: 14 % (ref 11.5–15.5)
WBC: 3.9 10*3/uL — ABNORMAL LOW (ref 4.0–10.5)
nRBC: 0 % (ref 0.0–0.2)

## 2022-03-09 LAB — PROTIME-INR
INR: 1.2 (ref 0.8–1.2)
Prothrombin Time: 14.9 seconds (ref 11.4–15.2)

## 2022-03-09 MED ORDER — FENTANYL CITRATE (PF) 100 MCG/2ML IJ SOLN
INTRAMUSCULAR | Status: AC
  Filled 2022-03-09: qty 4

## 2022-03-09 MED ORDER — DIPHENHYDRAMINE HCL 25 MG PO CAPS
ORAL_CAPSULE | ORAL | Status: AC
  Filled 2022-03-09: qty 1

## 2022-03-09 MED ORDER — DIPHENHYDRAMINE HCL 25 MG PO CAPS
25.0000 mg | ORAL_CAPSULE | Freq: Once | ORAL | Status: DC
  Filled 2022-03-09: qty 1

## 2022-03-09 MED ORDER — SODIUM CHLORIDE 0.9 % IV SOLN: INTRAVENOUS | Status: DC

## 2022-03-09 MED ORDER — MIDAZOLAM HCL 2 MG/2ML IJ SOLN
INTRAMUSCULAR | Status: DC
Start: 2022-03-09 — End: 2022-03-10
  Filled 2022-03-09: qty 4

## 2022-03-09 MED ORDER — HYDROCODONE-ACETAMINOPHEN 5-325 MG PO TABS
1.0000 | ORAL_TABLET | ORAL | Status: DC | PRN
  Filled 2022-03-09: qty 2

## 2022-03-09 MED ORDER — LIDOCAINE HCL (PF) 1 % IJ SOLN
INTRAMUSCULAR | Status: AC
  Filled 2022-03-09: qty 30

## 2022-03-09 MED ORDER — FENTANYL CITRATE (PF) 100 MCG/2ML IJ SOLN
INTRAMUSCULAR | Status: AC | PRN
  Administered 2022-03-09: 25 ug via INTRAVENOUS
  Administered 2022-03-09: 50 ug via INTRAVENOUS

## 2022-03-09 MED ORDER — MIDAZOLAM HCL 2 MG/2ML IJ SOLN
INTRAMUSCULAR | Status: AC | PRN
  Administered 2022-03-09: .5 mg via INTRAVENOUS
  Administered 2022-03-09: 1 mg via INTRAVENOUS

## 2022-03-09 MED ORDER — GELATIN ABSORBABLE 12-7 MM EX MISC
CUTANEOUS | Status: AC
  Filled 2022-03-09: qty 1

## 2022-03-09 NOTE — Progress Notes (Signed)
Patient stated that he is allergic to the current pain medications ordered for him. Vernard Gambles, MD paged. Vernard Gambles, MD gave verbal order for Benadryl 25 mg PO. Orders placed.  ?

## 2022-03-09 NOTE — Procedures (Signed)
?  Procedure:  [place] core liver biopsy   ?Preprocedure diagnosis: The encounter diagnosis was Elevated liver function tests. ? ?Postprocedure diagnosis: same ?EBL:    minimal ?Complications:   none immediate ? ?See full dictation in [facility]. ? ?D. Arne Cleveland MD ?Main # [phone] ?Pager  [phone] ?Mobile [phone] ?  ? ?

## 2022-03-09 NOTE — Progress Notes (Signed)
Pt now states he does not want hydrocodone and benadryl-states "I haven't had any in so long I don't know what it will do to me"-both meds wasted with Aneta Mins, RN ?

## 2022-03-09 NOTE — H&P (Signed)
? ? ? ?Chief Complaint: ?Elevated LFTs ? ?Referring Physician(s): ?Dayna Barker ? ?Supervising Physician: Arne Cleveland ? ?Patient Status: [facility] - Out-pt ? ?History of Present Illness: ?Jacob Davila is a 46 y.o. male with elevated LFTs. ? ?AST 95 and ALT 73 on labs done in February. ? ?US done 01/14/22 showed=  ?Increased liver parenchymal echogenicity which is nonspecific but ?most commonly seen with hepatic steatosis. ? ?He is here today for random liver biopsy. ? ?He is NPO. No nausea/vomiting. No Fever/chills. ROS negative. ? ? ?Past Medical History:  ?Diagnosis Date  ? Scoliosis   ? - s/p correctvie Sgx  ? Venous insufficiency of both lower extremities 05/2021  ? Lower extremity venous Doppler 05/29/2021: No evidence of DVT.  Venous reflux noted in the right GSV in the calf.  Venous reflux noted in the left common femoral vein and left greater saphenous vein in the thigh.  Left GSV in the calf as well as a left perforator vein. => Forwarded to vascular surgery:  ? ? ?Past Surgical History:  ?Procedure Laterality Date  ? BACK SURGERY  03/1990  ? Scoliosis  ? TRANSTHORACIC ECHOCARDIOGRAM  06/08/2019  ? EF 60-65%.  Normal wall motion.  Normal relaxation.  Mild aortic valve thickening but no sclerosis/stenosis.  Normal echo  ? ? ?Allergies: ?Darvon [propoxyphene], Tessalon [benzonatate], Latex, and Morphine and related ? ?Medications: ?Prior to Admission medications   ?Medication Sig Start Date End Date Taking? Authorizing Provider  ?acetaminophen (TYLENOL) 500 MG tablet Take 500-1,000 mg by mouth every 6 (six) hours as needed for moderate pain.   Yes [provider]  ?diphenhydrAMINE (BENADRYL) 25 mg capsule Take 25 mg by mouth daily as needed for allergies.   Yes [provider]  ?hydrocortisone (ANUSOL-HC) 25 MG suppository Place 1 suppository (25 mg total) rectally 2 (two) times daily. ?Patient taking differently: Place 25 mg rectally at bedtime. 02/21/22  Yes Sharyn Creamer, MD  ?metoprolol  succinate (TOPROL XL) 25 MG 24 hr tablet Take 1 tablet (25 mg total) by mouth at bedtime. 12/31/21  Yes Leonie Man, MD  ?omeprazole (PRILOSEC) 40 MG capsule Take 1 capsule (40 mg total) by mouth in the morning and at bedtime. 02/24/22 04/21/22 Yes Sharyn Creamer, MD  ?furosemide (LASIX) 20 MG tablet Take 1 tablet (20 mg total) by mouth daily. And/Or as directed ?Patient not taking: Reported on 03/07/2022 12/31/21   Leonie Man, MD  ?  ? ?Family History  ?Problem Relation Age of Onset  ? Atrial fibrillation Mother   ? Heart failure Mother   ? Hypertension Father   ? Hyperlipidemia Father   ? CAD Paternal Grandfather   ?     noted to have Angina (not aware of details)  ? Colon cancer Neg Hx   ? Stomach cancer Neg Hx   ? Esophageal cancer Neg Hx   ? Pancreatic cancer Neg Hx   ? ? ?Social History  ? ?Socioeconomic History  ? Marital status: Single  ?  Spouse name: Not on file  ? Number of children: Not on file  ? Years of education: Not on file  ? Highest education level: Not on file  ?Occupational History  ? 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  Comment: [employer]  ?Tobacco Use  ? Smoking status: Some Days  ?  Types: Cigars  ? Smokeless tobacco: Never  ?Vaping Use  ? Vaping Use: Never used  ?Substance and Sexual Activity  ?  Alcohol use: Yes  ?  Comment: Occ  ? Drug use: Never  ? Sexual activity: Not on file  ?Other Topics Concern  ? Not on file  ?Social History Narrative  ? Reports he does smoke cigarettes, but denies alcohol use or use of other drugs.  He reports the only medication he takes is Benadryl, and that he takes approximately 2 tablets/day.  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  He has never had symptoms like this before.    ? ?Social Determinants of Health  ? ?Financial Resource Strain: Not on file  ?Food Insecurity:  Not on file  ?Transportation Needs: Not on file  ?Physical Activity: Not on file  ?Stress: Not on file  ?Social Connections: Not on file  ? ? ? ?Review of Systems: A 12 point ROS discussed and pertinent positives are indicated in the HPI above.  All other systems are negative. ? ?Review of Systems ? ?Vital Signs: ?BP 110/71 (BP Location: Left Arm)   Pulse 75   Temp 98.1 ?F (36.7 ?C) (Oral)   Ht '5\' 11"'$  (1.803 m)   Wt 150 lb (68 kg)   SpO2 99%   BMI 20.92 kg/m?  ? ?Physical Exam ?Vitals reviewed.  ?Constitutional:   ?   Appearance: Normal appearance.  ?HENT:  ?   Head: Normocephalic and atraumatic.  ?Eyes:  ?   Extraocular Movements: Extraocular movements intact.  ?Cardiovascular:  ?   Rate and Rhythm: Normal rate and regular rhythm.  ?Pulmonary:  ?   Effort: Pulmonary effort is normal. No respiratory distress.  ?   Breath sounds: Normal breath sounds.  ?Abdominal:  ?   General: There is no distension.  ?   Palpations: Abdomen is soft.  ?   Tenderness: There is no abdominal tenderness.  ?Musculoskeletal:     ?   General: Normal range of motion.  ?   Cervical back: Normal range of motion.  ?Skin: ?   General: Skin is warm and dry.  ?Neurological:  ?   General: No focal deficit present.  ?   Mental Status: He is alert and oriented to person, place, and time.  ?Psychiatric:     ?   Mood and Affect: Mood normal.     ?   Behavior: Behavior normal.     ?   Thought Content: Thought content normal.     ?   Judgment: Judgment normal.  ? ? ?Imaging: ?No results found. ? ?Labs: ? ?CBC: ?Recent Labs  ?  04/19/21 ?1020 04/19/21 ?1037 09/15/21 ?1637 01/06/22 ?1243  ?WBC 6.1  --  4.2 4.8  ?HGB 12.5* 15.0 11.9* 13.5  ?HCT 37.5* 44.0 35.1* 39.2  ?PLT 183  --  212 260.0  ? ? ?COAGS: ?Recent Labs  ?  01/06/22 ?1243  ?INR 1.0  ? ? ?BMP: ?Recent Labs  ?  04/19/21 ?1020 04/19/21 ?1037 09/15/21 ?1637  ?NA 136 138 139  ?K 3.2* 3.2* 4.6  ?CL 102  --  102  ?CO2 26  --  22  ?GLUCOSE 102*  --  101*  ?BUN 11  --  12  ?CALCIUM 8.3*  --  8.7   ?CREATININE 1.23  --  1.19  ?GFRNONAA >60  --   --   ? ? ?LIVER FUNCTION TESTS: ?Recent Labs  ?  04/19/21 ?1020 09/15/21 ?  1637 01/06/22 ?1243  ?BILITOT 2.0* 0.4 0.8  ?AST 107* 150* 95*  ?ALT 94* 105* 73*  ?ALKPHOS 143* 137* 104  ?PROT 6.3* 6.1 7.6  ?ALBUMIN 3.5 3.8* 4.6  ? ? ?TUMOR MARKERS: ?No results for input(s): AFPTM, CEA, CA199, CHROMGRNA in the last 8760 hours. ? ?Assessment and Plan: ? ?Elevated liver enzymes ? ?Will proceed with image guided random liver biopsy today by Dr. Vernard Gambles. ? ?Risks and benefits of random liver biopsy was discussed with the patient and/or patient's family including, but not limited to bleeding, infection, damage to adjacent structures or low yield requiring additional tests. ? ?All of the questions were answered and there is agreement to proceed. ? ?Consent signed and in chart. ? ?Thank you for allowing our service to participate in Jacob Davila 's care. ? ?Electronically Signed: ?Murrell Redden, PA-C   ?03/09/2022, 12:11 PM ? ? ? ? ? ?I spent a total of    15 Minutes in face to face in clinical consultation, greater than 50% of which was counseling/coordinating care for random liver biopsy. ? ?

## 2022-03-11 LAB — SURGICAL PATHOLOGY

## 2022-03-16 ENCOUNTER — Telehealth: Payer: Self-pay | Admitting: *Deleted

## 2022-03-16 ENCOUNTER — Telehealth: Payer: Self-pay | Admitting: Internal Medicine

## 2022-03-16 NOTE — Telephone Encounter (Signed)
Patient called asking for someone to call him and elaborate on the test results he recently received.  He just wants a more detailed explanation as well as what his next steps should be.  Please call patient and advise.  Thank you. ?

## 2022-03-16 NOTE — Telephone Encounter (Signed)
Patient called stating pain and swelling of lower extremities, left  has worsened, to the point of interfering with his job.  An appointment was scheduled for reflux study and office visit 03/24/2022. ?

## 2022-03-17 ENCOUNTER — Telehealth: Payer: Self-pay | Admitting: Internal Medicine

## 2022-03-17 NOTE — Telephone Encounter (Signed)
Called the patient to let him know about the results of his liver biopsy. The biopsy results showed minimally active steatohepatitis and stage 2/3 liver fibrosis. 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 There is a possibility of DILI/toxin-induced liver disease as well. Patient states that he drinks 1-2 alcoholic beverages every few days. I recommended that he discontinue drinking all alcohol. He is trying to follow a healthier diet. I recommended that he try daily coffee consumption to reduce his risk of liver disease progression. 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 I did find one case report of inhaled isopropyl alcohol leading to liver injury, but it not clear at this time whether or not this would be a definitive cause of his liver injury. We can plan to recheck his liver tests in a few months. I will see him in one month for his follow up EGD. ?

## 2022-03-17 NOTE — Telephone Encounter (Signed)
Returned pt call to inquire further about his question/concern. States he wants an explanation of the liver biopsy results. Advised results have not yet been reviewed by his provider. Once his provider has reviewed ALL his results, he will receive a call or a My Chart message about his results along with any recommendations. Verbalized acceptance and understanding. ?

## 2022-03-24 ENCOUNTER — Ambulatory Visit (INDEPENDENT_AMBULATORY_CARE_PROVIDER_SITE_OTHER): Payer: Managed Care, Other (non HMO) | Admitting: Physician Assistant

## 2022-03-24 ENCOUNTER — Ambulatory Visit (HOSPITAL_COMMUNITY)
Admission: RE | Admit: 2022-03-24 | Discharge: 2022-03-24 | Disposition: A | Discharge status: Home / Self Care | Payer: Managed Care, Other (non HMO) | Source: Ambulatory Visit | Attending: Physician Assistant | Admitting: Physician Assistant

## 2022-03-24 VITALS — BP 98/62 | HR 68 | Temp 97.7°F | Resp 18 | Ht 71.0 in | Wt 154.3 lb

## 2022-03-24 DIAGNOSIS — I83893 Varicose veins of bilateral lower extremities with other complications: Secondary | ICD-10-CM

## 2022-03-24 DIAGNOSIS — I872 Venous insufficiency (chronic) (peripheral): Secondary | ICD-10-CM | POA: Diagnosis not present

## 2022-03-24 NOTE — Progress Notes (Signed)
?Office Note  ? ? ? ?CC:  follow up ?Requesting Provider:  [place], Blondell Reveal, MD ? ?HPI: Jacob Davila is a 46 y.o. (09-22-1976) male who presents for reevaluation of left lower extremity edema and pain.  He was last seen by Dr. Scot Dock in November 2022 for the same reason.  He continues to have edema of the left more than the right leg.  He also describes a dull pain in his left leg that progresses over the course of the day.  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  He has been seen by neurology and has been worked up with nerve conduction studies as well as worked up for lumbar spine etiology.  He continues to wear thigh-high compression daily.  He continues to elevate his legs above the level of his heart when possible throughout the day.  He tries to remain active however the symptoms of his left leg are worsening.  He has varicosities of his left lower leg however these are not painful to touch.  He denies any trauma, surgery, prior vascular interventions of the left leg.  He denies tobacco use. ? ? ? ?Past Medical History:  ?Diagnosis Date  ? Scoliosis   ? - s/p correctvie Sgx  ? Venous insufficiency of both lower extremities 05/2021  ? Lower extremity venous Doppler 05/29/2021: No evidence of DVT.  Venous reflux noted in the right GSV in the calf.  Venous reflux noted in the left common femoral vein and left greater saphenous vein in the thigh.  Left GSV in the calf as well as a left perforator vein. => Forwarded to vascular surgery:  ? ? ?Past Surgical History:  ?Procedure Laterality Date  ? BACK SURGERY  03/1990  ? Scoliosis  ? TRANSTHORACIC ECHOCARDIOGRAM  06/08/2019  ? EF 60-65%.  Normal wall motion.  Normal relaxation.  Mild aortic valve thickening but no sclerosis/stenosis.  Normal echo  ? ? ?Social History  ? ?Socioeconomic History  ? Marital status: Single  ?  Spouse name: Not on file  ? Number of children: Not on file  ? Years of education: Not on file  ? Highest education level:  Not on file  ?Occupational History  ? 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  Comment: [place]  ?Tobacco Use  ? Smoking status: Some Days  ?  Types: Cigars  ? Smokeless tobacco: Never  ?Vaping Use  ? Vaping Use: Never used  ?Substance and Sexual Activity  ? Alcohol use: Yes  ?  Comment: Occ  ? Drug use: Never  ? Sexual activity: Not on file  ?Other Topics Concern  ? Not on file  ?Social History Narrative  ? Reports he does smoke cigarettes, but denies alcohol use or use of other drugs.  He reports the only medication he takes is Benadryl, and that he takes approximately 2 tablets/day.  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  He has never had symptoms like this before.    ? ?Social Determinants of Health  ? ?Financial Resource Strain: Not on file  ?Food Insecurity: Not on file  ?Transportation Needs: Not on file  ?Physical Activity: Not on file  ?Stress: Not on file  ?Social Connections: Not on file  ?Intimate Partner Violence: Not on file  ? ? ?  Family History  ?Problem Relation Age of Onset  ? Atrial fibrillation Mother   ? Heart failure Mother   ? Hypertension Father   ? Hyperlipidemia Father   ? CAD Paternal Grandfather   ?     noted to have Angina (not aware of details)  ? Colon cancer Neg Hx   ? Stomach cancer Neg Hx   ? Esophageal cancer Neg Hx   ? Pancreatic cancer Neg Hx   ? ? ?Current Outpatient Medications  ?Medication Sig Dispense Refill  ? acetaminophen (TYLENOL) 500 MG tablet Take 500-1,000 mg by mouth every 6 (six) hours as needed for moderate pain.    ? furosemide (LASIX) 20 MG tablet Take 1 tablet (20 mg total) by mouth daily. And/Or as directed 30 tablet 12  ? hydrocortisone (ANUSOL-HC) 25 MG suppository Place 1 suppository (25 mg total) rectally 2 (two) times daily. (Patient taking differently: Place 25 mg rectally  at bedtime.) 14 suppository 0  ? metoprolol succinate (TOPROL XL) 25 MG 24 hr tablet Take 1 tablet (25 mg total) by mouth at bedtime. 90 tablet 3  ? omeprazole (PRILOSEC) 40 MG capsule Take 1 capsule (40 mg total) by mouth in the morning and at bedtime. 112 capsule 0  ? diphenhydrAMINE (BENADRYL) 25 mg capsule Take 25 mg by mouth daily as needed for allergies.    ? ?No current facility-administered medications for this visit.  ? ? ?Allergies  ?Allergen Reactions  ? Darvon [Propoxyphene] Itching  ?  Made skin feel like it was crawling   ? Tessalon [Benzonatate] Itching  ?  Made skin feel like it was crawling   ? Latex Dermatitis and Rash  ? Morphine And Related Rash  ? ? ? ?REVIEW OF SYSTEMS:  ? ?'[X]'$  denotes positive finding, '[ ]'$  denotes negative finding ?Cardiac  Comments:  ?Chest pain or chest pressure:    ?Shortness of breath upon exertion:    ?Short of breath when lying flat:    ?Irregular heart rhythm:    ?    ?Vascular    ?Pain in calf, thigh, or hip brought on by ambulation:    ?Pain in feet at night that wakes you up from your sleep:     ?Blood clot in your veins:    ?Leg swelling:     ?    ?Pulmonary    ?Oxygen at home:    ?Productive cough:     ?Wheezing:     ?    ?Neurologic    ?Sudden weakness in arms or legs:     ?Sudden numbness in arms or legs:     ?Sudden onset of difficulty speaking or slurred speech:    ?Temporary loss of vision in one eye:     ?Problems with dizziness:     ?    ?Gastrointestinal    ?Blood in stool:     ?Vomited blood:     ?    ?Genitourinary    ?Burning when urinating:     ?Blood in urine:    ?    ?Psychiatric    ?Major depression:     ?    ?Hematologic    ?Bleeding problems:    ?Problems with blood clotting too easily:    ?    ?Skin    ?Rashes or ulcers:    ?    ?Constitutional    ?Fever or chills:    ? ? ?PHYSICAL EXAMINATION: ? ?Vitals:  ? 03/24/22 1251  ?BP: 98/62  ?Pulse: 68  ?  Resp: 18  ?Temp: 97.7 ?F (36.5 ?C)  ?TempSrc: Temporal  ?SpO2: 98%  ?Weight: 154 lb 4.8 oz (70 kg)   ?Height: '5\' 11"'$  (1.803 m)  ? ? ?General:  WDWN in NAD; vital signs documented above ?Gait: Not observed ?HENT: WNL, normocephalic ?Pulmonary: normal non-labored breathing , without Rales, rhonchi,  wheezing ?Cardiac: regular HR ?Abdomen: soft, NT, no masses ?Skin: without rashes ?Vascular Exam/Pulses: ? Right Left  ?Radial 2+ (normal) 2+ (normal)  ?DP 2+ (normal) 2+ (normal)  ?PT 2+ (normal) 2+ (normal)  ? ?Extremities: without ischemic changes, without Gangrene , without cellulitis; without open wounds; small varicosities of the left medial lower leg with only minimal stasis pigmentation changes ?Musculoskeletal: no muscle wasting or atrophy  ?Neurologic: A&O X 3;  No focal weakness or paresthesias are detected ?Psychiatric:  The pt has Normal affect. ? ? 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 ? ? ?ASSESSMENT/PLAN:: 46 y.o. male here for follow up for venous symptoms of left lower extremity ? ?-Subjectively patient states the edema of his left leg has progressed since being seen 6 months prior.  He also has progressive dull aching pain in his left leg throughout the course of the day.  He has been wearing thigh-high compression since his last office visit.  He has also been making an effort to elevate his leg periodically during the day.  He is using NSAIDs to help with venous symptoms however these are ineffective.  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  He also has an incompetent and dilated left greater saphenous vein from the saphenofemoral junction down to the level of the knee.  Although he has had extensive work-up for the etiology of his left leg discomfort, it  is still unclear as to the etiology of his symptoms.  We discussed that even with intervention of the left greater saphenous vein, he may not find relief.  With that being said, based on repeat venous r

## 2022-03-30 ENCOUNTER — Encounter: Payer: Self-pay | Admitting: Vascular Surgery

## 2022-03-30 ENCOUNTER — Ambulatory Visit (INDEPENDENT_AMBULATORY_CARE_PROVIDER_SITE_OTHER): Payer: Managed Care, Other (non HMO) | Admitting: Vascular Surgery

## 2022-03-30 VITALS — BP 97/61 | HR 71 | Temp 97.9°F | Resp 18 | Ht 71.0 in | Wt 155.4 lb

## 2022-03-30 DIAGNOSIS — I872 Venous insufficiency (chronic) (peripheral): Secondary | ICD-10-CM

## 2022-03-30 DIAGNOSIS — I83893 Varicose veins of bilateral lower extremities with other complications: Secondary | ICD-10-CM | POA: Diagnosis not present

## 2022-03-30 NOTE — Progress Notes (Signed)
? ? ?REASON FOR VISIT:  ? ?Follow-up of chronic venous insufficiency ? ?MEDICAL ISSUES:  ? ?CHRONIC VENOUS INSUFFICIENCY: This patient has continued symptoms of venous insufficiency despite having worn the thigh-high compression stockings and been elevating his legs at home.  He does have significant reflux in the left great saphenous vein and the vein is significantly dilated.  I think he would benefit from laser ablation of the left great saphenous vein and 10-20 stabs for the varicose veins along the medial aspect of his left calf. ? ?I have discussed the indications for endovenous laser ablation of the left GSV, that is to lower the pressure in the veins and potentially help relieve the symptoms from venous hypertension.  I have also discussed alternative options such as conservative treatment as described above. I have discussed the potential complications of the procedure, including bleeding, bruising, leg swelling, deep venous thrombosis (<1% risk), or failure of the vein to close <1% risk).  I have also explained that venous insufficiency is a chronic disease, and that the patient is at risk for recurrent varicose veins in the future.  All of the patient's questions were encouraged and answered. They are agreeable to proceed.  ? ?I have discussed with the patient the indications for stab phlebectomy.  I have explained to the patient that that will have small scars from the stab incisions.  I explained that the other risks include bruising, bleeding, and phlebitis.  ? ? ?HPI:  ? ?Jacob Davila is a pleasant 46 y.o. male who was seen by Arlee Muslim PA on 03/24/2022.  He was having significant symptoms from venous insufficiency and had both deep and superficial venous reflux.  Given that he failed conservative treatment he was felt to be potentially a candidate for laser ablation of the left great saphenous vein.  Of note, I had previously seen the patient on 09/29/2021 with chronic venous insufficiency.  At  that point there was not reflux in the proximal vein with the most recent duplex shows that this has progressed. ? ?Since he was seen last he continues to have aching pain and heaviness in the left leg which is aggravated by standing and relieved with elevation.  He has been wearing his thigh-high compression stockings which do help his symptoms some.  He also has been elevating his legs.  He denies any previous history of DVT. ? ? ? ?Past Medical History:  ?Diagnosis Date  ? Scoliosis   ? - s/p correctvie Sgx  ? Venous insufficiency of both lower extremities 05/2021  ? Lower extremity venous Doppler 05/29/2021: No evidence of DVT.  Venous reflux noted in the right GSV in the calf.  Venous reflux noted in the left common femoral vein and left greater saphenous vein in the thigh.  Left GSV in the calf as well as a left perforator vein. => Forwarded to vascular surgery:  ? ? ?Family History  ?Problem Relation Age of Onset  ? Atrial fibrillation Mother   ? Heart failure Mother   ? Hypertension Father   ? Hyperlipidemia Father   ? CAD Paternal Grandfather   ?     noted to have Angina (not aware of details)  ? Colon cancer Neg Hx   ? Stomach cancer Neg Hx   ? Esophageal cancer Neg Hx   ? Pancreatic cancer Neg Hx   ? ? ?SOCIAL HISTORY: ?Social History  ? ?Tobacco Use  ? Smoking status: Some Days  ?  Types: Cigars  ? Smokeless  tobacco: Never  ?Substance Use Topics  ? Alcohol use: Yes  ?  Comment: Occ  ? ? ?Allergies  ?Allergen Reactions  ? Darvon [Propoxyphene] Itching  ?  Made skin feel like it was crawling   ? Tessalon [Benzonatate] Itching  ?  Made skin feel like it was crawling   ? Latex Dermatitis and Rash  ? Morphine And Related Rash  ? ? ?Current Outpatient Medications  ?Medication Sig Dispense Refill  ? acetaminophen (TYLENOL) 500 MG tablet Take 500-1,000 mg by mouth every 6 (six) hours as needed for moderate pain.    ? hydrocortisone (ANUSOL-HC) 25 MG suppository Place 1 suppository (25 mg total) rectally 2 (two)  times daily. (Patient taking differently: Place 25 mg rectally at bedtime.) 14 suppository 0  ? metoprolol succinate (TOPROL XL) 25 MG 24 hr tablet Take 1 tablet (25 mg total) by mouth at bedtime. 90 tablet 3  ? omeprazole (PRILOSEC) 40 MG capsule Take 1 capsule (40 mg total) by mouth in the morning and at bedtime. 112 capsule 0  ? furosemide (LASIX) 20 MG tablet Take 1 tablet (20 mg total) by mouth daily. And/Or as directed (Patient not taking: Reported on 03/30/2022) 30 tablet 12  ? ?No current facility-administered medications for this visit.  ? ? ?REVIEW OF SYSTEMS:  ?'[X]'$  denotes positive finding, '[ ]'$  denotes negative finding ?Cardiac  Comments:  ?Chest pain or chest pressure:    ?Shortness of breath upon exertion:    ?Short of breath when lying flat:    ?Irregular heart rhythm:    ?    ?Vascular    ?Pain in calf, thigh, or hip brought on by ambulation:    ?Pain in feet at night that wakes you up from your sleep:     ?Blood clot in your veins:    ?Leg swelling:     ?    ?Pulmonary    ?Oxygen at home:    ?Productive cough:     ?Wheezing:     ?    ?Neurologic    ?Sudden weakness in arms or legs:     ?Sudden numbness in arms or legs:     ?Sudden onset of difficulty speaking or slurred speech:    ?Temporary loss of vision in one eye:     ?Problems with dizziness:     ?    ?Gastrointestinal    ?Blood in stool:     ?Vomited blood:     ?    ?Genitourinary    ?Burning when urinating:     ?Blood in urine:    ?    ?Psychiatric    ?Major depression:     ?    ?Hematologic    ?Bleeding problems:    ?Problems with blood clotting too easily:    ?    ?Skin    ?Rashes or ulcers:    ?    ?Constitutional    ?Fever or chills:    ? ?PHYSICAL EXAM:  ? ?Vitals:  ? 03/30/22 1051  ?BP: 97/61  ?Pulse: 71  ?Resp: 18  ?Temp: 97.9 ?F (36.6 ?C)  ?TempSrc: Temporal  ?SpO2: 97%  ?Weight: 155 lb 6.4 oz (70.5 kg)  ?Height: '5\' 11"'$  (1.803 m)  ? ? ?GENERAL: The patient is a well-nourished male, in no acute distress. The vital signs are documented  above. ?CARDIAC: There is a regular rate and rhythm.  ?VASCULAR: He has some dilated varicose veins along medial aspect of his left calf. ? ? ?PULMONARY: There  is good air exchange bilaterally without wheezing or rales. ?NEUROLOGIC: No focal weakness or paresthesias are detected. ?SKIN: There are no ulcers or rashes noted. ?PSYCHIATRIC: The patient has a normal affect. ? ?DATA:   ? ?VENOUS DUPLEX: I have independently reviewed the venous duplex scan that was done on 03/24/2022.  This was of the left lower extremity only. ? ?There was no evidence of DVT. ? ?There was deep venous reflux involving the common femoral vein, femoral vein, and popliteal vein. ? ?There was superficial venous reflux from the saphenofemoral junction down to the mid calf.  Diameters of the vein in the thigh ranged from 4.5-6 mm. ? ? ? ? ?Deitra Mayo ?[facility] ?Office [phone] ?

## 2022-04-05 ENCOUNTER — Other Ambulatory Visit: Payer: Self-pay | Admitting: *Deleted

## 2022-04-05 DIAGNOSIS — I83812 Varicose veins of left lower extremities with pain: Secondary | ICD-10-CM

## 2022-04-20 ENCOUNTER — Encounter: Payer: Self-pay | Admitting: Vascular Surgery

## 2022-04-20 ENCOUNTER — Ambulatory Visit: Payer: Managed Care, Other (non HMO) | Admitting: Vascular Surgery

## 2022-04-20 VITALS — BP 111/75 | HR 75 | Temp 97.5°F | Resp 16 | Ht 71.0 in | Wt 155.0 lb

## 2022-04-20 DIAGNOSIS — I872 Venous insufficiency (chronic) (peripheral): Secondary | ICD-10-CM

## 2022-04-20 DIAGNOSIS — I83812 Varicose veins of left lower extremities with pain: Secondary | ICD-10-CM | POA: Diagnosis not present

## 2022-04-20 HISTORY — PX: ENDOVENOUS ABLATION SAPHENOUS VEIN W/ LASER: SUR449

## 2022-04-20 NOTE — Progress Notes (Signed)
   Patient name: KIMSEY DEMAREE MRN: [id number] DOB: 06-16-1976 Sex: male  REASON FOR VISIT: For laser ablation of the left great saphenous vein with 10-20 stabs  HPI: DONYALE BERTHOLD is a 46 y.o. male who had presented with painful varicose veins in the left lower extremity.  He had failed conservative treatment and was felt to be a candidate for laser ablation of the left great saphenous vein with 10-20 stabs.  Current Outpatient Medications  Medication Sig Dispense Refill   acetaminophen (TYLENOL) 500 MG tablet Take 500-1,000 mg by mouth every 6 (six) hours as needed for moderate pain.     furosemide (LASIX) 20 MG tablet Take 1 tablet (20 mg total) by mouth daily. And/Or as directed (Patient not taking: Reported on 03/30/2022) 30 tablet 12   hydrocortisone (ANUSOL-HC) 25 MG suppository Place 1 suppository (25 mg total) rectally 2 (two) times daily. (Patient taking differently: Place 25 mg rectally at bedtime.) 14 suppository 0   metoprolol succinate (TOPROL XL) 25 MG 24 hr tablet Take 1 tablet (25 mg total) by mouth at bedtime. 90 tablet 3   omeprazole (PRILOSEC) 40 MG capsule Take 1 capsule (40 mg total) by mouth in the morning and at bedtime. 112 capsule 0   No current facility-administered medications for this visit.    PHYSICAL EXAM: Vitals:   04/20/22 1050  BP: 111/75  Pulse: 75  Resp: 16  Temp: (!) 97.5 F (36.4 C)  TempSrc: Temporal  SpO2: 96%  Weight: 155 lb (70.3 kg)  Height: '5\' 11"'$  (1.803 m)    PROCEDURE: Laser ablation left great saphenous vein with 10-20 stabs.  TECHNIQUE: The patient was taken to the exam room and the dilated varicose veins were marked with the patient standing.  Patient was then placed supine.  I looked at the left great saphenous vein myself with the SonoSite and I felt that I could cannulate this around the knee or just below that level.  Left leg was prepped and draped in usual sterile fashion.  After the skin was anesthetized and puncture lidocaine,  under ultrasound guidance, I cannulated the left great saphenous vein with a micropuncture needle and a micropuncture sheath was introduced over a wire.  I then advanced the J-wire to just below the saphenofemoral junction.  The dilator and peel-away sheath were then advanced over the wire and the wire and dilator removed.  I then position the laser fiber at the end of the sheath and the sheath was retracted.  The laser fiber was positioned 2.5 cm distal to the saphenofemoral junction.  Tumescent anesthesia was then administered circumferentially around the vein.  Laser ablation was then performed of the left great saphenous vein from 2-1/2 cm distal to the saphenofemoral junction to just below the knee.  50 J/cm was used at 11 W.  Attention was then turned to stab phlebectomies.  All the marked areas were anesthetized.  Using approximately 15 small stab incisions the vein was hooked graft and brought above the skin and bluntly excised.  Pressure was held for hemostasis.  Steri-Strips were applied.  A pressure dressing was applied.  Patient tolerated the procedure well he will return next week for follow-up duplex.  Deitra Mayo [facility] [phone]

## 2022-04-20 NOTE — Progress Notes (Signed)
     Laser Ablation Procedure     Date: 04/20/2022   Jacob Davila DOB:10/26/1976  Consent signed: Yes      Surgeon: Gae Gallop MD   Procedure: Laser Ablation: left Greater Saphenous Vein  BP 111/75 (BP Location: Left Arm, Patient Position: Sitting, Cuff Size: Normal)   Pulse 75   Temp (!) 97.5 F (36.4 C) (Temporal)   Resp 16   Ht '5\' 11"'$  (1.803 m)   Wt 155 lb (70.3 kg)   SpO2 96%   BMI 21.62 kg/m   Tumescent Anesthesia: 450 cc 0.9% NaCl with 50 cc Lidocaine HCL 1%  and 15 cc 8.4% NaHCO3  Local Anesthesia: 7 cc Lidocaine HCL and NaHCO3 (ratio 2:1)  7 watts continuous mode     Total energy: 1813 Joules     Total time: 302 seconds  Treatment Length  39 cm   Laser Fiber Ref. #  [id number]     Lot #  [id number]   Stab Phlebectomy: 10-20 Sites: Calf  Patient tolerated procedure well  Notes: Patient wore face mask.  All staff members wore facial masks. Non-latex gloves used for procedure as [name] has allergy to latex.    Description of Procedure:  After marking the course of the secondary varicosities, the patient was placed on the operating table in the supine position, and the left leg was prepped and draped in sterile fashion.   Local anesthetic was administered and under ultrasound guidance the saphenous vein was accessed with a micro needle and guide wire; then the mirco puncture sheath was placed.  A guide wire was inserted saphenofemoral junction , followed by a 5 french sheath.  The position of the sheath and then the laser fiber below the junction was confirmed using the ultrasound.  Tumescent anesthesia was administered along the course of the saphenous vein using ultrasound guidance. The patient was placed in Trendelenburg position and protective laser glasses were placed on patient and staff, and the laser was fired at 7 watts continuous mode for a total of 1813 joules.   For stab phlebectomies, local anesthetic was administered at the previously marked  varicosities, and tumescent anesthesia was administered around the vessels.  Ten to 20 stab wounds were made using the tip of an 11 blade. And using the vein hook, the phlebectomies were performed using a hemostat to avulse the varicosities.  Adequate hemostasis was achieved.     Steri strips were applied to the stab wounds and ABD pads and thigh high compression stockings were applied.  Ace wrap bandages were applied over the phlebectomy sites and at the top of the saphenofemoral junction. Blood loss was less than 15 cc.  Discharge instructions reviewed with patient and hardcopy of discharge instructions given to patient to take home. The patient ambulated out of the operating room having tolerated the procedure well.

## 2022-04-25 ENCOUNTER — Encounter: Payer: Managed Care, Other (non HMO) | Admitting: Internal Medicine

## 2022-04-27 ENCOUNTER — Ambulatory Visit (INDEPENDENT_AMBULATORY_CARE_PROVIDER_SITE_OTHER): Payer: Managed Care, Other (non HMO) | Admitting: Vascular Surgery

## 2022-04-27 ENCOUNTER — Encounter: Payer: Self-pay | Admitting: Vascular Surgery

## 2022-04-27 ENCOUNTER — Ambulatory Visit (HOSPITAL_COMMUNITY)
Admission: RE | Admit: 2022-04-27 | Discharge: 2022-04-27 | Disposition: A | Discharge status: Home / Self Care | Payer: Managed Care, Other (non HMO) | Source: Ambulatory Visit | Attending: Vascular Surgery | Admitting: Vascular Surgery

## 2022-04-27 VITALS — BP 94/63 | HR 107 | Temp 97.2°F | Resp 16 | Ht 71.0 in | Wt 155.0 lb

## 2022-04-27 DIAGNOSIS — I83812 Varicose veins of left lower extremities with pain: Secondary | ICD-10-CM

## 2022-04-27 NOTE — Progress Notes (Signed)
   Patient name: Jacob Davila MRN: [id number] DOB: Jan 03, 1976 Sex: male  REASON FOR VISIT: Follow-up after laser ablation of the left great saphenous vein with 10-20 stab  HPI: Jacob Davila is a 46 y.o. male who presented with painful varicose veins of the left lower extremity.  He had failed conservative treatment and ultimately was felt to be a candidate for laser ablation of the left great saphenous vein with 10-20 stabs.  He underwent the procedure on 04/20/2022.  He comes in for a 1 week follow-up visit.  Since I saw him last he has no specific complaints.  He has had some bruising.  He denies chest pain or breath.  Current Outpatient Medications  Medication Sig Dispense Refill   acetaminophen (TYLENOL) 500 MG tablet Take 500-1,000 mg by mouth every 6 (six) hours as needed for moderate pain.     hydrocortisone (ANUSOL-HC) 25 MG suppository Place 1 suppository (25 mg total) rectally 2 (two) times daily. (Patient taking differently: Place 25 mg rectally at bedtime.) 14 suppository 0   metoprolol succinate (TOPROL XL) 25 MG 24 hr tablet Take 1 tablet (25 mg total) by mouth at bedtime. 90 tablet 3   furosemide (LASIX) 20 MG tablet Take 1 tablet (20 mg total) by mouth daily. And/Or as directed (Patient not taking: Reported on 03/30/2022) 30 tablet 12   omeprazole (PRILOSEC) 40 MG capsule Take 1 capsule (40 mg total) by mouth in the morning and at bedtime. 112 capsule 0   No current facility-administered medications for this visit.   REVIEW OF SYSTEMS: Valu.Nieves ] denotes positive finding; [  ] denotes negative finding  CARDIOVASCULAR:  '[ ]'$  chest pain   '[ ]'$  dyspnea on exertion  '[ ]'$  leg swelling  CONSTITUTIONAL:  '[ ]'$  fever   '[ ]'$  chills  PHYSICAL EXAM: Vitals:   04/27/22 1022  BP: 94/63  Pulse: (!) 107  Resp: 16  Temp: (!) 97.2 F (36.2 C)  TempSrc: Temporal  SpO2: 96%  Weight: 155 lb (70.3 kg)  Height: '5\' 11"'$  (1.803 m)   GENERAL: The patient is a well-nourished male, in no acute distress. The  vital signs are documented above. CARDIOVASCULAR: There is a regular rate and rhythm. PULMONARY: There is good air exchange bilaterally without wheezing or rales. VASCULAR: He has no significant left leg swelling.  He has moderate bruising along the medial thigh and calf.  His Steri-Strips are in place.  DATA:  VENOUS DUPLEX: I have independently interpreted his venous duplex scan today.  There is no evidence of DVT in the left lower extremity.  The left great saphenous vein is successfully closed from the knee to within 1.7 cm of the saphenofemoral junction.  MEDICAL ISSUES:  S/P LASER ABLATION LEFT GREAT SAPHENOUS VEIN WITH 10-20 STABS: Patient is doing well status post laser ablation of the left great saphenous vein with 10-20 stabs.  His venous duplex scan shows no evidence of the DVT with an excellent result.  I encouraged him to continue to elevate his legs daily.  He will wear his thigh-high compression stockings for 1 more week.  He has no plans for the right leg.  I will see him back as needed.  Deitra Mayo [facility] [phone]

## 2022-06-02 ENCOUNTER — Encounter (HOSPITAL_BASED_OUTPATIENT_CLINIC_OR_DEPARTMENT_OTHER): Payer: Self-pay | Admitting: Family Medicine

## 2022-06-02 ENCOUNTER — Ambulatory Visit (INDEPENDENT_AMBULATORY_CARE_PROVIDER_SITE_OTHER): Payer: Managed Care, Other (non HMO) | Admitting: Family Medicine

## 2022-06-02 DIAGNOSIS — I83893 Varicose veins of bilateral lower extremities with other complications: Secondary | ICD-10-CM

## 2022-06-02 DIAGNOSIS — Z0000 Encounter for general adult medical examination without abnormal findings: Secondary | ICD-10-CM

## 2022-06-02 DIAGNOSIS — R29898 Other symptoms and signs involving the musculoskeletal system: Secondary | ICD-10-CM | POA: Diagnosis not present

## 2022-06-02 NOTE — Assessment & Plan Note (Addendum)
Had laser ablation with vascular surgery - this was about 6 weeks ago. He has been doing better since having procedure completed. Primary complaint at this time is intermittent cramping of left lower extremity - mainly of left calf. Swelling is greatly improved as well. Patient is doing quite well, recommend continuing with recommendations as per vascular surgery.  Follow-up with them is as needed at this time Can continue with intermittent elevation of legs to help with symptoms as needed He is requesting to have a note for work which allows him to gradually increase activities, note provided today

## 2022-06-02 NOTE — Progress Notes (Signed)
    Procedures performed today:    None.  Independent interpretation of notes and tests performed by another provider:   None.  Brief History, Exam, Impression, and Recommendations:    BP 96/66   Pulse 70   Ht '5\' 11"'$  (1.803 m)   Wt 160 lb 14.4 oz (73 kg)   SpO2 100%   BMI 22.44 kg/m   Varicose veins of leg with swelling, bilateral Had laser ablation with vascular surgery - this was about 6 weeks ago. He has been doing better since having procedure completed. Primary complaint at this time is intermittent cramping of left lower extremity - mainly of left calf. Swelling is greatly improved as well. Patient is doing quite well, recommend continuing with recommendations as per vascular surgery.  Follow-up with them is as needed at this time Can continue with intermittent elevation of legs to help with symptoms as needed He is requesting to have a note for work which allows him to gradually increase activities, note provided today  Decreased strength of lower extremity Feels that he has some decreased strength in his legs as well as some cramping, particularly in left leg as discussed above.  Discussed options with patient, we will proceed with home exercises, will also place referral to physical therapy for further evaluation and management  Return in about 3 months (around 09/02/2022) for CPE with [facility] a few days prior.   ___________________________________________ Harshitha Fretz de [place], MD, ABFM, CAQSM [facility] [place]

## 2022-06-02 NOTE — Patient Instructions (Signed)

## 2022-06-02 NOTE — Assessment & Plan Note (Signed)
Feels that he has some decreased strength in his legs as well as some cramping, particularly in left leg as discussed above.  Discussed options with patient, we will proceed with home exercises, will also place referral to physical therapy for further evaluation and management

## 2022-06-07 ENCOUNTER — Encounter: Payer: Self-pay | Admitting: Vascular Surgery

## 2022-06-17 ENCOUNTER — Telehealth: Payer: Self-pay | Admitting: Internal Medicine

## 2022-06-17 ENCOUNTER — Encounter: Payer: Managed Care, Other (non HMO) | Admitting: Internal Medicine

## 2022-07-28 ENCOUNTER — Encounter: Payer: Managed Care, Other (non HMO) | Admitting: Internal Medicine

## 2022-08-11 ENCOUNTER — Ambulatory Visit (AMBULATORY_SURGERY_CENTER): Payer: Managed Care, Other (non HMO)

## 2022-08-11 VITALS — Ht 71.0 in | Wt 161.0 lb

## 2022-08-11 DIAGNOSIS — K259 Gastric ulcer, unspecified as acute or chronic, without hemorrhage or perforation: Secondary | ICD-10-CM

## 2022-08-11 NOTE — Progress Notes (Signed)
No egg or soy allergy known to patient  No issue.eggs known to pt with past sedation with any surgeries or procedures Patient denies ever being told they had issues or difficulty with intubation  No FH of Malignant Hyperthermia Pt is not on diet pills Pt is not on  home 02  Pt is not on blood thinners  Pt denies issues with constipation  No A fib or A flutter Have any cardiac testing pending--no

## 2022-08-26 ENCOUNTER — Ambulatory Visit (HOSPITAL_BASED_OUTPATIENT_CLINIC_OR_DEPARTMENT_OTHER): Payer: BC Managed Care – PPO

## 2022-08-26 DIAGNOSIS — Z0000 Encounter for general adult medical examination without abnormal findings: Secondary | ICD-10-CM

## 2022-08-27 LAB — COMPREHENSIVE METABOLIC PANEL
ALT: 69 IU/L — ABNORMAL HIGH (ref 0–44)
AST: 103 IU/L — ABNORMAL HIGH (ref 0–40)
Albumin/Globulin Ratio: 1.6 (ref 1.2–2.2)
Albumin: 4.4 g/dL (ref 4.1–5.1)
Alkaline Phosphatase: 104 IU/L (ref 44–121)
BUN/Creatinine Ratio: 6 — ABNORMAL LOW (ref 9–20)
BUN: 7 mg/dL (ref 6–24)
Bilirubin Total: 0.7 mg/dL (ref 0.0–1.2)
CO2: 22 mmol/L (ref 20–29)
Calcium: 9.4 mg/dL (ref 8.7–10.2)
Chloride: 99 mmol/L (ref 96–106)
Creatinine, Ser: 1.16 mg/dL (ref 0.76–1.27)
Globulin, Total: 2.7 g/dL (ref 1.5–4.5)
Glucose: 87 mg/dL (ref 70–99)
Potassium: 4.2 mmol/L (ref 3.5–5.2)
Sodium: 140 mmol/L (ref 134–144)
Total Protein: 7.1 g/dL (ref 6.0–8.5)
eGFR: 78 mL/min/{1.73_m2} (ref >59)

## 2022-08-27 LAB — LIPID PANEL
Chol/HDL Ratio: 1.6 ratio (ref 0.0–5.0)
Cholesterol, Total: 244 mg/dL — ABNORMAL HIGH (ref 100–199)
HDL: 150 mg/dL (ref >39)
LDL Chol Calc (NIH): 58 mg/dL (ref 0–99)
Triglycerides: 241 mg/dL — ABNORMAL HIGH (ref 0–149)
VLDL Cholesterol Cal: 36 mg/dL (ref 5–40)

## 2022-08-27 LAB — CBC WITH DIFFERENTIAL/PLATELET
Basophils Absolute: 0.1 10*3/uL (ref 0.0–0.2)
Basos: 2 %
EOS (ABSOLUTE): 0.1 10*3/uL (ref 0.0–0.4)
Eos: 4 %
Hematocrit: 34.3 % — ABNORMAL LOW (ref 37.5–51.0)
Hemoglobin: 12.1 g/dL — ABNORMAL LOW (ref 13.0–17.7)
Immature Grans (Abs): 0 10*3/uL (ref 0.0–0.1)
Immature Granulocytes: 0 %
Lymphocytes Absolute: 1.7 10*3/uL (ref 0.7–3.1)
Lymphs: 45 %
MCH: 37.1 pg — ABNORMAL HIGH (ref 26.6–33.0)
MCHC: 35.3 g/dL (ref 31.5–35.7)
MCV: 105 fL — ABNORMAL HIGH (ref 79–97)
Monocytes Absolute: 0.4 10*3/uL (ref 0.1–0.9)
Monocytes: 12 %
Neutrophils Absolute: 1.3 10*3/uL — ABNORMAL LOW (ref 1.4–7.0)
Neutrophils: 37 %
Platelets: 214 10*3/uL (ref 150–450)
RBC: 3.26 x10E6/uL — ABNORMAL LOW (ref 4.14–5.80)
RDW: 14.3 % (ref 11.6–15.4)
WBC: 3.6 10*3/uL (ref 3.4–10.8)

## 2022-08-27 LAB — TSH RFX ON ABNORMAL TO FREE T4: TSH: 1.35 u[IU]/mL (ref 0.450–4.500)

## 2022-08-28 ENCOUNTER — Encounter: Payer: Self-pay | Admitting: Certified Registered Nurse Anesthetist

## 2022-08-29 ENCOUNTER — Encounter: Payer: Self-pay | Admitting: Internal Medicine

## 2022-09-02 ENCOUNTER — Encounter (HOSPITAL_BASED_OUTPATIENT_CLINIC_OR_DEPARTMENT_OTHER): Payer: Managed Care, Other (non HMO) | Admitting: Family Medicine

## 2022-09-05 ENCOUNTER — Ambulatory Visit (AMBULATORY_SURGERY_CENTER): Payer: BC Managed Care – PPO | Admitting: Internal Medicine

## 2022-09-05 ENCOUNTER — Emergency Department (HOSPITAL_COMMUNITY)
Admission: EM | Admit: 2022-09-05 | Discharge: 2022-09-05 | Disposition: A | Discharge status: Home / Self Care | Payer: BC Managed Care – PPO | Attending: Emergency Medicine | Admitting: Emergency Medicine

## 2022-09-05 ENCOUNTER — Encounter: Payer: Self-pay | Admitting: Internal Medicine

## 2022-09-05 ENCOUNTER — Other Ambulatory Visit: Payer: Self-pay

## 2022-09-05 VITALS — BP 149/91 | HR 95 | Temp 97.8°F | Resp 12 | Ht 71.0 in | Wt 161.0 lb

## 2022-09-05 DIAGNOSIS — K219 Gastro-esophageal reflux disease without esophagitis: Secondary | ICD-10-CM | POA: Diagnosis not present

## 2022-09-05 DIAGNOSIS — R569 Unspecified convulsions: Secondary | ICD-10-CM

## 2022-09-05 DIAGNOSIS — K259 Gastric ulcer, unspecified as acute or chronic, without hemorrhage or perforation: Secondary | ICD-10-CM | POA: Diagnosis present

## 2022-09-05 DIAGNOSIS — Z91040 Latex allergy status: Secondary | ICD-10-CM | POA: Diagnosis not present

## 2022-09-05 LAB — CBC WITH DIFFERENTIAL/PLATELET
Abs Immature Granulocytes: 0.01 10*3/uL (ref 0.00–0.07)
Basophils Absolute: 0.1 10*3/uL (ref 0.0–0.1)
Basophils Relative: 2 %
Eosinophils Absolute: 0.1 10*3/uL (ref 0.0–0.5)
Eosinophils Relative: 1 %
HCT: 35.9 % — ABNORMAL LOW (ref 39.0–52.0)
Hemoglobin: 12.1 g/dL — ABNORMAL LOW (ref 13.0–17.0)
Immature Granulocytes: 0 %
Lymphocytes Relative: 42 %
Lymphs Abs: 2.1 10*3/uL (ref 0.7–4.0)
MCH: 36.3 pg — ABNORMAL HIGH (ref 26.0–34.0)
MCHC: 33.7 g/dL (ref 30.0–36.0)
MCV: 107.8 fL — ABNORMAL HIGH (ref 80.0–100.0)
Monocytes Absolute: 0.6 10*3/uL (ref 0.1–1.0)
Monocytes Relative: 12 %
Neutro Abs: 2.1 10*3/uL (ref 1.7–7.7)
Neutrophils Relative %: 43 %
Platelets: 196 10*3/uL (ref 150–400)
RBC: 3.33 MIL/uL — ABNORMAL LOW (ref 4.22–5.81)
RDW: 14.6 % (ref 11.5–15.5)
WBC: 4.9 10*3/uL (ref 4.0–10.5)
nRBC: 0 % (ref 0.0–0.2)

## 2022-09-05 LAB — COMPREHENSIVE METABOLIC PANEL
ALT: 39 U/L (ref 0–44)
AST: 70 U/L — ABNORMAL HIGH (ref 15–41)
Albumin: 3.6 g/dL (ref 3.5–5.0)
Alkaline Phosphatase: 82 U/L (ref 38–126)
Anion gap: 11 (ref 5–15)
BUN: 11 mg/dL (ref 6–20)
CO2: 22 mmol/L (ref 22–32)
Calcium: 8 mg/dL — ABNORMAL LOW (ref 8.9–10.3)
Chloride: 105 mmol/L (ref 98–111)
Creatinine, Ser: 1.05 mg/dL (ref 0.61–1.24)
GFR, Estimated: >60 mL/min (ref >60)
Glucose, Bld: 83 mg/dL (ref 70–99)
Potassium: 4.3 mmol/L (ref 3.5–5.1)
Sodium: 138 mmol/L (ref 135–145)
Total Bilirubin: 0.8 mg/dL (ref 0.3–1.2)
Total Protein: 6.3 g/dL — ABNORMAL LOW (ref 6.5–8.1)

## 2022-09-05 LAB — MAGNESIUM: Magnesium: 1.6 mg/dL — ABNORMAL LOW (ref 1.7–2.4)

## 2022-09-05 LAB — PHOSPHORUS: Phosphorus: 2.9 mg/dL (ref 2.5–4.6)

## 2022-09-05 LAB — CBG MONITORING, ED: Glucose-Capillary: 81 mg/dL (ref 70–99)

## 2022-09-05 MED ORDER — SODIUM CHLORIDE 0.9 % IV SOLN: 500.0000 mL | Freq: Once | INTRAVENOUS | Status: DC

## 2022-09-05 MED ORDER — MAGNESIUM OXIDE -MG SUPPLEMENT 400 (240 MG) MG PO TABS
400.0000 mg | ORAL_TABLET | Freq: Once | ORAL | Status: AC
  Administered 2022-09-05: 400 mg via ORAL
  Filled 2022-09-05: qty 1

## 2022-09-05 NOTE — Progress Notes (Signed)
Patient went back to the recovery area after an uneventful EGD procedure. I went to speak with him about the EGD results but then found him still too sedated to understand the discussion. Thus I stepped away for 1-2 minutes. Shortly afterwards, I was called back to the recovery area since patient was noted to have significant muscle jerking, potentially a seizure. I went to bedside and saw that he was having muscle contractions in his torso, upper extremities, and lower extremities. With prompting, patient was eventually able to open his eyes and respond to questions. 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 His sister stated that he was on phenobarbital as a child. Unclear if he has been using any recreational substances recently, though he does have a history of substantial alcohol use. He also has a history of tremors, which worsen when he does not take his beta blocker. His last dose of his beta blocker was last night. Patient was able awaken more but was still somewhat groggy. Still had some mild muscle contractions, which were improving. He was also noted to have mild right eyelid droop. Vitals were stable through this whole episode. With concern for possible seizures, will transfer to ED to see if the patient needs to be placed on anti-seizure medications. Recommend neurology consult. Communicated with the patient's sister Lattie Haw about what happened who will meet him at [facility] ED.

## 2022-09-05 NOTE — Progress Notes (Signed)
[place] to PACU room with patient unresponsive and having questionable grand mal seizures with tremors throughout body.  Airway protection performed with O2 per [facility] at 3 liters due to O2 sat down to 90% on RA. 1034 patient more alert and answering questions.  Patient gave clearance to speak with sister about his condition. vv

## 2022-09-05 NOTE — Progress Notes (Signed)
1002 Robinul 0.1 mg IV given due large amount of secretions upon assessment.  MD made aware, vss 

## 2022-09-05 NOTE — Progress Notes (Signed)
VS completed by [place]er.   Pt's states no medical or surgical changes since previsit or office visit.  

## 2022-09-05 NOTE — Discharge Instructions (Addendum)
You were seen in the emergency department for your seizure-like activity.  It is unclear if you had a true seizure or or just having muscle shakes called myoclonic jerks from the anesthesia medication that you received.  Your labs here were all normal and you had no further activity concerning for seizure in the ER.  You should follow-up with your primary doctor in the next few days to have your symptoms rechecked.  You should return to the emergency department for recurrent seizure-like activity, if you have back-to-back seizures without returning to your normal self in between, you have prolonged seizures lasting more than 5 to 10 minutes, or if you have any other new or concerning symptoms.

## 2022-09-05 NOTE — ED Provider Notes (Signed)
[facility] EMERGENCY DEPARTMENT Provider Note   CSN: [id number] Arrival date & time: 09/05/22  1128     History  Chief Complaint  Patient presents with   Seizures    Jacob Davila is a 46 y.o. male.  Patient is a 46 year old male with a past medical history of tachycardia on metoprolol presenting to the emergency department from endoscopy for possible seizure.  Patient states that he had his endoscopy this morning without any complications and remembers feeling drowsy in the postop area when he was suddenly surrounded by multiple people.  He states he is unsure what happened after that.  He states he does not remember shaking or feeling chilled.  He denies any tongue bite or urinary incontinence.  He states that he had a seizure in childhood but has never been on any seizure medication.  He states he drinks alcohol occasionally but denies any daily alcohol use or any history of alcohol withdrawal or alcohol withdrawal seizures.  Per GI notes, the patient was having tremors of his arms, legs and torso and after prompting he was able to open his eyes and respond during the episode.  No medications given.  The history is provided by the patient and medical records.  Seizures      Home Medications Prior to Admission medications   Medication Sig Start Date End Date Taking? Authorizing Provider  acetaminophen (TYLENOL) 500 MG tablet Take 500-1,000 mg by mouth every 6 (six) hours as needed for moderate pain.    [provider]  hydrocortisone (ANUSOL-HC) 25 MG suppository Place 1 suppository (25 mg total) rectally 2 (two) times daily. Patient not taking: Reported on 08/11/2022 02/21/22   Sharyn Creamer, MD  metoprolol succinate (TOPROL XL) 25 MG 24 hr tablet Take 1 tablet (25 mg total) by mouth at bedtime. 12/31/21   Leonie Man, MD  Multiple Vitamin (MULTIVITAMIN) tablet Take 1 tablet by mouth daily.    [provider]  omeprazole (PRILOSEC) 40 MG  capsule Take 1 capsule (40 mg total) by mouth in the morning and at bedtime. 02/24/22 08/11/24  Sharyn Creamer, MD      Allergies    Allegra [fexofenadine], Darvon [propoxyphene], Tessalon [benzonatate], Latex, and Morphine and related    Review of Systems   Review of Systems  Neurological:  Positive for seizures.    Physical Exam Updated Vital Signs BP 124/79   Pulse 89   Temp 98.2 F (36.8 C) (Oral)   Resp 12   Ht '5\' 11"'$  (1.803 m)   Wt 73 kg   SpO2 96%   BMI 22.45 kg/m  Physical Exam Vitals and nursing note reviewed.  Constitutional:      General: He is not in acute distress.    Appearance: Normal appearance.     Comments: Awake but slightly drowsy appearing  HENT:     Head: Normocephalic and atraumatic.     Nose: Nose normal.     Mouth/Throat:     Mouth: Mucous membranes are moist.     Pharynx: Oropharynx is clear.  Eyes:     Extraocular Movements: Extraocular movements intact.     Conjunctiva/sclera: Conjunctivae normal.     Pupils: Pupils are equal, round, and reactive to light.  Cardiovascular:     Rate and Rhythm: Normal rate and regular rhythm.     Pulses: Normal pulses.     Heart sounds: Normal heart sounds.  Pulmonary:     Effort: Pulmonary effort is normal.  Breath sounds: Normal breath sounds.  Abdominal:     General: Abdomen is flat.     Palpations: Abdomen is soft.     Tenderness: There is no abdominal tenderness.  Musculoskeletal:        General: No tenderness or deformity. Normal range of motion.     Cervical back: Normal range of motion and neck supple.  Skin:    General: Skin is warm and dry.  Neurological:     General: No focal deficit present.     Mental Status: He is oriented to person, place, and time.     Cranial Nerves: No cranial nerve deficit.     Sensory: No sensory deficit.     Motor: No weakness.  Psychiatric:        Mood and Affect: Mood normal.        Behavior: Behavior normal.     ED Results / Procedures / Treatments    Labs (all labs ordered are listed, but only abnormal results are displayed) Labs Reviewed  COMPREHENSIVE METABOLIC PANEL - Abnormal; Notable for the following components:      Result Value   Calcium 8.0 (*)    Total Protein 6.3 (*)    AST 70 (*)    All other components within normal limits  CBC WITH DIFFERENTIAL/PLATELET - Abnormal; Notable for the following components:   RBC 3.33 (*)    Hemoglobin 12.1 (*)    HCT 35.9 (*)    MCV 107.8 (*)    MCH 36.3 (*)    All other components within normal limits  MAGNESIUM - Abnormal; Notable for the following components:   Magnesium 1.6 (*)    All other components within normal limits  PHOSPHORUS  CBG MONITORING, ED    EKG EKG Interpretation  Date/Time:  Monday September 05 2022 11:36:17 EDT Ventricular Rate:  92 PR Interval:  124 QRS Duration: 82 QT Interval:  359 QTC Calculation: 445 R Axis:   56 Text Interpretation: Sinus rhythm No significant change since last tracing Confirmed by Leanord Asal ([id number]) on 09/05/2022 12:41:25 PM  Radiology No results found.  Procedures Procedures    Medications Ordered in ED Medications  magnesium oxide (MAG-OX) tablet 400 mg (400 mg Oral Given 09/05/22 1244)    ED Course/ Medical Decision Making/ A&P Clinical Course as of 09/05/22 1626  Mon Sep 05, 2022  1241 Patient has mildly low magnesium which will be repleted.  Hemoglobin at baseline, AST improved from prior labs. [VK]  0177 Patient is awake and alert resting in bed comfortably.  He has had no further seizure-like activity in the emergency department.  He will be p.o. challenge and attempt to ambulate to make sure that he is steady post sedation that he received this morning.  Unclear if this was a seizure this morning versus myoclonic jerks and will likely be stable for discharge with primary care follow-up. [VK]    Clinical Course User Index [VK] Kemper Durie, DO                           Medical Decision  Making This patient presents to the ED with chief complaint(s) of seizure like activity with pertinent past medical history of tachycardia on metoprolol which further complicates the presenting complaint. The complaint involves an extensive differential diagnosis and also carries with it a high risk of complications and morbidity.    The differential diagnosis includes seizure, syncope, myoclonic jerks, electrolyte abnormality, dehydration,  arrhythmia  Additional history obtained: Additional history obtained from endoscopy providers Records reviewed recent endoscopy procedure today  ED Course and Reassessment: Patient is drowsy but awake in no acute distress at his neurologic baseline.  Unclear if drowsiness is related to being postictal or from sedation from endoscopy this morning.  Based on records and story unclear if this was true seizure or may have been myoclonic jerks or near syncope as the patient did seem to be arousable during the episode and had no tongue bite or urinary incontinence.  He will have labs and EKG and Accu-Chek performed to evaluate for possible seizure-like activity and will be monitored for any further seizure activity in the ED.  Independent labs interpretation:  The following labs were independently interpreted: At patient's baseline, within normal range  Independent visualization of imaging: N/A  Consultation: - Consulted or discussed management/test interpretation w/ external professional: N/A  Consideration for admission or further workup: patient has no emergent conditions requiring admission at this time and is stable for discharge with primary care follow up Social Determinants of health: N/A    Amount and/or Complexity of Data Reviewed Labs: ordered.  Risk OTC drugs.          Final Clinical Impression(s) / ED Diagnoses Final diagnoses:  Seizure-like activity [facility])    Rx / [place] Orders ED Discharge Orders     None         Kemper Durie, DO 09/05/22 1626

## 2022-09-05 NOTE — Progress Notes (Signed)
GASTROENTEROLOGY PROCEDURE H&P NOTE   Primary Care Physician: [place], Blondell Reveal, MD    Reason for Procedure:   Follow up gastric ulcers  Plan:    EGD  Patient is appropriate for endoscopic procedure(s) in the ambulatory ([facility]) setting.  The nature of the procedure, as well as the risks, benefits, and alternatives were carefully and thoroughly reviewed with the patient. Ample time for discussion and questions allowed. The patient understood, was satisfied, and agreed to proceed.     HPI: Jacob Davila is a 46 y.o. male who presents for EGD for evaluation of follow up of gastric ulcers.  Past Medical History:  Diagnosis Date   Allergy    Scoliosis    - s/p correctvie Sgx   Tachycardia    Venous insufficiency of both lower extremities 05/2021   Lower extremity venous Doppler 05/29/2021: No evidence of DVT.  Venous reflux noted in the right GSV in the calf.  Venous reflux noted in the left common femoral vein and left greater saphenous vein in the thigh.  Left GSV in the calf as well as a left perforator vein. => Forwarded to vascular surgery:    Past Surgical History:  Procedure Laterality Date   BACK SURGERY  03/1990   Scoliosis   COLONOSCOPY     ENDOVENOUS ABLATION SAPHENOUS VEIN W/ LASER Left 04/20/2022   endovenous laser ablation left greater saphenous vein and stab phlebectomy 10-20 incisions left leg by Gae Gallop MD   TRANSTHORACIC ECHOCARDIOGRAM  06/08/2019   EF 60-65%.  Normal wall motion.  Normal relaxation.  Mild aortic valve thickening but no sclerosis/stenosis.  Normal echo   UPPER GASTROINTESTINAL ENDOSCOPY  02/21/2022   [name]    Prior to Admission medications   Medication Sig Start Date End Date Taking? Authorizing Provider  metoprolol succinate (TOPROL XL) 25 MG 24 hr tablet Take 1 tablet (25 mg total) by mouth at bedtime. 12/31/21  Yes Leonie Man, MD  Multiple Vitamin (MULTIVITAMIN) tablet Take 1 tablet by mouth daily.   Yes [provider]  omeprazole (PRILOSEC) 40 MG capsule Take 1 capsule (40 mg total) by mouth in the morning and at bedtime. 02/24/22 08/11/24 Yes Sharyn Creamer, MD  acetaminophen (TYLENOL) 500 MG tablet Take 500-1,000 mg by mouth every 6 (six) hours as needed for moderate pain.    [provider]  hydrocortisone (ANUSOL-HC) 25 MG suppository Place 1 suppository (25 mg total) rectally 2 (two) times daily. Patient not taking: Reported on 08/11/2022 02/21/22   Sharyn Creamer, MD    Current Outpatient Medications  Medication Sig Dispense Refill   metoprolol succinate (TOPROL XL) 25 MG 24 hr tablet Take 1 tablet (25 mg total) by mouth at bedtime. 90 tablet 3   Multiple Vitamin (MULTIVITAMIN) tablet Take 1 tablet by mouth daily.     omeprazole (PRILOSEC) 40 MG capsule Take 1 capsule (40 mg total) by mouth in the morning and at bedtime. 112 capsule 0   acetaminophen (TYLENOL) 500 MG tablet Take 500-1,000 mg by mouth every 6 (six) hours as needed for moderate pain.     hydrocortisone (ANUSOL-HC) 25 MG suppository Place 1 suppository (25 mg total) rectally 2 (two) times daily. (Patient not taking: Reported on 08/11/2022) 14 suppository 0   Current Facility-Administered Medications  Medication Dose Route Frequency Provider Last Rate Last Admin   0.9 %  sodium chloride infusion  500 mL Intravenous Once Sharyn Creamer, MD  Allergies as of 09/05/2022 - Review Complete 09/05/2022  Allergen Reaction Noted   Allegra [fexofenadine] Other (See Comments) 08/11/2022   Darvon [propoxyphene] Itching 05/27/2021   Tessalon [benzonatate] Itching 05/27/2021   Latex Dermatitis and Rash 04/19/2021   Morphine and related Rash 04/19/2021    Family History  Problem Relation Age of Onset   Atrial fibrillation Mother    Heart failure Mother    Hypertension Father    Hyperlipidemia Father    Colon cancer Maternal Aunt    Colon cancer Paternal Uncle    CAD Paternal Grandfather        noted to have Angina  (not aware of details)   Stomach cancer Neg Hx    Esophageal cancer Neg Hx    Rectal cancer Neg Hx    Pancreatic cancer Neg Hx     Social History   Socioeconomic History   Marital status: Single    Spouse name: Not on file   Number of children: Not on file   Years of education: Not on file   Highest education level: Not on file  Occupational History   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  He reports the only medication he takes is Benadryl, and that he takes approximately 2 tablets/day.  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  He has never had symptoms like this before.     Social Determinants of Health   Financial Resource Strain: Not on file  Food Insecurity: Not on file  Transportation Needs: Not on file  Physical Activity: Not on file  Stress: Not on file  Social Connections: Not on file  Intimate Partner Violence: Not on file    Physical Exam: Vital signs in last 24 hours: BP 126/74   Pulse 90   Temp 97.8 F (36.6 C) (Temporal)   Ht '5\' 11"'$  (1.803 m)   Wt 161 lb (73 kg)   SpO2 94%   BMI 22.45 kg/m  GEN: NAD EYE: Sclerae anicteric ENT: MMM CV: Non-tachycardic Pulm: No increased WOB GI: Soft NEURO:  Alert & Oriented   [name], MD [facility]   09/05/2022 9:52 AM

## 2022-09-05 NOTE — Op Note (Signed)
[name] Patient Name: Jacob Davila Procedure Date: 09/05/2022 9:52 AM MRN: [id number] Endoscopist: Sonny Masters "[name] ,  Age: 46 Referring MD:  Date of Birth: Apr 12, 1976 Gender: Male Account #: [id number] Procedure:                Upper GI endoscopy Indications:              Follow-up of gastric ulcer Medicines:                Monitored Anesthesia Care Procedure:                Pre-Anesthesia Assessment:                           - Prior to the procedure, a History and Physical                            was performed, and patient medications and                            allergies were reviewed. The patient's tolerance of                            previous anesthesia was also reviewed. The risks                            and benefits of the procedure and the sedation                            options and risks were discussed with the patient.                            All questions were answered, and informed consent                            was obtained. Prior Anticoagulants: The patient has                            taken no previous anticoagulant or antiplatelet                            agents. ASA Grade Assessment: II - A patient with                            mild systemic disease. After reviewing the risks                            and benefits, the patient was deemed in                            satisfactory condition to undergo the procedure.                           After obtaining informed consent, the endoscope was  passed under direct vision. Throughout the                            procedure, the patient's blood pressure, pulse, and                            oxygen saturations were monitored continuously. The                            Endoscope was introduced through the mouth, and                            advanced to the second part of duodenum. The upper                            GI endoscopy was  accomplished without difficulty.                            The patient tolerated the procedure well. Scope In: Scope Out: Findings:                 Salmon-colored mucosa was present. The maximum                            longitudinal extent of these esophageal mucosal                            changes was 1 cm in length. Mucosa was biopsied                            with a cold forceps for histology. One specimen                            bottle was sent to pathology.                           Localized mildly erythematous mucosa without                            bleeding was found in the gastric antrum.                           The examined duodenum was normal. Complications:            No immediate complications. Estimated Blood Loss:     Estimated blood loss was minimal. Impression:               - Salmon-colored mucosa suspicious for                            short-segment Barrett's esophagus. Biopsied.                           - Erythematous mucosa in the antrum.                           -  Normal examined duodenum. Recommendation:           - Discharge patient to home (with escort).                           - Await pathology results.                           - Return to GI clinic in 4 weeks.                           - The findings and recommendations were discussed                            with the patient. Dr Georgian Co "Lyndee Leo" [name],  09/05/2022 10:19:19 AM

## 2022-09-05 NOTE — Progress Notes (Signed)
At 1032, writer observed patient having jerking motions. All four extremities were jerking. Patient did not respond to verbal commands during jerking. O2 at 3LPM applied.  1032: CRNA and Dr [name] arrived to recovery. 1035 Patient awake and alert; voicing that he's "freezing". Two warm blankets applied. CRNA and Dr [name] at bedside. Patient to be transferred via EMS to ED. Sister, Lattie Haw notified by Dr [name].  1100 Patient left [facility] via EMS.

## 2022-09-05 NOTE — Progress Notes (Signed)
Called to room to assist during endoscopic procedure.  Patient ID and intended procedure confirmed with present staff. Received instructions for my participation in the procedure from the performing physician.  

## 2022-09-05 NOTE — ED Triage Notes (Signed)
Pt BIB EMS from [facility]. Post-procedure pt had a 2-3 minute generalized seizure with staff. 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   EMS Vitals 86 CBG 152/94 86 HR

## 2022-09-05 NOTE — ED Notes (Signed)
Discharge instructions reviewed with patient and sister. Patient and sister deny any questions or concerns. Patient out to lobby via wheelchair.

## 2022-09-05 NOTE — Progress Notes (Signed)
Report given to PACU, vss 

## 2022-09-06 ENCOUNTER — Encounter: Payer: Self-pay | Admitting: Internal Medicine

## 2022-09-06 ENCOUNTER — Telehealth: Payer: Self-pay

## 2022-09-06 NOTE — Telephone Encounter (Signed)
  Follow up Call-     09/05/2022    9:21 AM 02/21/2022   10:25 AM  Call back number  Post procedure Call Back phone  # [phone]  Permission to leave phone message Yes Yes     Patient questions:  Do you have a fever, pain , or abdominal swelling? No. Pain Score  0 *  Have you tolerated food without any problems? Yes.    Have you been able to return to your normal activities? Yes.    Do you have any questions about your discharge instructions: Diet   No. Medications  No. Follow up visit  No.  Do you have questions or concerns about your Care? No.  Actions: * If pain score is 4 or above: No action needed, pain <4.

## 2022-09-06 NOTE — Telephone Encounter (Signed)
Called the patient to see what questions he may have. He was transferred to the ED after his EGD due to muscle jerking that could have been concerning for seizure activity. He was monitored for a period of time in the ED and then discharged to follow up with his PCP. He was given some magnesium to help with low magnesium levels. Patient wanted to know what the next steps would be. I recommended that he schedule a follow up with his neurologist Dr. Posey Pronto to discuss the event and to see what could have caused his symptoms. Patient does state that he has muscle contractions in his legs sometimes and that he is sometimes sleep deprived due to the contractions. He will follow up with his PCP and his neurologist. I told him that it would be fine to return to work since it seems he has had a full resolution of his neurologic symptoms. No recurrent muscle jerking.   Beth, let's get him in for a follow up appointment with me in 1 month as well. Thanks.

## 2022-09-07 ENCOUNTER — Encounter: Payer: Self-pay | Admitting: Internal Medicine

## 2022-09-08 ENCOUNTER — Ambulatory Visit (INDEPENDENT_AMBULATORY_CARE_PROVIDER_SITE_OTHER): Payer: BC Managed Care – PPO | Admitting: Family Medicine

## 2022-09-08 DIAGNOSIS — R569 Unspecified convulsions: Secondary | ICD-10-CM | POA: Diagnosis not present

## 2022-09-08 NOTE — Progress Notes (Signed)
    Procedures performed today:    None.  Independent interpretation of notes and tests performed by another provider:   None.  Brief History, Exam, Impression, and Recommendations:    BP (!) 78/55   Pulse 93   Ht '5\' 11"'$  (1.803 m)   Wt 159 lb 11.2 oz (72.4 kg)   SpO2 98%   BMI 22.27 kg/m   Seizure-like activity (HCC) Patient had recent visit to the emergency department earlier this week following endoscopy.  While in the recovery area after his endoscopy, he was feeling drowsy and then suddenly remembers being surrounded by multiple people.  He is unsure of exactly what happened in the interim but reportedly had some shaking/muscle jerking.  He did not experience any tongue biting or bowel or bladder incontinence.  Reportedly, patient was having tremors of his arms, legs and torso and according to GI documentation, after prompting patient was able to open his eyes and respond during the episode. Today, he has been doing well since discharge from the ED. He also indicates that when he was in the ED, he had further "shaking but not to the degree of after his procedure" - during this he remained conscious and could generally recall episode at the time. No issues with seizures as an adult, does indicate having seizure as a toddler. On exam, patient is in no acute distress, BP is slightly lower than patient's baseline with [place] in the office, pulse stable. Cardiovascular exam with regular rate and rhythm, lungs clear to auscultation bilaterally. At this time, feel that symptoms are less likely related to seizure, however it is within differential and further evaluation would be prudent. He does have appointment upcoming with GI specialist. He has seen neurology in the past, Dr. Posey Pronto - this was related to leg weakness in the past. Recommend that he schedule follow-up visit with Neurology for further evaluation and recommendations.  Spent 33 minutes on this patient encounter, including  preparation, chart review, face-to-face counseling with patient and coordination of care, and documentation of encounter  Return in about 6 weeks (around 10/20/2022) for CPE.   ___________________________________________ Tiki Tucciarone de [place], MD, ABFM, CAQSM [facility] and [place]

## 2022-09-08 NOTE — Patient Instructions (Signed)

## 2022-09-08 NOTE — Assessment & Plan Note (Addendum)
Patient had recent visit to the emergency department earlier this week following endoscopy.  While in the recovery area after his endoscopy, he was feeling drowsy and then suddenly remembers being surrounded by multiple people.  He is unsure of exactly what happened in the interim but reportedly had some shaking/muscle jerking.  He did not experience any tongue biting or bowel or bladder incontinence.  Reportedly, patient was having tremors of his arms, legs and torso and according to GI documentation, after prompting patient was able to open his eyes and respond during the episode. Today, he has been doing well since discharge from the ED. He also indicates that when he was in the ED, he had further "shaking but not to the degree of after his procedure" - during this he remained conscious and could generally recall episode at the time. No issues with seizures as an adult, does indicate having seizure as a toddler. On exam, patient is in no acute distress, BP is slightly lower than patient's baseline with [place] in the office, pulse stable. Cardiovascular exam with regular rate and rhythm, lungs clear to auscultation bilaterally. At this time, feel that symptoms are less likely related to seizure, however it is within differential and further evaluation would be prudent. He does have appointment upcoming with GI specialist. He has seen neurology in the past, Dr. Posey Pronto - this was related to leg weakness in the past. Recommend that he schedule follow-up visit with Neurology for further evaluation and recommendations.

## 2022-10-20 ENCOUNTER — Encounter (HOSPITAL_BASED_OUTPATIENT_CLINIC_OR_DEPARTMENT_OTHER): Payer: Self-pay

## 2022-10-26 ENCOUNTER — Encounter (HOSPITAL_BASED_OUTPATIENT_CLINIC_OR_DEPARTMENT_OTHER): Payer: Self-pay

## 2022-10-26 ENCOUNTER — Encounter (HOSPITAL_BASED_OUTPATIENT_CLINIC_OR_DEPARTMENT_OTHER): Payer: Self-pay | Admitting: Family Medicine

## 2022-10-26 ENCOUNTER — Ambulatory Visit (INDEPENDENT_AMBULATORY_CARE_PROVIDER_SITE_OTHER): Payer: Self-pay | Admitting: Family Medicine

## 2022-10-26 VITALS — BP 113/76 | HR 77 | Temp 97.8°F | Ht 71.0 in | Wt 158.8 lb

## 2022-10-26 DIAGNOSIS — R0981 Nasal congestion: Secondary | ICD-10-CM

## 2022-10-26 DIAGNOSIS — J069 Acute upper respiratory infection, unspecified: Secondary | ICD-10-CM

## 2022-10-26 LAB — POCT INFLUENZA A/B
Influenza A, POC: NEGATIVE
Influenza B, POC: NEGATIVE

## 2022-10-26 LAB — POC SOFIA SARS ANTIGEN FIA: SARS Coronavirus 2 Ag: NEGATIVE

## 2022-10-26 NOTE — Assessment & Plan Note (Signed)
Patient reports that about 4 days ago he began to experience cough, sinus congestion, runny nose.  He has not had any fevers, however has had some chills.  He has been utilizing over-the-counter medications to help with symptoms.  He does have it with him in the office today and it appears to be a combination of acetaminophen, phenylephrine.  He has been having some chest wall pain related to his coughing.  He has not completed any other evaluations of his home COVID test.  He is unaware of any sick contacts. On exam, patient is in no acute distress, vital signs stable, patient is afebrile.  Cardiovascular exam with regular rate and rhythm.  Lungs with occasional crackles bilaterally.  No significant tenderness to palpation over sinuses.  No appreciable cervical lymphadenopathy. Suspect acute viral illness.  Swabs for flu and COVID completed today, both of which were negative.  Discussed that this is likely an unspecified viral illness and recommend symptomatic management at this time.  Can continue with over-the-counter measures, discussed use of honey as useful intervention.  Unfortunately, he has reported allergy to benzonatate and thus cannot utilize this at this time.  For some of his chest wall discomfort, can utilize NSAID Discussed prognosis and that symptoms likely should improve over the next 3 to 5 days.  Did discuss that cough can sometimes linger with presence of postviral cough up to 4 to 6 weeks after illness

## 2022-10-26 NOTE — Progress Notes (Signed)
    Procedures performed today:    None.  Independent interpretation of notes and tests performed by another provider:   None.  Brief History, Exam, Impression, and Recommendations:    BP 113/76 (BP Location: Left Arm, Patient Position: Sitting, Cuff Size: Large)   Pulse 77   Temp 97.8 F (36.6 C) (Oral)   Ht '5\' 11"'$  (1.803 m)   Wt 158 lb 12.8 oz (72 kg)   SpO2 97%   BMI 22.15 kg/m   URI (upper respiratory infection) Patient reports that about 4 days ago he began to experience cough, sinus congestion, runny nose.  He has not had any fevers, however has had some chills.  He has been utilizing over-the-counter medications to help with symptoms.  He does have it with him in the office today and it appears to be a combination of acetaminophen, phenylephrine.  He has been having some chest wall pain related to his coughing.  He has not completed any other evaluations of his home COVID test.  He is unaware of any sick contacts. On exam, patient is in no acute distress, vital signs stable, patient is afebrile.  Cardiovascular exam with regular rate and rhythm.  Lungs with occasional crackles bilaterally.  No significant tenderness to palpation over sinuses.  No appreciable cervical lymphadenopathy. Suspect acute viral illness.  Swabs for flu and COVID completed today, both of which were negative.  Discussed that this is likely an unspecified viral illness and recommend symptomatic management at this time.  Can continue with over-the-counter measures, discussed use of honey as useful intervention.  Unfortunately, he has reported allergy to benzonatate and thus cannot utilize this at this time.  For some of his chest wall discomfort, can utilize NSAID Discussed prognosis and that symptoms likely should improve over the next 3 to 5 days.  Did discuss that cough can sometimes linger with presence of postviral cough up to 4 to 6 weeks after illness . Return if symptoms worsen or fail to  improve.   ___________________________________________ Dora Simeone de [place], MD, ABFM, [facility] and [place]

## 2022-10-26 NOTE — Patient Instructions (Signed)

## 2022-12-06 IMAGING — US US ABDOMEN LIMITED
1 series · 14 of 25 positions shown · non-contrast
Comparison: None.

CLINICAL DATA: Transaminitis.

EXAM:
ULTRASOUND ABDOMEN LIMITED RIGHT UPPER QUADRANT

[Series 1: us abdomen limited · 0.18mm/px · 14 of 69 slices shown]
[im 1/69]
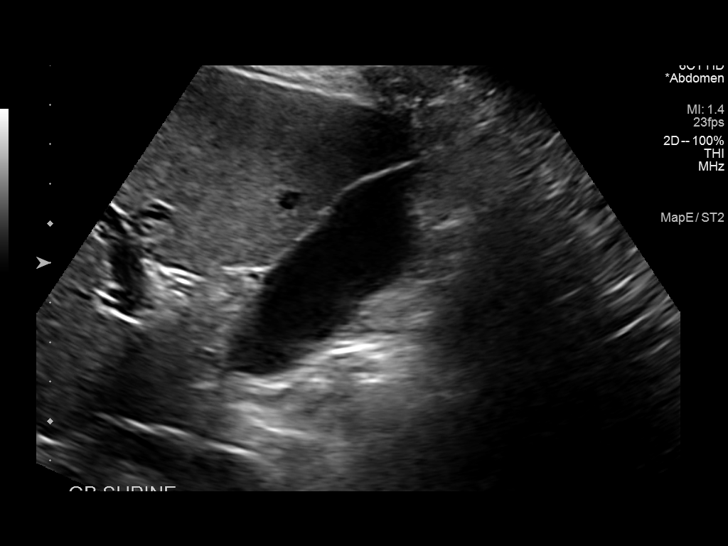
[im 6/69]
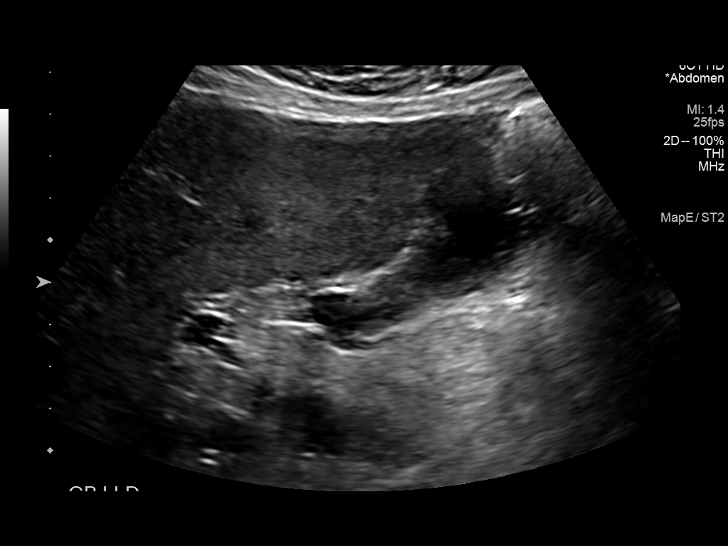
[im 12/69]
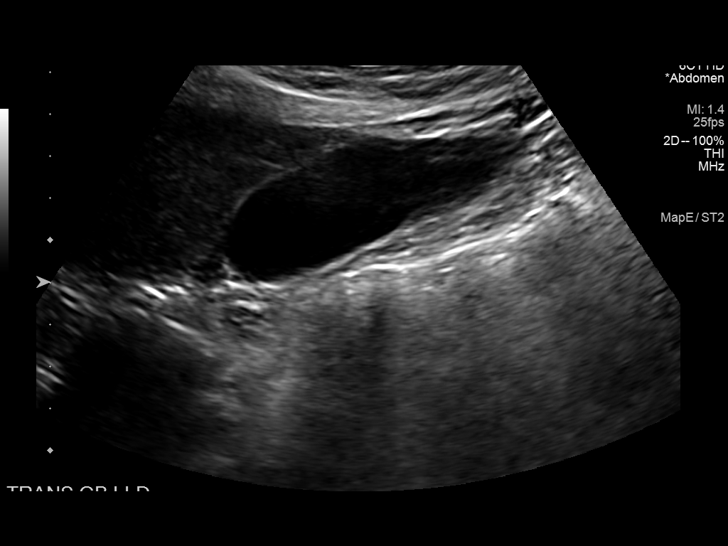
[im 18/69]
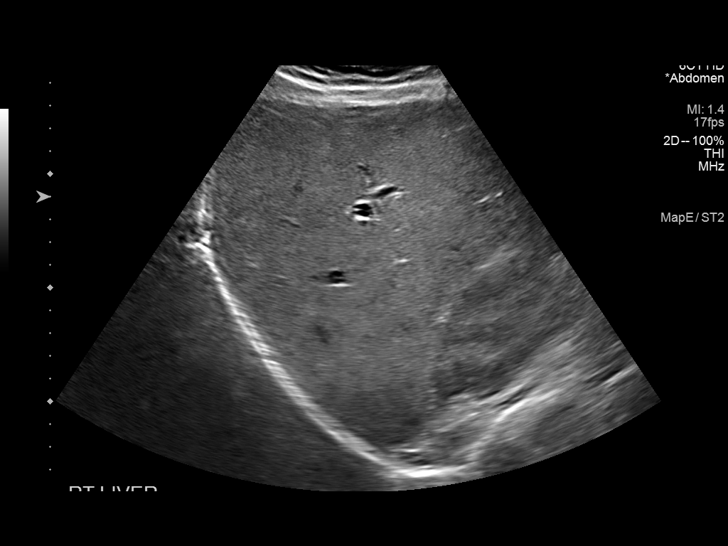
[im 23/69]
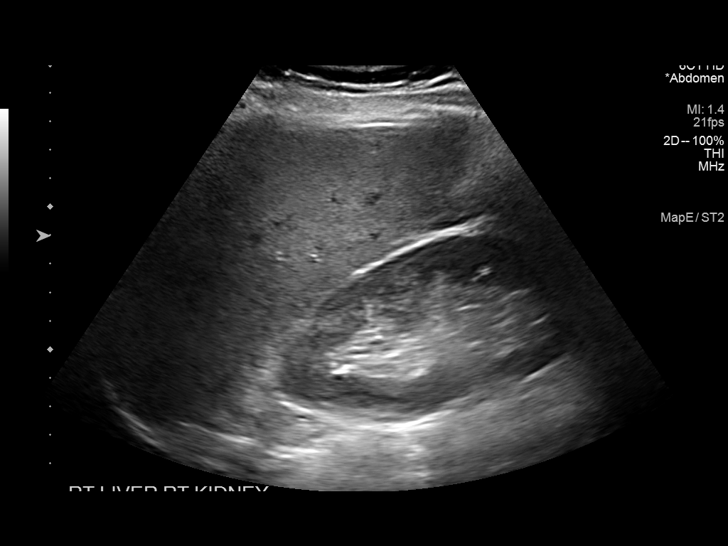
[im 26/69]
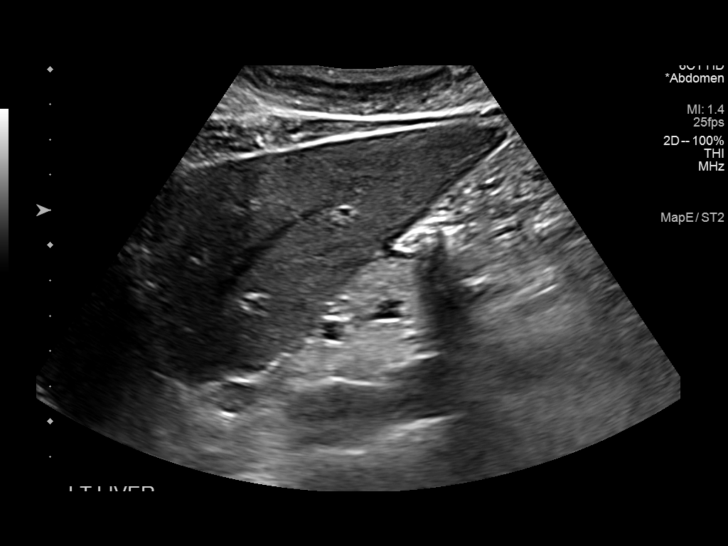
[im 32/69]
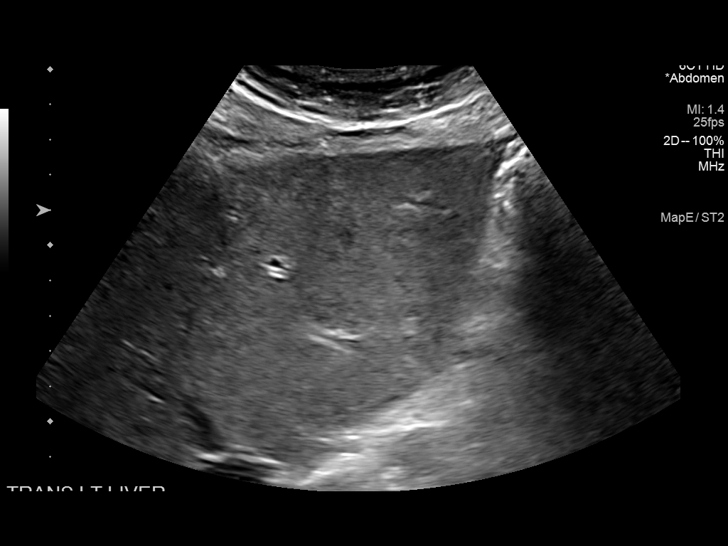
[im 37/69]
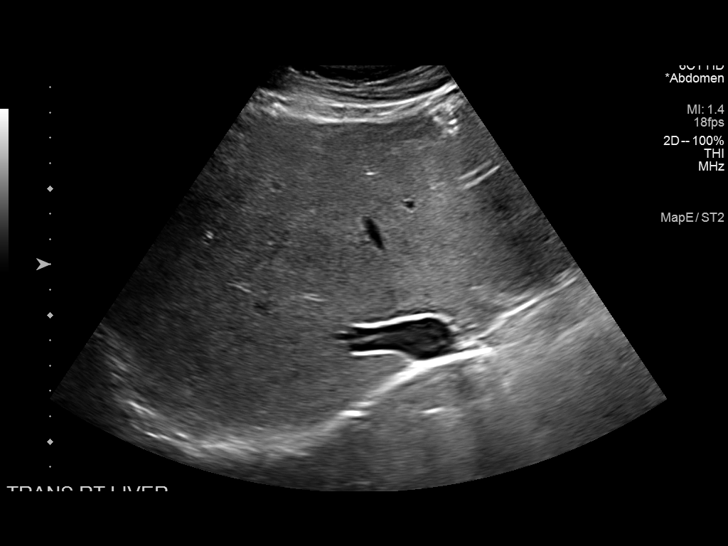
[im 43/69]
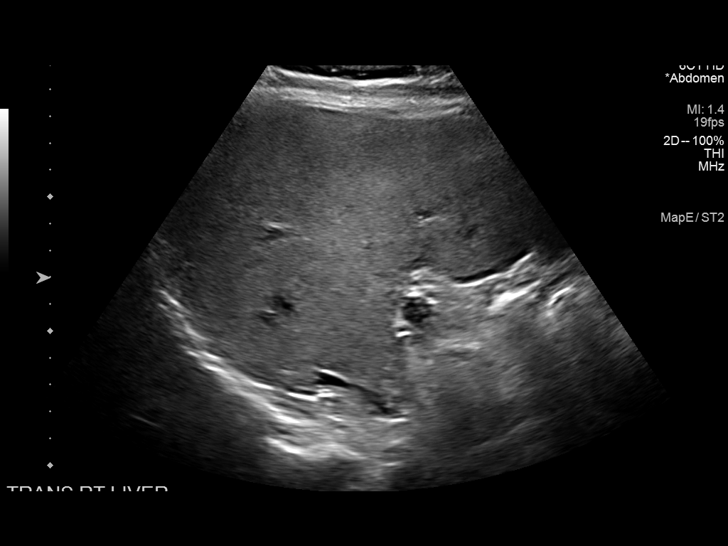
[im 46/69]
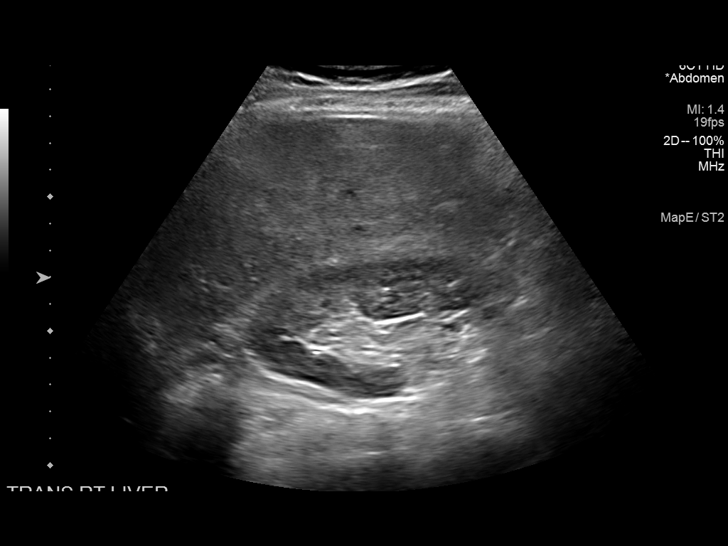
[im 52/69]
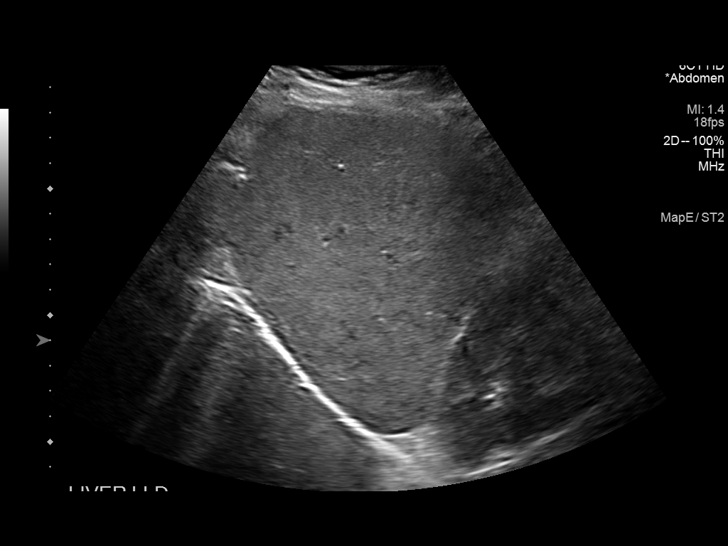
[im 57/69]
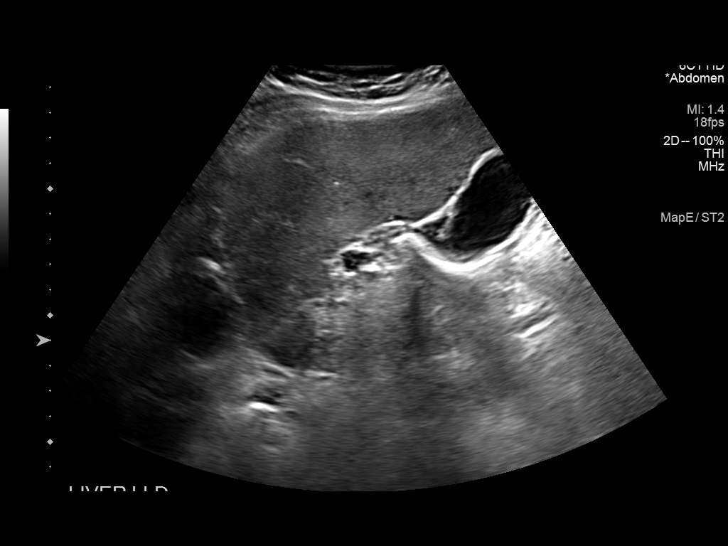
[im 63/69]
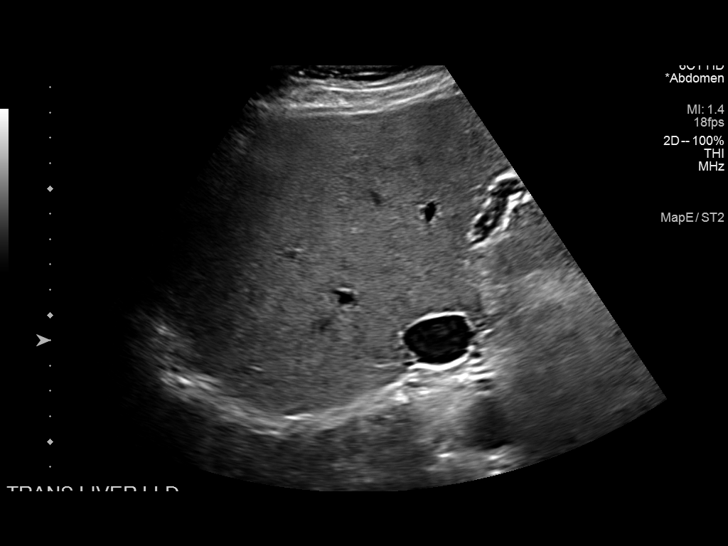
[im 69/69]
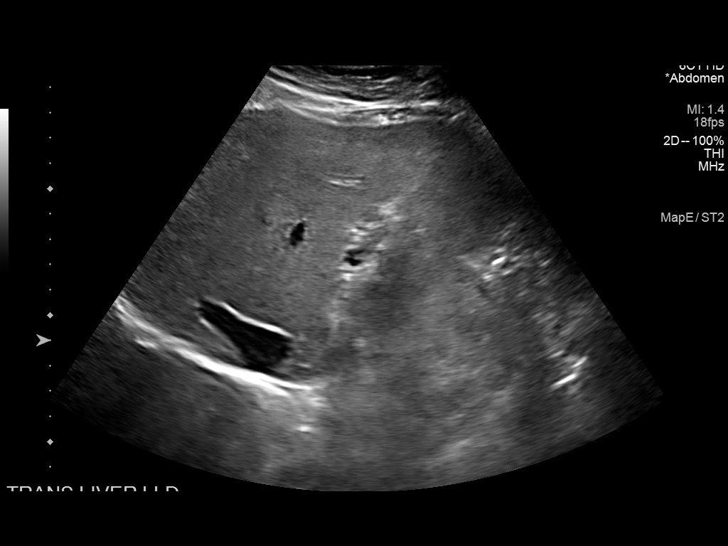

[14 of 25 positions shown; findings below may reference images not displayed]

FINDINGS: Gallbladder:

No gallstones or wall thickening visualized. No sonographic Murphy
sign noted by sonographer.

Common bile duct:

Diameter: 3.4 mm

Liver:

Mild increased echogenicity with no focal mass. Portal vein is
patent on color Doppler imaging with normal direction of blood flow
towards the liver.

Other: None.
IMPRESSION: 1. Mild increased echogenicity in the liver without focal mass is
nonspecific but often due to hepatic steatosis. No other
abnormalities.

## 2022-12-09 ENCOUNTER — Other Ambulatory Visit: Payer: Self-pay | Admitting: Cardiology

## 2022-12-09 ENCOUNTER — Other Ambulatory Visit: Payer: Self-pay | Admitting: *Deleted

## 2022-12-09 MED ORDER — METOPROLOL SUCCINATE ER 25 MG PO TB24: 25.0000 mg | ORAL_TABLET | Freq: Every day | ORAL | 2 refills | Status: DC

## 2022-12-09 NOTE — Progress Notes (Signed)
Patient came to the office needing refill for Metoprolol succinate  Refill 90 days x 2 Patient is aware he needs an  annual appointment  ( recall is 2/24)  next available appointment possible May, June or July

## 2022-12-13 NOTE — Telephone Encounter (Signed)
Called and lft message on Vmail.  Jacob Davila sch not open yet and have put patient on Waitlist.  Should he need med refill, he should leave message in [place] and tell them he is on waitlist for appt.  12-13-22 VB

## 2022-12-29 ENCOUNTER — Telehealth (HOSPITAL_BASED_OUTPATIENT_CLINIC_OR_DEPARTMENT_OTHER): Payer: Self-pay | Admitting: Family Medicine

## 2022-12-29 NOTE — Telephone Encounter (Signed)
Pt Advised NO to the FLU Vaccine  

## 2023-02-28 IMAGING — CR DG LUMBAR SPINE 2-3V
2 series · 2 of 2 positions shown · non-contrast
Comparison: None.

CLINICAL DATA: Left leg weakness, hyper reflexia.

EXAM:
LUMBAR SPINE - 2-3 VIEW

[w l-spine a.p. *]
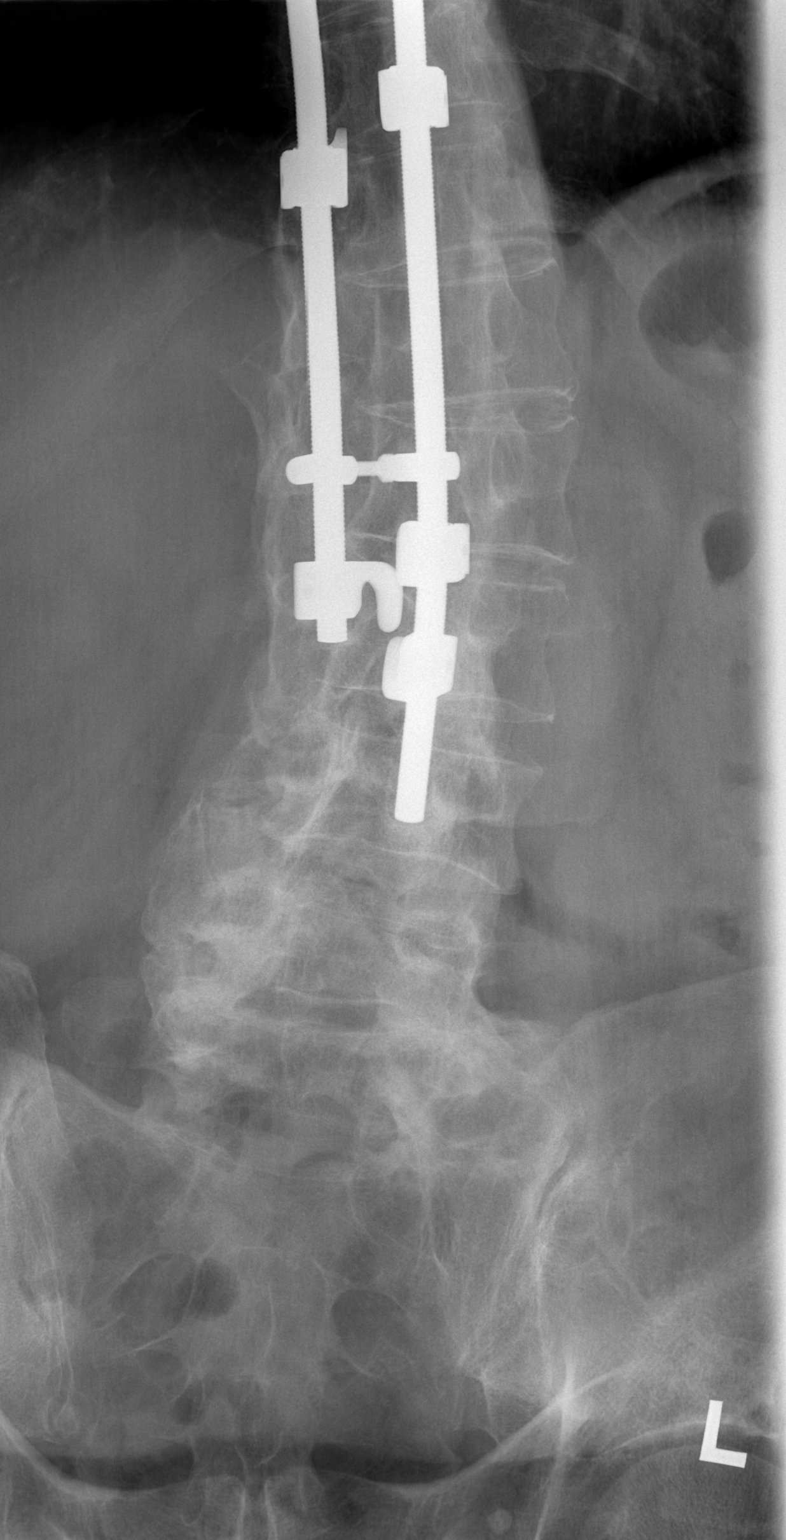

[w l-spine lat *]
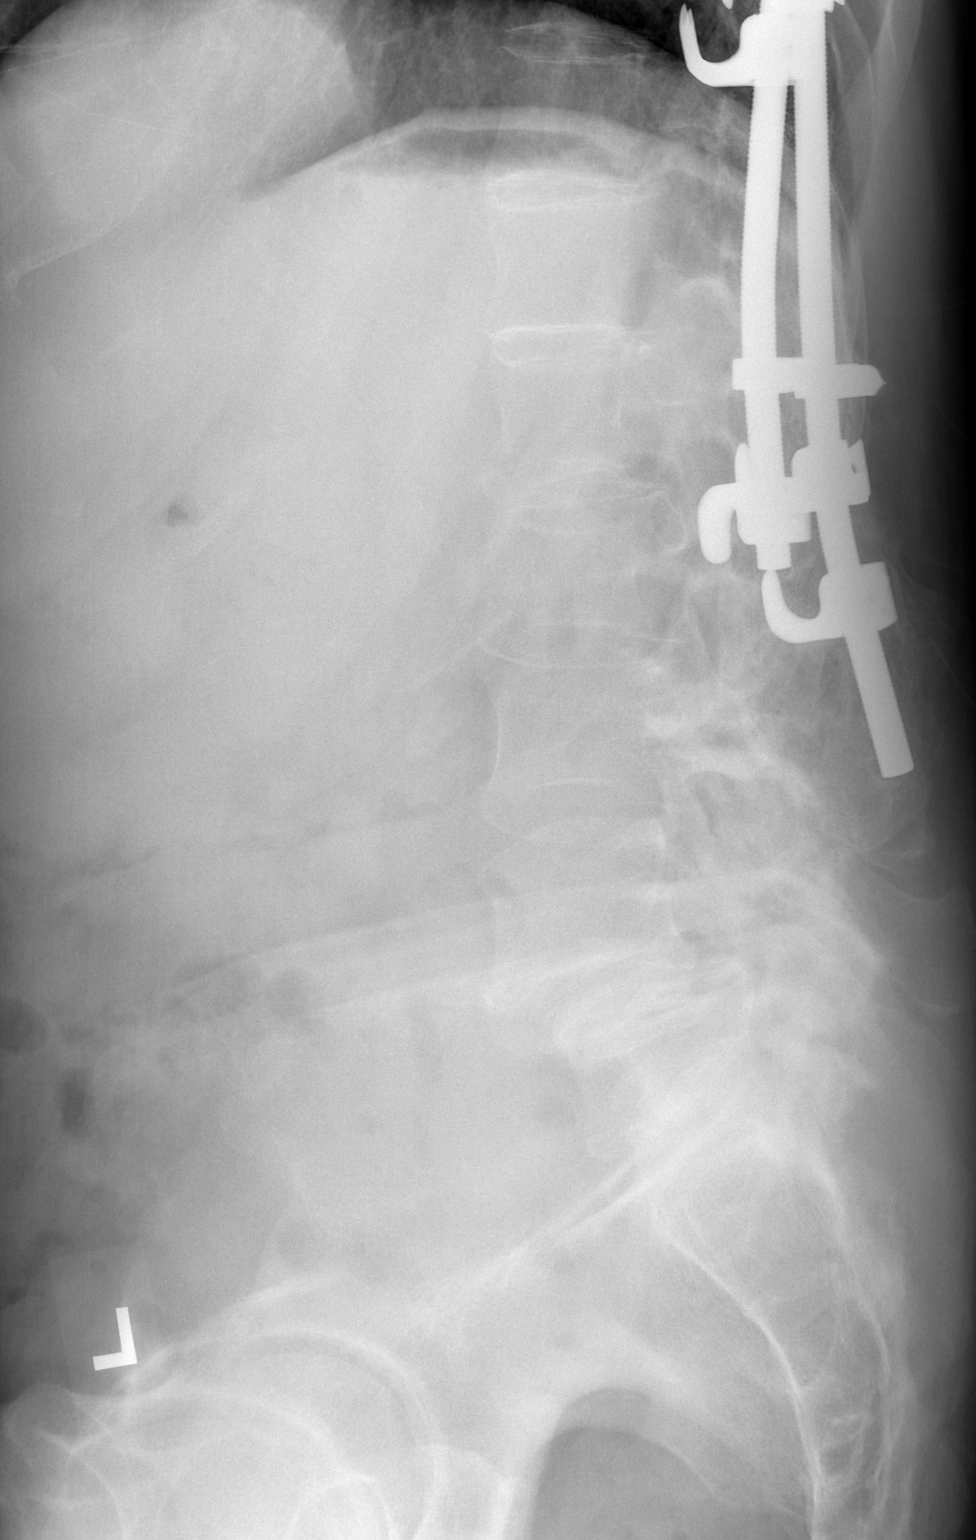

[2 of 2 positions shown; findings below may reference images not displayed]

FINDINGS: Partially visualized spinal stabilization hardware over the lower
thoracic and lumbar spine intact. Bony fusion mass over the lumbar
spine. Mild curvature of the visualized thoracolumbar spine convex
left. Vertebral body heights are maintained. Mild spondylosis of the
lumbar spine to include facet arthropathy. Grade 1 anterolisthesis
of L4 on L5. Disc space narrowing at the L4-5 and L5-S1 levels.
IMPRESSION: Mild spondylosis of the lumbar spine with disc disease at the L4-5
and L5-S1 levels. Grade 1 anterolisthesis of L4 on L5. Spinal
stabilization hardware partially visualized and intact.

## 2023-04-24 ENCOUNTER — Ambulatory Visit: Payer: Self-pay | Admitting: Cardiology

## 2023-05-23 ENCOUNTER — Telehealth: Payer: Self-pay | Admitting: Cardiology

## 2023-05-23 ENCOUNTER — Telehealth: Payer: Self-pay

## 2023-05-23 ENCOUNTER — Telehealth (HOSPITAL_BASED_OUTPATIENT_CLINIC_OR_DEPARTMENT_OTHER): Payer: Self-pay | Admitting: Family Medicine

## 2023-05-23 NOTE — Telephone Encounter (Signed)
Patient called in to schedule an appt for swelling and to loop in Dr. De [place] on what is going on with him. I called the patient back and left a voicemail to schedule and we have openings today and this week.

## 2023-05-23 NOTE — Telephone Encounter (Signed)
Pt walked into clinic, filled out triage form, form given to triage RN. Pt stated that he was having upper body and leg swelling, and rapid HR when walking or standing for the past few days. He compared it to how he felt before his ablation.  Reviewed pt's chart, returned call for clarification, two identifiers used. Pt stated that he initially thought it was gas or constipation due to the abdominal swelling/bloating. He has been going to the bathroom with no issues. He helped his parents with their groceries a few days ago and his HR was "through the roof". When asked what it was, he said it was 97. He states that he knows that WNL, but for him it is high. He has been wearing compression socks. He states that elevation makes the swelling resolve. Agreed with pt that he should contact his PCP to r/o anything else that might be going on. Offered an appt, but will call back after talking to PCP. Confirmed understanding.

## 2023-05-23 NOTE — Telephone Encounter (Signed)
Pt c/o swelling: STAT is pt has developed SOB within 24 hours  If swelling, where is the swelling located?  Patient sats his entire body swelling  How much weight have you gained and in what time span?   Have you gained 3 pounds in a day or 5 pounds in a week?   Do you have a log of your daily weights (if so, list)?   Are you currently taking a fluid pill?  no  Are you currently SOB?  Mot really, heart rate getting up above 100 , also no appetite  Have you traveled recently?  Patient says he need to talk to Dr Herbie Baltimore or his nurse. He wants to see if Dr Herbie Baltimore wants to see him or send him to a specialist

## 2023-05-23 NOTE — Telephone Encounter (Signed)
Spoke to the pt, he is currently experiencing  BLE and abdomen swelling for the past 3-4 days. Pt does not monitor his weight, however, he does wear compression stockings and elevate his legs.   Advised pt to go to the Emergency Department pt replied " I don't need to go the emergency room, because I am having regular bowel movement and the last time I went to the emergency room there was nothing they could do for me".  Advised pt  to contact Dr. Edilia Bo, explained ED precautions and pt voiced understanding. Will forward to MD and nurse for advise.

## 2023-05-26 ENCOUNTER — Encounter (HOSPITAL_BASED_OUTPATIENT_CLINIC_OR_DEPARTMENT_OTHER): Payer: Self-pay | Admitting: Family Medicine

## 2023-05-26 ENCOUNTER — Ambulatory Visit (INDEPENDENT_AMBULATORY_CARE_PROVIDER_SITE_OTHER): Payer: Medicaid Other | Admitting: Family Medicine

## 2023-05-26 VITALS — BP 106/70 | HR 76 | Ht 71.0 in | Wt 165.5 lb

## 2023-05-26 DIAGNOSIS — K117 Disturbances of salivary secretion: Secondary | ICD-10-CM

## 2023-05-26 DIAGNOSIS — R1900 Intra-abdominal and pelvic swelling, mass and lump, unspecified site: Secondary | ICD-10-CM | POA: Diagnosis not present

## 2023-05-26 DIAGNOSIS — K760 Fatty (change of) liver, not elsewhere classified: Secondary | ICD-10-CM

## 2023-05-26 LAB — POCT URINALYSIS DIP (CLINITEK)
Bilirubin, UA: NEGATIVE
Blood, UA: NEGATIVE
Glucose, UA: NEGATIVE mg/dL
Ketones, POC UA: NEGATIVE mg/dL
Leukocytes, UA: NEGATIVE
Nitrite, UA: NEGATIVE
POC PROTEIN,UA: NEGATIVE
Spec Grav, UA: <=1.005 — AB (ref 1.010–1.025)
Urobilinogen, UA: 2 E.U./dL — AB
pH, UA: 6.5 (ref 5.0–8.0)

## 2023-05-26 NOTE — Patient Instructions (Signed)
DRY MOUTH: Prevention of dry mouth: - Maintenance of good hydration by taking regular sips of water, drinking sugar-free liquids, and avoidance of oral irritants (eg, coffee, alcohol, and nicotine). - Avoidance of low-humidity environments, such as air-conditioned stores, centrally heated houses, and airplanes; and the use of humidifiers to maintain adequate humidity, particularly at night. - Biotene Mouth Wash   All patients with dry mouth should use mechanical and gustatory stimulants.  Salivary flow may be stimulated in patients with residual salivary function by sucking on sugar-free candies and lozenges and by using sugar-free chewing gums. Examples of salivary stimulants include: ?Sugar-free hard candies or lozenges. ?Sugar-free chewing gums containing various sweeteners such as aspartame, saccharin, and sorbitol, which can increase saliva production in patients with residual secretory capacity.  ?Xylitol-containing gum or candy, which may also reduce the cariogenicity of the oral bacterial flora.  ?Topically applied oral inserts (eg, Xylimelt, Salese lozenges) that adhere to the mucosa. ?Citrus-flavored sugarless tablets or oral drops, which may also contain malic acid. This acid, which is normally found in fruits such as apples or pears, stimulates salivary flow. ?Dried fruit slices, such as peaches or nectarines, which stimulate salivary flow.

## 2023-05-26 NOTE — Progress Notes (Signed)
Subjective:   Jacob Davila Mar 24, 1976 05/26/2023  Chief Complaint  Patient presents with   Leg Swelling    Pt has been having swelling in legs as well as abdomen. States he has also had pain associated in his waist area. Also states he has noticed some discoloration in urine which has been going on for the past week.    HPI: Jacob Davila presents today for re-assessment and management of chronic medical conditions.  Patient reports abdominal swelling and bilateral leg swelling x 1 week. States his abdomen is painful to LLQ and RLQ with movement. He states he feels "shaky" and cannot sleep. He reports his heart rate has been in the upper 90's this past week which "is high for him". Reports vein ablation to LLE in may 2023. Reports swelling to BLE resolves with elevation. Wears compression socks regularly.  Denies regular tobacco use. Denies alcohol use. He states he has increased his fluid intake to assist with dry mouth symptoms.   Description of pain: Aching Radiation: Radiates to back Alleviating factors: OTC medicaiton (tylenol) Aggravating factors: Movement, Exertion and Standing Treatments tried: Tylenol   Fever: no Nausea: no Vomiting: no Weight loss: Yes Decreased appetite: no Diarrhea: no Constipation: no Blood in stool: no Heartburn: no Dysuria/urinary frequency: no Hematuria: no History of sexually transmitted disease: no Recurrent NSAID use: no  Wt Readings from Last 3 Encounters:  05/26/23 165 lb 8 oz (75.1 kg)  10/26/22 158 lb 12.8 oz (72 kg)  09/08/22 159 lb 11.2 oz (72.4 kg)       The following portions of the patient's history were reviewed and updated as appropriate: past medical history, past surgical history, family history, social history, allergies, medications, and problem list.   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  Venous reflux noted in the right GSV in the calf.  Venous reflux noted in the left common femoral vein and left greater saphenous vein in the thigh.  Left GSV in the calf as well as a left perforator vein. => Forwarded to vascular surgery:   Past Surgical History:  Procedure Laterality Date   BACK SURGERY  03/1990   Scoliosis   COLONOSCOPY     ENDOVENOUS ABLATION SAPHENOUS VEIN W/ LASER Left 04/20/2022   endovenous laser ablation left greater saphenous vein and stab phlebectomy 10-20 incisions left leg by Cari Caraway MD   TRANSTHORACIC ECHOCARDIOGRAM  06/08/2019   EF 60-65%.  Normal wall motion.  Normal relaxation.  Mild aortic valve thickening but no sclerosis/stenosis.  Normal echo   UPPER GASTROINTESTINAL ENDOSCOPY  02/21/2022   Leonides Schanz   Family History  Problem Relation Age of Onset   Atrial fibrillation Mother    Heart failure Mother  Hypertension Father    Hyperlipidemia Father    Colon cancer Maternal Aunt    Colon cancer Paternal Uncle    CAD Paternal Grandfather        noted to have Angina (not aware of details)   Stomach cancer Neg Hx    Esophageal cancer Neg Hx    Rectal cancer Neg Hx    Pancreatic cancer Neg Hx    Outpatient Medications Prior to Visit  Medication Sig Dispense Refill   acetaminophen (TYLENOL) 500 MG tablet Take 500-1,000 mg  by mouth every 6 (six) hours as needed for moderate pain.     metoprolol succinate (TOPROL XL) 25 MG 24 hr tablet Take 1 tablet (25 mg total) by mouth at bedtime. 90 tablet 2   Multiple Vitamin (MULTIVITAMIN) tablet Take 1 tablet by mouth daily.     omeprazole (PRILOSEC) 40 MG capsule Take 1 capsule (40 mg total) by mouth in the morning and at bedtime. 112 capsule 0   hydrocortisone (ANUSOL-HC) 25 MG suppository Place 1 suppository (25 mg total) rectally 2 (two) times daily. 14 suppository 0   Facility-Administered Medications Prior to Visit  Medication Dose Route Frequency Provider Last Rate Last Admin   0.9 %  sodium chloride infusion  500 mL Intravenous Once Imogene Burn, MD       Allergies  Allergen Reactions   Allegra [Fexofenadine] Other (See Comments)    Blood rushes out of head and faints   Darvon [Propoxyphene] Itching    Made skin feel like it was crawling    Tessalon [Benzonatate] Itching    Made skin feel like it was crawling    Latex Dermatitis and Rash   Morphine And Codeine Rash     ROS: A complete ROS was performed with pertinent positives/negatives noted in the HPI. The remainder of the ROS are negative.    Objective:   Today's Vitals   05/26/23 0902  BP: 106/70  Pulse: 76  SpO2: 98%  Weight: 165 lb 8 oz (75.1 kg)  Height: 5\' 11"  (1.803 m)    GENERAL: Well-appearing, in NAD. Well nourished.  SKIN: Pink, warm and dry. No rash, lesion, ulceration, or ecchymoses.  Head: Normocephalic. NECK: Trachea midline. Full ROM w/o pain or tenderness. No lymphadenopathy.  RESPIRATORY: Chest wall symmetrical. Respirations even and non-labored. Breath sounds clear to auscultation bilaterally.  CARDIAC: S1, S2 present, regular rate and rhythm without murmur or gallops. Peripheral pulses 2+ bilaterally.  GI: Abdomen soft, non-tender. Mild healing ecchymosis to right lower quadrant (not to umbilicus) from reported mechanical trauma per patient.  Normoactive bowel sounds. No  rebound tenderness. No hepatomegaly or splenomegaly. No CVA tenderness. No fluid wave.  MSK: Muscle tone and strength appropriate for age. Joints w/o tenderness, redness, or swelling.  EXTREMITIES: Without clubbing, cyanosis, or edema. Compression stockings in place to BLE.  NEUROLOGIC: No motor or sensory deficits. Steady, even gait. C2-C12 intact.  PSYCH/MENTAL STATUS: Alert, oriented x 3. Cooperative, appropriate mood and affect.      Health Maintenance Due  Topic Date Due   COVID-19 Vaccine (1) Never done   HIV Screening  Never done   DTaP/Tdap/Td (1 - Tdap) Never done    Results for orders placed or performed in visit on 05/26/23  POCT URINALYSIS DIP ([facility])  Result Value Ref Range   Color, UA yellow yellow   Clarity, UA clear clear   Glucose, UA negative negative mg/dL   Bilirubin, UA negative negative   Ketones, POC UA negative negative  mg/dL   Spec Grav, UA <=1.610 (A) 1.010 - 1.025   Blood, UA negative negative   pH, UA 6.5 5.0 - 8.0   POC PROTEIN,UA negative negative, trace   Urobilinogen, UA 2.0 (A) 0.2 or 1.0 E.U./dL   Nitrite, UA Negative Negative   Leukocytes, UA Negative Negative    The ASCVD Risk score (Arnett DK, et al., 2019) failed to calculate for the following reasons:   The valid HDL cholesterol range is 20 to 100 mg/dL       Assessment & Plan:  1. Abdominal swelling 2. Hepatic steatosis Concern for progression of hepatic steatosis with weight gain and increased urobilinogen. No swelling present to BLE. No fluid wave or signs of ascites on physical exam. Will obtain the following labs today and US Abdomen Complete. Will follow up in 3 months for AE or sooner pending labs and [place].   Discussed needed dietary changes, avoidance of alcohol with patient and he verbalized understanding. No signs of acute pancreatitis as there was no abdominal tenderness, Cullen's or Grey turner sign, no rebound tenderness, no fever/chills, nausea or vomiting. Urine is  negative for infection. Pt aware to see emergency room if pain worsens, fever/chills, nausea/vomiting/diarrhea occur.   - CBC with Differential - Comprehensive metabolic panel - Brain natriuretic peptide - US Abdomen Complete; Future - Lipase - Amylase - POCT URINALYSIS DIP (CLIN[facility]K)   3. Xerostomia Discussed OTC measures for dry mouth symptoms including sugar free lozenges, frequent clear fluid sips, and Biotene.    Return in about 3 months (around 08/26/2023) for ANNUAL PHYSICAL (Labs Prior fasting ) .   Patient to reach out to office if new, worrisome, or unresolved symptoms arise or if no improvement in patient's condition. Patient verbalized understanding and is agreeable to treatment plan. All questions answered to patient's satisfaction.    Yolanda Manges, FNP

## 2023-05-27 ENCOUNTER — Emergency Department (HOSPITAL_BASED_OUTPATIENT_CLINIC_OR_DEPARTMENT_OTHER)
Admission: EM | Admit: 2023-05-27 | Discharge: 2023-05-27 | Disposition: A | Discharge status: Home / Self Care | Payer: Medicaid Other | Attending: Emergency Medicine | Admitting: Emergency Medicine

## 2023-05-27 ENCOUNTER — Encounter (HOSPITAL_BASED_OUTPATIENT_CLINIC_OR_DEPARTMENT_OTHER): Payer: Self-pay

## 2023-05-27 ENCOUNTER — Emergency Department (HOSPITAL_BASED_OUTPATIENT_CLINIC_OR_DEPARTMENT_OTHER): Payer: Medicaid Other

## 2023-05-27 DIAGNOSIS — M7989 Other specified soft tissue disorders: Secondary | ICD-10-CM | POA: Insufficient documentation

## 2023-05-27 DIAGNOSIS — R748 Abnormal levels of other serum enzymes: Secondary | ICD-10-CM | POA: Diagnosis not present

## 2023-05-27 DIAGNOSIS — Z91040 Latex allergy status: Secondary | ICD-10-CM | POA: Diagnosis not present

## 2023-05-27 DIAGNOSIS — R222 Localized swelling, mass and lump, trunk: Secondary | ICD-10-CM | POA: Insufficient documentation

## 2023-05-27 DIAGNOSIS — R7989 Other specified abnormal findings of blood chemistry: Secondary | ICD-10-CM | POA: Diagnosis present

## 2023-05-27 LAB — COMPREHENSIVE METABOLIC PANEL
ALT: 132 IU/L — ABNORMAL HIGH (ref 0–44)
ALT: 107 U/L — ABNORMAL HIGH (ref 0–44)
AST: 284 IU/L — ABNORMAL HIGH (ref 0–40)
AST: 190 U/L — ABNORMAL HIGH (ref 15–41)
Albumin: 3 g/dL — ABNORMAL LOW (ref 4.1–5.1)
Albumin: 3.4 g/dL — ABNORMAL LOW (ref 3.5–5.0)
Alkaline Phosphatase: 556 IU/L — ABNORMAL HIGH (ref 44–121)
Alkaline Phosphatase: 463 U/L — ABNORMAL HIGH (ref 38–126)
Anion gap: 7 (ref 5–15)
BUN/Creatinine Ratio: 6 — ABNORMAL LOW (ref 9–20)
BUN: 7 mg/dL (ref 6–24)
BUN: 6 mg/dL (ref 6–20)
Bilirubin Total: 4 mg/dL — ABNORMAL HIGH (ref 0.0–1.2)
CO2: 23 mmol/L (ref 20–29)
CO2: 27 mmol/L (ref 22–32)
Calcium: 7.6 mg/dL — ABNORMAL LOW (ref 8.7–10.2)
Calcium: 8.3 mg/dL — ABNORMAL LOW (ref 8.9–10.3)
Chloride: 101 mmol/L (ref 96–106)
Chloride: 101 mmol/L (ref 98–111)
Creatinine, Ser: 1.13 mg/dL (ref 0.76–1.27)
Creatinine, Ser: 0.96 mg/dL (ref 0.61–1.24)
GFR, Estimated: >60 mL/min (ref >60)
Globulin, Total: 2.7 g/dL (ref 1.5–4.5)
Glucose, Bld: 134 mg/dL — ABNORMAL HIGH (ref 70–99)
Glucose: 93 mg/dL (ref 70–99)
Potassium: 3.3 mmol/L — ABNORMAL LOW (ref 3.5–5.2)
Potassium: 3.7 mmol/L (ref 3.5–5.1)
Sodium: 137 mmol/L (ref 134–144)
Sodium: 135 mmol/L (ref 135–145)
Total Bilirubin: 2.8 mg/dL — ABNORMAL HIGH (ref 0.3–1.2)
Total Protein: 5.7 g/dL — ABNORMAL LOW (ref 6.0–8.5)
Total Protein: 6.3 g/dL — ABNORMAL LOW (ref 6.5–8.1)
eGFR: 80 mL/min/{1.73_m2} (ref >59)

## 2023-05-27 LAB — HEPATITIS PANEL, ACUTE
HCV Ab: NONREACTIVE
Hep A IgM: NONREACTIVE
Hep B C IgM: NONREACTIVE
Hepatitis B Surface Ag: NONREACTIVE

## 2023-05-27 LAB — LIPASE: Lipase: 187 U/L — ABNORMAL HIGH (ref 13–78)

## 2023-05-27 LAB — CBC WITH DIFFERENTIAL/PLATELET
Abs Immature Granulocytes: 0.02 10*3/uL (ref 0.00–0.07)
Basophils Absolute: 0 10*3/uL (ref 0.0–0.2)
Basophils Absolute: 0.1 10*3/uL (ref 0.0–0.1)
Basophils Relative: 1 %
Basos: 1 %
EOS (ABSOLUTE): 0.1 10*3/uL (ref 0.0–0.4)
Eos: 2 %
Eosinophils Absolute: 0.1 10*3/uL (ref 0.0–0.5)
Eosinophils Relative: 1 %
HCT: 31.2 % — ABNORMAL LOW (ref 39.0–52.0)
Hematocrit: 31 % — ABNORMAL LOW (ref 37.5–51.0)
Hemoglobin: 10.3 g/dL — ABNORMAL LOW (ref 13.0–17.7)
Hemoglobin: 10.2 g/dL — ABNORMAL LOW (ref 13.0–17.0)
Immature Grans (Abs): 0 10*3/uL (ref 0.0–0.1)
Immature Granulocytes: 0 %
Immature Granulocytes: 0 %
Lymphocytes Absolute: 1.5 10*3/uL (ref 0.7–3.1)
Lymphocytes Relative: 22 %
Lymphs Abs: 1.3 10*3/uL (ref 0.7–4.0)
Lymphs: 32 %
MCH: 35.3 pg — ABNORMAL HIGH (ref 26.6–33.0)
MCH: 36.7 pg — ABNORMAL HIGH (ref 26.0–34.0)
MCHC: 33.2 g/dL (ref 31.5–35.7)
MCHC: 32.7 g/dL (ref 30.0–36.0)
MCV: 106 fL — ABNORMAL HIGH (ref 79–97)
MCV: 112.2 fL — ABNORMAL HIGH (ref 80.0–100.0)
Monocytes Absolute: 0.6 10*3/uL (ref 0.1–0.9)
Monocytes Absolute: 0.8 10*3/uL (ref 0.1–1.0)
Monocytes Relative: 14 %
Monocytes: 12 %
Neutro Abs: 3.8 10*3/uL (ref 1.7–7.7)
Neutrophils Absolute: 2.6 10*3/uL (ref 1.4–7.0)
Neutrophils Relative %: 62 %
Neutrophils: 53 %
Platelets: 83 10*3/uL — CL (ref 150–450)
Platelets: 115 10*3/uL — ABNORMAL LOW (ref 150–400)
RBC: 2.92 x10E6/uL — ABNORMAL LOW (ref 4.14–5.80)
RBC: 2.78 MIL/uL — ABNORMAL LOW (ref 4.22–5.81)
RDW: 13.6 % (ref 11.6–15.4)
RDW: 15.2 % (ref 11.5–15.5)
WBC: 4.8 10*3/uL (ref 3.4–10.8)
WBC: 6.1 10*3/uL (ref 4.0–10.5)
nRBC: 0 % (ref 0.0–0.2)

## 2023-05-27 LAB — PROTIME-INR
INR: 1 (ref 0.8–1.2)
Prothrombin Time: 13.4 seconds (ref 11.4–15.2)

## 2023-05-27 LAB — BRAIN NATRIURETIC PEPTIDE: BNP: 151 pg/mL — ABNORMAL HIGH (ref 0.0–100.0)

## 2023-05-27 LAB — AMYLASE: Amylase: 168 U/L — ABNORMAL HIGH (ref 31–110)

## 2023-05-27 LAB — CK: Total CK: 43 U/L — ABNORMAL LOW (ref 49–397)

## 2023-05-27 LAB — LIPASE, BLOOD: Lipase: 167 U/L — ABNORMAL HIGH (ref 11–51)

## 2023-05-27 MED ORDER — FUROSEMIDE 20 MG PO TABS: 20.0000 mg | ORAL_TABLET | Freq: Every day | ORAL | 0 refills | Status: DC

## 2023-05-27 MED ORDER — IOHEXOL 300 MG/ML  SOLN
100.0000 mL | Freq: Once | INTRAMUSCULAR | Status: AC | PRN
  Administered 2023-05-27: 80 mL via INTRAVENOUS

## 2023-05-27 NOTE — ED Provider Notes (Signed)
EMERGENCY DEPARTMENT AT [facility] Provider Note   CSN: [id number] Arrival date & time: 05/27/23  1801     History  Chief Complaint  Patient presents with   Abnormal Lab    Jacob Davila is a 47 y.o. male.  Patient sent for evaluation due to abnormal labs drawn at primary care doctor's office.  History of palpitations, hepatic steatosis.  He had abnormal liver enzymes and abnormal lipase and bilirubin yesterday.  He has not been really having any abdominal pain.  Maybe some appetite issues, some leg swelling abdominal swelling.  Denies any history of heavy alcohol use.  Denies any nausea vomiting diarrhea.  No fever or chills.  He overall does not have any active abdominal pain.  No recent suspicious food intakes.  Follows with GI already for ulcer history.  The history is provided by the patient.       Home Medications Prior to Admission medications   Medication Sig Start Date End Date Taking? Authorizing Provider  furosemide (LASIX) 20 MG tablet Take 1 tablet (20 mg total) by mouth daily for 7 days. 05/27/23 06/03/23 Yes Georgana Romain, DO  acetaminophen (TYLENOL) 500 MG tablet Take 500-1,000 mg by mouth every 6 (six) hours as needed for moderate pain.    [provider]  metoprolol succinate (TOPROL XL) 25 MG 24 hr tablet Take 1 tablet (25 mg total) by mouth at bedtime. 12/09/22   Marykay Lex, MD  Multiple Vitamin (MULTIVITAMIN) tablet Take 1 tablet by mouth daily.    [provider]  omeprazole (PRILOSEC) 40 MG capsule Take 1 capsule (40 mg total) by mouth in the morning and at bedtime. 02/24/22 08/11/24  Imogene Burn, MD      Allergies    Allegra [fexofenadine], Darvon [propoxyphene], Tessalon [benzonatate], Latex, and Morphine and codeine    Review of Systems   Review of Systems  Physical Exam Updated Vital Signs BP 131/84   Pulse 82   Temp 98.4 F (36.9 C) (Oral)   Resp 20   Ht 5\' 11"  (1.803 m)   Wt 74.8 kg   SpO2 100%    BMI 23.01 kg/m  Physical Exam Vitals and nursing note reviewed.  Constitutional:      General: He is not in acute distress.    Appearance: He is well-developed.  HENT:     Head: Normocephalic and atraumatic.     Nose: Nose normal.     Mouth/Throat:     Mouth: Mucous membranes are moist.  Eyes:     Extraocular Movements: Extraocular movements intact.     Conjunctiva/sclera: Conjunctivae normal.     Pupils: Pupils are equal, round, and reactive to light.  Cardiovascular:     Rate and Rhythm: Normal rate and regular rhythm.     Pulses: Normal pulses.     Heart sounds: Normal heart sounds. No murmur heard. Pulmonary:     Effort: Pulmonary effort is normal. No respiratory distress.     Breath sounds: Normal breath sounds.  Abdominal:     Palpations: Abdomen is soft.     Tenderness: There is no abdominal tenderness.  Musculoskeletal:        General: No swelling.     Cervical back: Neck supple.     Comments: Trace edema in his legs  Skin:    General: Skin is warm and dry.     Capillary Refill: Capillary refill takes less than 2 seconds.  Neurological:     Mental Status:  He is alert.  Psychiatric:        Mood and Affect: Mood normal.     ED Results / Procedures / Treatments   Labs (all labs ordered are listed, but only abnormal results are displayed) Labs Reviewed  CBC WITH DIFFERENTIAL/PLATELET - Abnormal; Notable for the following components:      Result Value   RBC 2.78 (*)    Hemoglobin 10.2 (*)    HCT 31.2 (*)    MCV 112.2 (*)    MCH 36.7 (*)    Platelets 115 (*)    All other components within normal limits  COMPREHENSIVE METABOLIC PANEL - Abnormal; Notable for the following components:   Glucose, Bld 134 (*)    Calcium 8.3 (*)    Total Protein 6.3 (*)    Albumin 3.4 (*)    AST 190 (*)    ALT 107 (*)    Alkaline Phosphatase 463 (*)    Total Bilirubin 2.8 (*)    All other components within normal limits  LIPASE, BLOOD - Abnormal; Notable for the following  components:   Lipase 167 (*)    All other components within normal limits  PROTIME-INR  HEPATITIS PANEL, ACUTE  ANTINUCLEAR ANTIBODIES, IFA  HEPATITIS B CORE ANTIBODY, TOTAL  HEPATITIS B SURFACE ANTIGEN  FOLATE  GAMMA GT  CK  HEPATITIS A ANTIBODY, TOTAL  VITAMIN B12  IMMUNOGLOBULINS A/E/G/M, SERUM    EKG None  Radiology CT ABDOMEN PELVIS W CONTRAST  Result Date: 05/27/2023 CLINICAL DATA:  Acute abdominal pain. Patient reports abdominal swelling. EXAM: CT ABDOMEN AND PELVIS WITH CONTRAST TECHNIQUE: Multidetector CT imaging of the abdomen and pelvis was performed using the standard protocol following bolus administration of intravenous contrast. RADIATION DOSE REDUCTION: This exam was performed according to the departmental dose-optimization program which includes automated exposure control, adjustment of the mA and/or kV according to patient size and/or use of iterative reconstruction technique. CONTRAST:  80mL OMNIPAQUE IOHEXOL 300 MG/ML  SOLN COMPARISON:  Ultrasound earlier today. FINDINGS: Lower chest: Clear lung bases. Hepatobiliary: Diffusely decreased hepatic density. No focal hepatic abnormality. Unremarkable appearance of the gallbladder. No biliary dilatation. Pancreas: No ductal dilatation or inflammation. Spleen: Normal in size without focal abnormality. Adrenals/Urinary Tract: Normal adrenal glands. Markedly atrophic left kidney is tentatively visualized, coronal series 5, image 34. Right kidney demonstrates no hydronephrosis or inflammation. Small right renal cyst needs no further imaging. No visualized renal calculi. Unremarkable urinary bladder. Stomach/Bowel: The stomach is decompressed. There is no bowel obstruction or inflammation. Submucosal fatty infiltration of the cecum, ascending and proximal transverse colon. Small volume of colonic stool. The appendix is normal. Vascular/Lymphatic: Mild aortic atherosclerosis. No aneurysm. The portal vein is patent. No enlarged lymph  nodes in the abdomen or pelvis. Reproductive: Prostate is unremarkable. Other: No ascites or free air. No abdominopelvic collection. Small fat containing umbilical hernia. Musculoskeletal: Thoracolumbar scoliosis post spinal fixation. There are no acute or suspicious osseous abnormalities. IMPRESSION: 1. No acute abnormality in the abdomen/pelvis. 2. Decreased hepatic density typical hepatic steatosis. 3. 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 4. Submucosal fatty infiltration of the cecum, ascending and proximal transverse colon, can be seen with prior or chronic inflammation. No acute colonic inflammatory change. Aortic Atherosclerosis (ICD10-I70.0). Electronically Signed   By: Narda Rutherford M.D.   On: 05/27/2023 19:42   US Abdomen Limited RUQ (LIVER/GB)  Result Date: 05/27/2023 CLINICAL DATA:  Generalized abdominal pain with bloating. Elevated LFTs. EXAM: ULTRASOUND ABDOMEN LIMITED RIGHT UPPER QUADRANT COMPARISON:  Ultrasound abdomen 01/13/2022  FINDINGS: Gallbladder: No gallstones or gallbladder wall thickening. No pericholecystic fluid. The sonographer reports no sonographic Murphy's sign. Gallbladder may not be completely distended with gallbladder wall thickness upper normal dash 3 mm. No pericholecystic fluid. Sonographer reports no sonographic Murphy sign. Common bile duct: Diameter: 3 mm Liver: Diffusely increased echogenicity. No discrete or focal mass lesion evident. Portal vein is patent on color Doppler imaging with normal direction of blood flow towards the liver. Other: None. IMPRESSION: 1. Diffusely increased hepatic echogenicity without focal lesion. Imaging features suggest hepatic steatosis. 2. No evidence for cholelithiasis or biliary ductal dilatation. Electronically Signed   By: Kennith Center M.D.   On: 05/27/2023 19:02    Procedures Procedures    Medications Ordered in ED Medications  iohexol (OMNIPAQUE) 300 MG/ML solution 100 mL (80 mLs Intravenous Contrast Given  05/27/23 1923)    ED Course/ Medical Decision Making/ A&P                             Medical Decision Making Amount and/or Complexity of Data Reviewed Labs: ordered. Radiology: ordered.  Risk Prescription drug management.   Jacob Davila is here due to abnormal labs.  Normal vitals.  No fever.  History of hepatic steatosis, palpitations.  Overall his abdominal exam is benign.  He is well-appearing.  He is got a little bit of swelling in his legs.  Abdomen does not appear overly distended.  He had abnormal liver enzymes, bilirubin and lipase yesterday sent for further evaluation.  Patient today is 167, bilirubin is improved to 2.8.  AST ALT and alk phos are all stable at 190, 107, 463.  INR is normal.  He had ultrasound and CT scan of abdomen and pelvis.  CT scan shows no acute findings.  Some hepatic steatosis.  But overall no acute process.  No pancreatitis.  Ultrasound also showed hepatic steatosis but no cirrhotic changes.  No ascites.  I talked with Dr. Meridee Score with GI.  Will add additional blood work for outpatient follow-up with the GI team.  But there is no need for any further workup at this time.  He understands return precautions.  I have sent off additional labs to be followed up by GI team.  I will start him on Lasix to help with some leg swelling.  Recommend that he follow-up with his primary care doctor to have his electrolytes rechecked as well as his liver function.  Discharged in good condition.  This chart was dictated using voice recognition software.  Despite best efforts to proofread,  errors can occur which can change the documentation meaning.         Final Clinical Impression(s) / ED Diagnoses Final diagnoses:  Abnormal liver enzymes    Rx / DC Orders ED Discharge Orders          Ordered    furosemide (LASIX) 20 MG tablet  Daily        05/27/23 2113              Virgina Norfolk, DO 05/27/23 2113

## 2023-05-27 NOTE — ED Triage Notes (Signed)
Pt states that his PCP sent him for eval. Pt states since last weekend, he has had leg and abd swelling. Pt states his HR was elevated and not helped with beta blocker. Pt had bloodwork drawn, abnormal labs were found. Pt states normal Bms, denies N/V. Pt states he is not a frequent drinker.

## 2023-05-27 NOTE — Discharge Instructions (Signed)
Have your liver enzymes and electrolytes rechecked by primary care doctor in a week.  I have started you on some medicine to help with fluid retention.  He is abstain from any Tylenol use or alcohol use.  Follow-up with GI.  Please return if symptoms worsen especially if you develop fever.

## 2023-05-28 LAB — HEPATITIS B SURFACE ANTIGEN: Hepatitis B Surface Ag: NONREACTIVE

## 2023-05-28 LAB — FOLATE: Folate: 15.7 ng/mL (ref >5.9)

## 2023-05-28 LAB — VITAMIN B12: Vitamin B-12: 359 pg/mL (ref 180–914)

## 2023-05-28 LAB — HEPATITIS A ANTIBODY, TOTAL: hep A Total Ab: NONREACTIVE

## 2023-05-28 LAB — GAMMA GT: GGT: 2133 U/L — ABNORMAL HIGH (ref 7–50)

## 2023-05-28 LAB — HEPATITIS B CORE ANTIBODY, TOTAL: Hep B Core Total Ab: NONREACTIVE

## 2023-05-29 ENCOUNTER — Other Ambulatory Visit (HOSPITAL_BASED_OUTPATIENT_CLINIC_OR_DEPARTMENT_OTHER): Payer: Self-pay | Admitting: Family Medicine

## 2023-05-29 ENCOUNTER — Ambulatory Visit: Payer: Self-pay | Admitting: Medical

## 2023-05-29 ENCOUNTER — Telehealth (HOSPITAL_BASED_OUTPATIENT_CLINIC_OR_DEPARTMENT_OTHER): Payer: Self-pay | Admitting: Family Medicine

## 2023-05-29 DIAGNOSIS — K760 Fatty (change of) liver, not elsewhere classified: Secondary | ICD-10-CM

## 2023-05-29 DIAGNOSIS — R1900 Intra-abdominal and pelvic swelling, mass and lump, unspecified site: Secondary | ICD-10-CM

## 2023-05-29 NOTE — Progress Notes (Unsigned)
Chief Complaint: Primary GI MD:Dr. Leonides Schanz  HPI: 47 year old male with past medical history significant for hepatic steatosis with elevated LFTs, paroxysmal tachycardia, macrocytic anemia, presents for hospital follow-up.  Last seen 12/2021 by Quentin Mulling, PA-C.  At that time extensive serologic evaluation of elevated LFTs was completed.  Normal iron, ferritin, ANA, alpha-1 antitrypsin, ceruloplasmin, mitochondrial antibody, ASMA, and IgG.  Liver biopsy 02/2022 showed minimally active steatohepatitis and stage 2/3 liver fibrosis..  Thought to be due to possible alcoholic liver disease, nonalcoholic fatty liver disease, or possible exposure to isopropyl alcohol via inhalation per his work (less likely).  Recently seen in emergency department 7/6 for elevated liver enzymes drawn at PCP. Labs 7/5 with PCP AST 284/ALT 132/alk phos 556 Total bilirubin 4.0 Albumin 3.0 Hgb 10.3, MCV 106 Platelets 83 Lipase 187  Labs 7/6 at ED Platelets 115 AST 190/ALT 107/alk phos 463 T. bili 2.8 Lipase 167 Acute hepatitis panel negative PT/INR normal ANA pending GGT 2133 B12 359  CT abdomen pelvis with contrast shows decreased hepatic density typically with hepatic steatosis, unremarkable gallbladder, marked atrophic left kidney (nonfunctional), submucosal fatty infiltration of cecum, ascending and proximal transverse colon can be seen with chronic inflammation.   PREVIOUS GI WORKUP  RUQ ultrasound 06/24/2021: Mild increased echogenicity in the liver without focal mass (hepatic steatosis)  EGD 08/2022 done for follow-up of gastric ulcer: Mucosa suspicious for short segment Barrett's (squamocolumnar junctional mucosa with mild features suggestive of reflux, negative for intestinal metaplasia), erythematous mucosa in antrum, normal duodenum.  EGD 02/2022 for dysphagia: Nonbleeding gastric ulcers with pigmented material (reactive gastropathy with minimal chronic gastritis negative for H. pylori), many  nonbleeding erosions in the middle third of esophagus and lower third of the esophagus, normal duodenum (reactive duodenal mucosa negative for intraepithelial lymphocytosis)  Colonoscopy 02/2022 for screening: Multiple diverticula in ascending, 2 tubular adenomas and sigmoid (3 to 5 mm in size), nonbleeding internal hemorrhoids   Past Medical History:  Diagnosis Date   Allergy    Scoliosis    - s/p correctvie Sgx   Tachycardia    Venous insufficiency of both lower extremities 05/2021   Lower extremity venous Doppler 05/29/2021: No evidence of DVT.  Venous reflux noted in the right GSV in the calf.  Venous reflux noted in the left common femoral vein and left greater saphenous vein in the thigh.  Left GSV in the calf as well as a left perforator vein. => Forwarded to vascular surgery:    Past Surgical History:  Procedure Laterality Date   BACK SURGERY  03/1990   Scoliosis   COLONOSCOPY     ENDOVENOUS ABLATION SAPHENOUS VEIN W/ LASER Left 04/20/2022   endovenous laser ablation left greater saphenous vein and stab phlebectomy 10-20 incisions left leg by Cari Caraway MD   TRANSTHORACIC ECHOCARDIOGRAM  06/08/2019   EF 60-65%.  Normal wall motion.  Normal relaxation.  Mild aortic valve thickening but no sclerosis/stenosis.  Normal echo   UPPER GASTROINTESTINAL ENDOSCOPY  02/21/2022   Leonides Schanz    Current Outpatient Medications  Medication Sig Dispense Refill   acetaminophen (TYLENOL) 500 MG tablet Take 500-1,000 mg by mouth every 6 (six) hours as needed for moderate pain.     furosemide (LASIX) 20 MG tablet Take 1 tablet (20 mg total) by mouth daily for 7 days. 7 tablet 0   metoprolol succinate (TOPROL XL) 25 MG 24 hr tablet Take 1 tablet (25 mg total) by mouth at bedtime. 90 tablet 2   Multiple Vitamin (MULTIVITAMIN) tablet Take  1 tablet by mouth daily.     omeprazole (PRILOSEC) 40 MG capsule Take 1 capsule (40 mg total) by mouth in the morning and at bedtime. 112 capsule 0   Current  Facility-Administered Medications  Medication Dose Route Frequency Provider Last Rate Last Admin   0.9 %  sodium chloride infusion  500 mL Intravenous Once Imogene Burn, MD        Allergies as of 05/31/2023 - Review Complete 05/27/2023  Allergen Reaction Noted   Allegra [fexofenadine] Other (See Comments) 08/11/2022   Darvon [propoxyphene] Itching 05/27/2021   Tessalon [benzonatate] Itching 05/27/2021   Latex Dermatitis and Rash 04/19/2021   Morphine and codeine Rash 04/19/2021    Family History  Problem Relation Age of Onset   Atrial fibrillation Mother    Heart failure Mother    Hypertension Father    Hyperlipidemia Father    Colon cancer Maternal Aunt    Colon cancer Paternal Uncle    CAD Paternal Grandfather        noted to have Angina (not aware of details)   Stomach cancer Neg Hx    Esophageal cancer Neg Hx    Rectal cancer Neg Hx    Pancreatic cancer Neg Hx     Social History   Socioeconomic History   Marital status: Single    Spouse name: Not on file   Number of children: Not on file   Years of education: Not on file   Highest education level: Not on file  Occupational History   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  He reports the only medication he takes is Benadryl, and that he takes approximately 2 tablets/day.  As a Charity fundraiser, he reports he is exposed to many chemicals, but is wearing his protective gear, and reports that other chemists work with the most dangerous chemicals and have special training to do so.  He has never had  symptoms like this before.     Social Determinants of Health   Financial Resource Strain: Not on file  Food Insecurity: Not on file  Transportation Needs: Not on file  Physical Activity: Not on file  Stress: Not on file  Social Connections: Not on file  Intimate Partner Violence: Not on file    Review of Systems:    Constitutional: No weight loss, fever, chills, weakness or fatigue HEENT: Eyes: No change in vision               Ears, Nose, Throat:  No change in hearing or congestion Skin: No rash or itching Cardiovascular: No chest pain, chest pressure or palpitations   Respiratory: No SOB or cough Gastrointestinal: See HPI and otherwise negative Genitourinary: No dysuria or change in urinary frequency Neurological: No headache, dizziness or syncope Musculoskeletal: No new muscle or joint pain Hematologic: No bleeding or bruising Psychiatric: No history of depression or anxiety    Physical Exam:  Vital signs: There were no vitals taken for this visit.  Constitutional: NAD, Well developed, Well nourished, alert and cooperative Head:  Normocephalic and atraumatic. Eyes:   PEERL, EOMI. No icterus. Conjunctiva pink. Respiratory: Respirations even and unlabored. Lungs clear to auscultation bilaterally.   No wheezes, crackles, or rhonchi.  Cardiovascular:  Regular rate and rhythm. No peripheral edema, cyanosis or pallor.  Gastrointestinal:  Soft, nondistended, nontender. No rebound or guarding. Normal bowel sounds. No appreciable masses or hepatomegaly. Rectal:  Not performed.  Msk:  Symmetrical without gross deformities. Without edema, no deformity or joint abnormality.  Neurologic:  Alert and  oriented x4;  grossly normal neurologically.  Skin:   Dry and intact without significant lesions or rashes. Psychiatric: Oriented to person, place and time. Demonstrates good judgement and reason without abnormal affect or behaviors.   RELEVANT LABS AND IMAGING: CBC    Component  Value Date/Time   WBC 6.1 05/27/2023 1822   RBC 2.78 (L) 05/27/2023 1822   HGB 10.2 (L) 05/27/2023 1822   HGB 10.3 (L) 05/26/2023 0937   HCT 31.2 (L) 05/27/2023 1822   HCT 31.0 (L) 05/26/2023 0937   PLT 115 (L) 05/27/2023 1822   PLT 83 (LL) 05/26/2023 0937   MCV 112.2 (H) 05/27/2023 1822   MCV 106 (H) 05/26/2023 0937   MCH 36.7 (H) 05/27/2023 1822   MCHC 32.7 05/27/2023 1822   RDW 15.2 05/27/2023 1822   RDW 13.6 05/26/2023 0937   LYMPHSABS 1.3 05/27/2023 1822   LYMPHSABS 1.5 05/26/2023 0937   MONOABS 0.8 05/27/2023 1822   EOSABS 0.1 05/27/2023 1822   EOSABS 0.1 05/26/2023 0937   BASOSABS 0.1 05/27/2023 1822   BASOSABS 0.0 05/26/2023 0937    CMP     Component Value Date/Time   NA 135 05/27/2023 1822   NA 137 05/26/2023 0937   K 3.7 05/27/2023 1822   CL 101 05/27/2023 1822   CO2 27 05/27/2023 1822   GLUCOSE 134 (H) 05/27/2023 1822   BUN 6 05/27/2023 1822   BUN 7 05/26/2023 0937   CREATININE 0.96 05/27/2023 1822   CALCIUM 8.3 (L) 05/27/2023 1822   PROT 6.3 (L) 05/27/2023 1822   PROT 5.7 (L) 05/26/2023 0937   ALBUMIN 3.4 (L) 05/27/2023 1822   ALBUMIN 3.0 (L) 05/26/2023 0937   AST 190 (H) 05/27/2023 1822   ALT 107 (H) 05/27/2023 1822   ALKPHOS 463 (H) 05/27/2023 1822   BILITOT 2.8 (H) 05/27/2023 1822   BILITOT 4.0 (H) 05/26/2023 0937   GFRNONAA >60 05/27/2023 1822    Assessment: 1. ***  Plan: 1. ***     Lara Mulch [facility] 05/29/2023, 12:59 PM  Cc: de [place], Raymond J, MD

## 2023-05-29 NOTE — Telephone Encounter (Signed)
Patient has an appointment 06/01/23  with cardiology  APP.

## 2023-05-29 NOTE — Progress Notes (Signed)
Patient was called at 1549 on 05/27/23 to speak with him regarding abnormal lab results. He reported mild to moderate abdominal pain and decreased appetite ongoing. I recommended evaluation at local ER for possible pancreatitis, worsening liver function or other acute GI issue. Pt agreeable and stated he would be going to the ER that evening.

## 2023-05-29 NOTE — Telephone Encounter (Signed)
**Note Jacob-Identified via Obfuscation** Davila was seen by Jerre Simon in our office and is a Jacob Davila. Davila went to the ED on 7/6 and was told to repeat labs in our office on 06/02/23. Please advise Davila and order the labs if necessary or does he need to follow up with PCP 1st

## 2023-05-30 ENCOUNTER — Telehealth: Payer: Self-pay

## 2023-05-30 LAB — ANTINUCLEAR ANTIBODIES, IFA: ANA Ab, IFA: POSITIVE — AB

## 2023-05-30 LAB — FANA STAINING PATTERNS: Speckled Pattern: 24529

## 2023-05-30 NOTE — Telephone Encounter (Signed)
-----   Message from Lemar Lofty., MD sent at 05/28/2023  1:57 AM EDT ----- Regarding: Need for followup in clinic Beth, This is a patient of Dr. Derek Mound who has seen Marchelle Folks in the past as well with underlying steatohepatitis.  Patient's liver biochemical testing was checked by PCP yesterday and then she was sent in for further evaluation in the emergency department with some mild lower extremity edema and improving LFT pattern (though significantly elevated compared to a year ago when he was last seen by our service). I had some additional laboratories performed before he was discharged from the [facility] department, as they were not going to admit him to the hospital. I think this was reasonable since the liver biochemical testing was improving with the patient had normal mentation and retained synthetic function. 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 Please set up next available clinic visit in the next 2 to 3 weeks with Dr. Leonides Schanz or one of our APP's. Thanks. GM

## 2023-05-31 ENCOUNTER — Encounter: Payer: Self-pay | Admitting: Gastroenterology

## 2023-05-31 ENCOUNTER — Ambulatory Visit (INDEPENDENT_AMBULATORY_CARE_PROVIDER_SITE_OTHER): Payer: Medicaid Other | Admitting: Gastroenterology

## 2023-05-31 ENCOUNTER — Other Ambulatory Visit (INDEPENDENT_AMBULATORY_CARE_PROVIDER_SITE_OTHER): Payer: Medicaid Other

## 2023-05-31 DIAGNOSIS — K760 Fatty (change of) liver, not elsewhere classified: Secondary | ICD-10-CM | POA: Diagnosis not present

## 2023-05-31 DIAGNOSIS — R7989 Other specified abnormal findings of blood chemistry: Secondary | ICD-10-CM

## 2023-05-31 LAB — CBC WITH DIFFERENTIAL/PLATELET
Basophils Absolute: 0.1 10*3/uL (ref 0.0–0.1)
Basophils Relative: 1.2 % (ref 0.0–3.0)
Eosinophils Absolute: 0.1 10*3/uL (ref 0.0–0.7)
Eosinophils Relative: 3.4 % (ref 0.0–5.0)
HCT: 30.2 % — ABNORMAL LOW (ref 39.0–52.0)
Hemoglobin: 9.8 g/dL — ABNORMAL LOW (ref 13.0–17.0)
Lymphocytes Relative: 47.1 % — ABNORMAL HIGH (ref 12.0–46.0)
Lymphs Abs: 2 10*3/uL (ref 0.7–4.0)
MCHC: 32.3 g/dL (ref 30.0–36.0)
MCV: 115.5 fl — ABNORMAL HIGH (ref 78.0–100.0)
Monocytes Absolute: 0.5 10*3/uL (ref 0.1–1.0)
Monocytes Relative: 12.3 % — ABNORMAL HIGH (ref 3.0–12.0)
Neutro Abs: 1.6 10*3/uL (ref 1.4–7.7)
Neutrophils Relative %: 36 % — ABNORMAL LOW (ref 43.0–77.0)
Platelets: 269 10*3/uL (ref 150.0–400.0)
RBC: 2.62 Mil/uL — ABNORMAL LOW (ref 4.22–5.81)
RDW: 18.2 % — ABNORMAL HIGH (ref 11.5–15.5)
WBC: 4.3 10*3/uL (ref 4.0–10.5)

## 2023-05-31 LAB — COMPREHENSIVE METABOLIC PANEL
ALT: 71 U/L — ABNORMAL HIGH (ref 0–53)
AST: 110 U/L — ABNORMAL HIGH (ref 0–37)
Albumin: 3.5 g/dL (ref 3.5–5.2)
Alkaline Phosphatase: 451 U/L — ABNORMAL HIGH (ref 39–117)
BUN: 13 mg/dL (ref 6–23)
CO2: 27 mEq/L (ref 19–32)
Calcium: 9.3 mg/dL (ref 8.4–10.5)
Chloride: 101 mEq/L (ref 96–112)
Creatinine, Ser: 1.31 mg/dL (ref 0.40–1.50)
GFR: 64.5 mL/min (ref >60.00)
Glucose, Bld: 75 mg/dL (ref 70–99)
Potassium: 4.1 mEq/L (ref 3.5–5.1)
Sodium: 136 mEq/L (ref 135–145)
Total Bilirubin: 1.9 mg/dL — ABNORMAL HIGH (ref 0.2–1.2)
Total Protein: 6.6 g/dL (ref 6.0–8.3)

## 2023-05-31 LAB — PROTIME-INR
INR: 1.1 ratio — ABNORMAL HIGH (ref 0.8–1.0)
Prothrombin Time: 11.6 s (ref 9.6–13.1)

## 2023-05-31 LAB — SEDIMENTATION RATE: Sed Rate: 36 mm/hr — ABNORMAL HIGH (ref 0–15)

## 2023-05-31 LAB — C-REACTIVE PROTEIN: CRP: <1 mg/dL (ref 0.5–20.0)

## 2023-05-31 LAB — IMMUNOGLOBULINS A/E/G/M, SERUM
IgA: 317 mg/dL (ref 90–386)
IgE (Immunoglobulin E), Serum: 8 IU/mL (ref 6–495)
IgG (Immunoglobin G), Serum: 966 mg/dL (ref 603–1613)
IgM (Immunoglobulin M), Srm: 156 mg/dL (ref 20–172)

## 2023-05-31 LAB — GAMMA GT: GGT: 2104 U/L — ABNORMAL HIGH (ref 7–51)

## 2023-05-31 NOTE — Patient Instructions (Addendum)
_______________________________________________________  If your blood pressure at your visit was 140/90 or greater, please contact your primary care physician to follow up on this.  If you are age 48 or younger, your body mass index should be between 19-25. Your There is no height or weight on file to calculate BMI. If this is out of the aformentioned range listed, please consider follow up with your Primary Care Provider.  ________________________________________________________  The [facility] providers would like to encourage you to use [facility] to communicate with providers for non-urgent requests or questions.  Due to long hold times on the telephone, sending your provider a message by [facility] may be a faster and more efficient way to get a response.  Please allow 48 business hours for a response.  Please remember that this is for non-urgent requests.  _______________________________________________________  Your provider has requested that you go to the basement level for lab work before leaving today. Press "B" on the elevator. The lab is located at the first door on the left as you exit the elevator.  You are scheduled to follow up in our office on 09-08-23 at 9:50am on the 2nd floor of our building with Dr Leonides Schanz.  Thank you for entrusting me with your care and choosing [facility].  [place], PA-C

## 2023-06-01 ENCOUNTER — Ambulatory Visit: Payer: Medicaid Other | Attending: Medical | Admitting: Nurse Practitioner

## 2023-06-01 ENCOUNTER — Encounter: Payer: Self-pay | Admitting: Nurse Practitioner

## 2023-06-01 VITALS — BP 112/60 | HR 69 | Ht 71.0 in | Wt 162.8 lb

## 2023-06-01 DIAGNOSIS — R002 Palpitations: Secondary | ICD-10-CM

## 2023-06-01 DIAGNOSIS — R600 Localized edema: Secondary | ICD-10-CM

## 2023-06-01 DIAGNOSIS — K760 Fatty (change of) liver, not elsewhere classified: Secondary | ICD-10-CM

## 2023-06-01 DIAGNOSIS — I479 Paroxysmal tachycardia, unspecified: Secondary | ICD-10-CM | POA: Diagnosis not present

## 2023-06-01 DIAGNOSIS — R198 Other specified symptoms and signs involving the digestive system and abdomen: Secondary | ICD-10-CM

## 2023-06-01 DIAGNOSIS — I83893 Varicose veins of bilateral lower extremities with other complications: Secondary | ICD-10-CM

## 2023-06-01 MED ORDER — FUROSEMIDE 20 MG PO TABS: ORAL_TABLET | ORAL | 3 refills | Status: DC

## 2023-06-01 MED ORDER — METOPROLOL SUCCINATE ER 25 MG PO TB24: 35.5000 mg | ORAL_TABLET | Freq: Every day | ORAL | 2 refills | Status: DC

## 2023-06-01 NOTE — Progress Notes (Addendum)
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  Past Medical History    Past Medical History:  Diagnosis Date   Allergy    Scoliosis    - s/p correctvie Sgx   Tachycardia    Venous insufficiency of both lower extremities 05/2021   Lower extremity venous Doppler 05/29/2021: No evidence of DVT.  Venous reflux noted in the right GSV in the calf.  Venous reflux noted in the left common femoral vein and left greater saphenous vein in the thigh.  Left GSV in the calf as well as a left perforator vein. => Forwarded to vascular surgery:   Past Surgical History:  Procedure Laterality Date   BACK SURGERY  03/1990   Scoliosis   COLONOSCOPY     ENDOVENOUS ABLATION SAPHENOUS VEIN W/ LASER Left 04/20/2022   endovenous laser ablation left greater saphenous vein and stab phlebectomy 10-20 incisions left leg by Cari Caraway MD   TRANSTHORACIC ECHOCARDIOGRAM  06/08/2019   EF 60-65%.  Normal wall motion.  Normal relaxation.  Mild aortic valve thickening but no sclerosis/stenosis.  Normal echo   UPPER GASTROINTESTINAL ENDOSCOPY  02/21/2022   [facility]    Allergies  Allergies  Allergen Reactions   Allegra [Fexofenadine] Other (See Comments)    Blood rushes out of head and faints   Darvon [Propoxyphene] Itching    Made skin feel like it was crawling    Tessalon [Benzonatate] Itching    Made skin feel like it was crawling    Latex Dermatitis and Rash   Morphine And Codeine Rash     Labs/Other Studies Reviewed    The following studies were reviewed today:  Cardiac  Studies & Procedures       ECHOCARDIOGRAM  ECHOCARDIOGRAM COMPLETE 06/07/2021  Narrative ECHOCARDIOGRAM REPORT    Patient Name:   Jacob Davila Date of Exam: 06/07/2021 Medical Rec #:  [id number]     Height:       71.0 in Accession #:    [id number]    Weight:       161.9 lb Date of Birth:  1976-11-14     BSA:          1.927 m Patient Age:    45 years      BP:           136/74 mmHg Patient Gender: M             HR:           91 bpm. Exam Location:  [address]  Procedure: 2D Echo, 3D Echo, Cardiac Doppler, Color Doppler and Strain Analysis  Indications:    I47.9 Paroxysmal Tachycardia R42 Dizziness  History:        Patient has no prior history of Echocardiogram examinations. Scoliosis.  Sonographer:    Daphine Deutscher RDCS Referring Phys: 6 DAVID W HARDING  IMPRESSIONS   1. Left ventricular ejection fraction, by estimation, is 60 to 65%. Left ventricular ejection fraction by 3D volume is 56 %. The left ventricle has normal function. The left ventricle has no regional wall motion abnormalities. Left ventricular diastolic parameters were normal.  The average left ventricular global longitudinal strain is -25.3 %. The global longitudinal strain is normal. 2. Right ventricular systolic function is normal. The right ventricular size is normal. 3. The mitral valve is normal in structure. No evidence of mitral valve regurgitation. No evidence of mitral stenosis. 4. The aortic valve is tricuspid. There is mild thickening of the aortic valve. Aortic valve regurgitation is not visualized. No aortic stenosis is present. 5. The inferior vena cava is normal in size with greater than 50% respiratory variability, suggesting right atrial pressure of 3 mmHg.  Comparison(s): No prior Echocardiogram.  FINDINGS Left Ventricle: Left ventricular ejection fraction, by estimation, is 60 to 65%. Left ventricular ejection fraction by 3D volume is 56 %. The left ventricle has normal function.  The left ventricle has no regional wall motion abnormalities. The average left ventricular global longitudinal strain is -25.3 %. The global longitudinal strain is normal. The left ventricular internal cavity size was normal in size. There is no left ventricular hypertrophy. Left ventricular diastolic parameters were normal.  Right Ventricle: The right ventricular size is normal. No increase in right ventricular wall thickness. Right ventricular systolic function is normal.  Left Atrium: Left atrial size was normal in size.  Right Atrium: Right atrial size was normal in size.  Pericardium: There is no evidence of pericardial effusion.  Mitral Valve: The mitral valve is normal in structure. No evidence of mitral valve regurgitation. No evidence of mitral valve stenosis.  Tricuspid Valve: The tricuspid valve is normal in structure. Tricuspid valve regurgitation is trivial. No evidence of tricuspid stenosis.  Aortic Valve: The aortic valve is tricuspid. There is mild thickening of the aortic valve. Aortic valve regurgitation is not visualized. No aortic stenosis is present.  Pulmonic Valve: The pulmonic valve was normal in structure. Pulmonic valve regurgitation is trivial. No evidence of pulmonic stenosis.  Aorta: The aortic root and ascending aorta are structurally normal, with no evidence of dilitation.  Venous: The inferior vena cava is normal in size with greater than 50% respiratory variability, suggesting right atrial pressure of 3 mmHg.  IAS/Shunts: The atrial septum is grossly normal.   LEFT VENTRICLE PLAX 2D LVIDd:         4.20 cm         Diastology LVIDs:         2.60 cm         LV e' medial:    11.00 cm/s LV PW:         0.80 cm         LV E/e' medial:  5.6 LV IVS:        0.80 cm         LV e' lateral:   14.30 cm/s LVOT diam:     2.40 cm         LV E/e' lateral: 4.3 LV SV:         68 LV SV Index:   35              2D LVOT Area:     4.52 cm        Longitudinal Strain 2D  Strain GLS  -24.9 % (A2C): 2D Strain GLS  -26.1 % (A3C): 2D Strain GLS  -25.1 % (A4C): 2D Strain GLS  -25.3 % Avg:  3D Volume EF LV 3D EF:    Left ventricular ejection fraction by 3D volume is 56 %.  3D Volume EF: 3D EF:        56 %  LV EDV:       127 ml LV ESV:       56 ml LV SV:        71 ml  RIGHT VENTRICLE             IVC RV Basal diam:  3.40 cm     IVC diam: 0.90 cm RV S prime:     21.40 cm/s TAPSE (M-mode): 2.5 cm  LEFT ATRIUM             Index       RIGHT ATRIUM           Index LA diam:        3.60 cm 1.87 cm/m  RA Area:     11.00 cm LA Vol (A2C):   32.2 ml 16.71 ml/m RA Volume:   23.60 ml  12.24 ml/m LA Vol (A4C):   24.5 ml 12.71 ml/m LA Biplane Vol: 28.9 ml 14.99 ml/m AORTIC VALVE LVOT Vmax:   83.90 cm/s LVOT Vmean:  57.200 cm/s LVOT VTI:    0.150 m  AORTA Ao Root diam: 3.30 cm Ao Asc diam:  2.80 cm  MITRAL VALVE               TRICUSPID VALVE MV Area (PHT): 4.80 cm    TR Peak grad:   15.2 mmHg MV Decel Time: 158 msec    TR Vmax:        195.00 cm/s MV E velocity: 62.10 cm/s MV A velocity: 77.60 cm/s  SHUNTS MV E/A ratio:  0.80        Systemic VTI:  0.15 m Systemic Diam: 2.40 cm  Riley Lam MD Electronically signed by Riley Lam MD Signature Date/Time: 06/07/2021/2:54:32 PM    Final    MONITORS  CARDIAC EVENT MONITOR 06/29/2021  Narrative  Monitor worn from 7/8-8/6 2022  Baseline rhythm was sinus rhythm: Minimum heart rate 63 bpm, max heart rate 158 bpm, average 94 bpm.  Rare(<1%) PACs and PVCs noted.  Sinus tachycardia noted but no arrhythmias noted.  No tachyarrhythmias or bradycardia arrhythmias noted. (No atrial fibrillation, atrial flutter, supraventricular tachycardia, paroxysmal atrial tachycardia or ventricular tachycardia).  No bradycardia noted.  No patient triggered events.  Pretty normal study.  Shows normal sinus rhythm that can go fast.  No slow rhythms.  No abnormal rhythms.   Discussed in  clinic.  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  Lower extremity venous reflux study in 2022 in the setting of bilateral lower extremity edema showed evidence of chronic venous insufficiency.  He was referred to vascular surgery for ongoing management.  Echocardiogram in 05/2021 EF 60 to 65%, normal LV function, no RWMA, normal RV, no significant valvular abnormalities.  Cardiac monitor in 06/2021 showed sinus rhythm, sinus tachycardia, no arrhythmia. He was last seen in the office  on 12/31/2021 and was stable overall from a cardiac standpoint. Ongoing management per vascular surgery was recommended for chronic bilateral lower extremity edema.  He was started on low-dose Lasix (per patient he never received this prescription).  Home monitoring with Kardia mobile device suggested ongoing intermittent sinus tachycardia.  Additionally, he was started on low-dose metoprolol.  Diltiazem was discontinued (it was felt that this could be contributing to his lower extremity edema).  He underwent successful ablation of the left great saphenous vein from knee to the saphenofemoral junction in 04/2022. He contacted our office on 05/23/2023 with concern for increased bilateral lower extremity edema, abdominal fullness.  He was advised to go to the emergency  room, however, patient declined. Follow-up with vascular surgery was recommended.  He was evaluated in the ED on 05/27/2023 in the setting of abnormal labs, elevated liver enzymes, abnormal lipase and bilirubin (labs drawn per PCP).  CT of the abdomen pelvis without acute findings, some hepatic steatosis.  Abdominal ultrasound showed hepatic steatosis, no cirrhotic changes, no ascites.  Outpatient follow-up with GI was recommended.   He presents today for follow-up. Since his last visit he has been stable from a cardiac standpoint though he reports he feels "horrible." He notes slight improvement in his abdominal fullness with Lasix.  He reports bilateral lower extremity edema (not appreciated on exam).  He denies chest pain, palpitations, dyspnea, PND, orthopnea, weight gain.  He is not sleeping and not eating well. He notes that his heart rate has been elevated at 100 bpm at rest and he believes this is keeping him awake at night.  He is frustrated that most of his tests have come back normal yet he continues to feel poorly.  Home Medications    Current Outpatient Medications  Medication Sig Dispense Refill   ibuprofen (ADVIL) 100 MG tablet Take 100 mg by mouth every 6 (six) hours as needed for fever.     Multiple Vitamin (MULTIVITAMIN) tablet Take 1 tablet by mouth daily.     omeprazole (PRILOSEC) 40 MG capsule Take 1 capsule (40 mg total) by mouth in the morning and at bedtime. 112 capsule 0   furosemide (LASIX) 20 MG tablet Take 20 mg daily as needed for weight gain of 3 lb overnight or 5 lb in 1 week. 90 tablet 3   metoprolol succinate (TOPROL XL) 25 MG 24 hr tablet Take 1.5 tablets (37.5 mg total) by mouth at bedtime. 180 tablet 2   Current Facility-Administered Medications  Medication Dose Route Frequency Provider Last Rate Last Admin   0.9 %  sodium chloride infusion  500 mL Intravenous Once Imogene Burn, MD         Review of Systems    He denies chest pain, palpitations, dyspnea, pnd,  orthopnea, n, v, dizziness, syncope, weight gain, or early satiety. All other systems reviewed and are otherwise negative except as noted above.   Physical Exam    VS:  BP 112/60 (BP Location: Left Arm, Patient Position: Sitting, Cuff Size: Normal)   Pulse 69   Ht 5\' 11"  (1.803 m)   Wt 162 lb 12.8 oz (73.8 kg)   SpO2 96%   BMI 22.71 kg/m  GEN: Well nourished, well developed, in no acute distress. HEENT: normal. Neck: Supple, no JVD, carotid bruits, or masses. Cardiac: RRR, no murmurs, rubs, or gallops. No clubbing, cyanosis, edema.  Radials/DP/PT 2+ and equal bilaterally.  Respiratory:  Respirations regular and unlabored, clear to auscultation bilaterally. GI: Soft, nontender, nondistended, BS +  x 4. MS: no deformity or atrophy. Skin: warm and dry, no rash. Neuro:  Strength and sensation are intact. Psych: Normal affect.  Accessory Clinical Findings    ECG personally reviewed by me today - EKG Interpretation Date/Time:  Thursday June 01 2023 14:09:27 EDT Ventricular Rate:  69 PR Interval:  120 QRS Duration:  88 QT Interval:  394 QTC Calculation: 422 R Axis:   45  Text Interpretation: Normal sinus rhythm Normal ECG When compared with ECG of 05-Sep-2022 11:36, PREVIOUS ECG IS PRESENT Confirmed by Bernadene Person ([id number]) on 06/01/2023 2:12:48 PM  - no acute changes.   Lab Results  Component Value Date   WBC 4.3 05/31/2023   HGB 9.8 (L) 05/31/2023   HCT 30.2 (L) 05/31/2023   MCV 115.5 Repeated and verified X2. (H) 05/31/2023   PLT 269.0 05/31/2023   Lab Results  Component Value Date   CREATININE 1.31 05/31/2023   BUN 13 05/31/2023   NA 136 05/31/2023   K 4.1 05/31/2023   CL 101 05/31/2023   CO2 27 05/31/2023   Lab Results  Component Value Date   ALT 71 (H) 05/31/2023   AST 110 (H) 05/31/2023   GGT 2,133 (H) 05/27/2023   ALKPHOS 451 (H) 05/31/2023   BILITOT 1.9 (H) 05/31/2023   Lab Results  Component Value Date   CHOL 244 (H) 08/26/2022   HDL 150 08/26/2022    LDLCALC 58 08/26/2022   TRIG 241 (H) 08/26/2022   CHOLHDL 1.6 08/26/2022    No results found for: "HGBA1C"  Assessment & Plan    1. Bilateral lower extremity edema:  Echo in 05/2021 EF 60 to 65%, normal LV function, no RWMA, normal RV, no significant valvular abnormalities.  He has chronic, dependent lower extremity edema per prior office notes.  He contacted our office on 05/23/2023 with concern for increased bilateral lower extremity edema, abdominal fullness.  He was seen in the ED with similar symptoms, liver enzymes were also elevated.  CT of the abdomen/pelvis without acute findings, abdominal ultrasound without acute findings, hepatic steatosis. Euvolemic and well compensated on exam. No edema appreciated on exam today.  He denies PND, orthopnea, weight gain.  He states his abdominal fullness has improved somewhat with Lasix.  Will repeat echo.  Will continue Lasix as needed for swelling, weight gain.  Reviewed ED precautions.  2. Paroxysmal tachycardia/palpitations: Most recent echo as above. Cardiac monitor in 06/2021 showed sinus rhythm, sinus tachycardia, no arrhythmia.  He denies any palpitations though he does note that his heart rate has been elevated at 100 bpm while resting in bed at night and this has prevented him from sleeping.  He is interested in increasing his metoprolol.  Will increase metoprolol to 37.5 mg daily.  Repeat echo pending as above.  Continue to monitor HR/BP.  3. Abdominal fullness/elevated liver enzymes/hepatic steatosis: See #1. Following with GI.   4. Venous insufficiency/varicose veins: S/p successful ablation of the left great saphenous vein from knee to the saphenofemoral junction in 04/2022. Stable. Following with vascular surgery.   5. Disposition: Follow-up as scheduled with Dr. Herbie Baltimore in 07/2023.      Joylene Grapes, NP 06/01/2023, 2:43 PM

## 2023-06-01 NOTE — Patient Instructions (Addendum)
Medication Instructions:  Furosemide (Lasix) 20 mg daily as need for weight gain of 3 lb overnight or 5 lb in 1 week.  Increase Metoprolol 37.5 mg daily  *If you need a refill on your cardiac medications before your next appointment, please call your pharmacy*   Lab Work: NONE ordered at this time of appointment    Testing/Procedures: Your physician has requested that you have an echocardiogram. Echocardiography is a painless test that uses sound waves to create images of your heart. It provides your doctor with information about the size and shape of your heart and how well your heart's chambers and valves are working. This procedure takes approximately one hour. There are no restrictions for this procedure. Please do NOT wear cologne, perfume, aftershave, or lotions (deodorant is allowed). Please arrive 15 minutes prior to your appointment time.     Follow-Up: At [facility], you and your health needs are our priority.  As part of our continuing mission to provide you with exceptional heart care, we have created designated Provider Care Teams.  These Care Teams include your primary Cardiologist (physician) and Advanced Practice Providers (APPs -  Physician Assistants and Nurse Practitioners) who all work together to provide you with the care you need, when you need it.  We recommend signing up for the patient portal called "MyChart".  Sign up information is provided on this After Visit Summary.  MyChart is used to connect with patients for Virtual Visits (Telemedicine).  Patients are able to view lab/test results, encounter notes, upcoming appointments, etc.  Non-urgent messages can be sent to your provider as well.   To learn more about what you can do with MyChart, go to [email or web address].    Your next appointment:    Keep Follow up   Provider:   Bryan Lemma, MD

## 2023-06-01 NOTE — Progress Notes (Signed)
I agree with the assessment and plan as outlined by Ms. McMichael. Patient has developed another flare of his underlying liver disease. His AST:ALT ratio could be suggestive of alcoholic hepatitis. However, patient denies significant alcohol use. 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 He also definitely needs to stop drinking alcohol. His weight is not such that I would typically be worried about significant progression of NASH. His CT did show some submucosal fatty infiltration of the colon that could suggest a recent GI infection. I would not be concerned about IBD since he just had a colonoscopy in 2023 that did not show any signs if colon inflammation. Infections can also lead to elevations in liver tests. Recently positive ANA is interesting but his last liver biopsy did not show any evidence of autoimmune hepatitis so would be surprised if he had any signs of autoimmune hepatitis at this time. If the patient were to develop another hepatitis flare in the future, then could consider checking ANA, IgG, and ASMA at that time.

## 2023-06-02 ENCOUNTER — Other Ambulatory Visit: Payer: Self-pay | Admitting: *Deleted

## 2023-06-02 ENCOUNTER — Telehealth: Payer: Self-pay

## 2023-06-02 ENCOUNTER — Other Ambulatory Visit (HOSPITAL_BASED_OUTPATIENT_CLINIC_OR_DEPARTMENT_OTHER): Payer: Self-pay

## 2023-06-02 DIAGNOSIS — I872 Venous insufficiency (chronic) (peripheral): Secondary | ICD-10-CM

## 2023-06-02 DIAGNOSIS — K760 Fatty (change of) liver, not elsewhere classified: Secondary | ICD-10-CM

## 2023-06-02 DIAGNOSIS — R7989 Other specified abnormal findings of blood chemistry: Secondary | ICD-10-CM

## 2023-06-02 NOTE — Telephone Encounter (Signed)
Caller: Patient  Concern: leg swelling, numbness  Pt denies wounds, discoloration, coldness  Location: left leg, right leg  Description:  no real improvement since procedure in 04/2022  Treatments:  elevation & compression  Resolution: Appointment scheduled for first available on two separate days to facilitate earlier appt  Next Appt: Appointment scheduled for 7/12 @ 1600 for [place] and 7/14 @ 1020 for Dr. Edilia Bo

## 2023-06-05 ENCOUNTER — Ambulatory Visit (HOSPITAL_COMMUNITY)
Admission: RE | Admit: 2023-06-05 | Discharge: 2023-06-05 | Disposition: A | Discharge status: Home / Self Care | Payer: Medicaid Other | Source: Ambulatory Visit | Attending: Vascular Surgery | Admitting: Vascular Surgery

## 2023-06-05 DIAGNOSIS — I872 Venous insufficiency (chronic) (peripheral): Secondary | ICD-10-CM | POA: Insufficient documentation

## 2023-06-05 NOTE — Telephone Encounter (Signed)
Spoke with the patient. He was seen in the office in follow up and treatment plan was discussed. He is coming in for repeat labs in approximately 3 more weeks. See the office visit and labs from 05/31/23. Canceled appointment of 06/12/23.

## 2023-06-05 NOTE — Telephone Encounter (Signed)
Called the patient. No answer. Left a voicemail offering the follow up appointment for 06/12/23 at 1:30 pm with Quentin Mulling, PA. Message asks for return call to confirm of reschedule.

## 2023-06-07 ENCOUNTER — Ambulatory Visit (INDEPENDENT_AMBULATORY_CARE_PROVIDER_SITE_OTHER): Payer: Medicaid Other | Admitting: Vascular Surgery

## 2023-06-07 ENCOUNTER — Encounter: Payer: Self-pay | Admitting: Vascular Surgery

## 2023-06-07 VITALS — BP 105/69 | HR 74 | Temp 98.0°F | Resp 16 | Ht 71.0 in | Wt 162.0 lb

## 2023-06-07 DIAGNOSIS — I872 Venous insufficiency (chronic) (peripheral): Secondary | ICD-10-CM

## 2023-06-07 NOTE — Progress Notes (Signed)
REASON FOR VISIT:   Leg swelling  MEDICAL ISSUES:   CHRONIC VENOUS INSUFFICIENCY: This patient has undergone previous laser ablation of the left great saphenous vein.  He has deep venous reflux bilaterally and does have some superficial venous reflux on the right although the vein is not especially dilated.  I have encouraged him to avoid prolonged sitting and standing.  We have discussed the importance of exercise.  I encouraged him to continue to wear his compression stockings if he will be sitting or standing for a long period of time.  Again we discussed the importance of leg elevation.  I reassured him that he has no evidence of DVT.  Likewise he has palpable pedal pulses and no evidence of arterial insufficiency.  He will call if his symptoms in the right leg progress in which case we can reconsider laser ablation of the right great saphenous vein.   HPI:   Jacob Davila is a pleasant 47 y.o. male who recently had some abnormal labs and was found to have pancreatitis.  He was also having some issues with leg swelling and for this reason was sent for vascular consultation.  I had previously performed laser ablation of the left great saphenous vein with 10-20 stabs on the patient back in May 2023.  He states that his swelling has now improved.  He has been wearing his compression stocking some.  He denies any claudication or rest pain.  Past Medical History:  Diagnosis Date   Allergy    Scoliosis    - s/p correctvie Sgx   Tachycardia    Venous insufficiency of both lower extremities 05/2021   Lower extremity venous Doppler 05/29/2021: No evidence of DVT.  Venous reflux noted in the right GSV in the calf.  Venous reflux noted in the left common femoral vein and left greater saphenous vein in the thigh.  Left GSV in the calf as well as a left perforator vein. => Forwarded to vascular surgery:    Family History  Problem Relation Age of Onset   Atrial fibrillation Mother    Heart  failure Mother    Hypertension Father    Hyperlipidemia Father    Colon cancer Maternal Aunt    Colon cancer Paternal Uncle    CAD Paternal Grandfather        noted to have Angina (not aware of details)   Stomach cancer Neg Hx    Esophageal cancer Neg Hx    Rectal cancer Neg Hx    Pancreatic cancer Neg Hx     SOCIAL HISTORY: Social History   Tobacco Use   Smoking status: Some Days    Types: Cigars   Smokeless tobacco: Never  Substance Use Topics   Alcohol use: Yes    Comment: Occ    Allergies  Allergen Reactions   Allegra [Fexofenadine] Other (See Comments)    Blood rushes out of head and faints   Darvon [Propoxyphene] Itching    Made skin feel like it was crawling    Tessalon [Benzonatate] Itching    Made skin feel like it was crawling    Latex Dermatitis and Rash   Morphine And Codeine Rash    Current Outpatient Medications  Medication Sig Dispense Refill   furosemide (LASIX) 20 MG tablet Take 20 mg daily as needed for weight gain of 3 lb overnight or 5 lb in 1 week. 90 tablet 3   ibuprofen (ADVIL) 100 MG tablet Take 100 mg by mouth every  6 (six) hours as needed for fever.     metoprolol succinate (TOPROL XL) 25 MG 24 hr tablet Take 1.5 tablets (37.5 mg total) by mouth at bedtime. 180 tablet 2   Multiple Vitamin (MULTIVITAMIN) tablet Take 1 tablet by mouth daily.     omeprazole (PRILOSEC) 40 MG capsule Take 1 capsule (40 mg total) by mouth in the morning and at bedtime. 112 capsule 0   Current Facility-Administered Medications  Medication Dose Route Frequency Provider Last Rate Last Admin   0.9 %  sodium chloride infusion  500 mL Intravenous Once Imogene Burn, MD        REVIEW OF SYSTEMS:  [X]  denotes positive finding, [ ]  denotes negative finding Cardiac  Comments:  Chest pain or chest pressure:    Shortness of breath upon exertion:    Short of breath when lying flat:    Irregular heart rhythm:        Vascular    Pain in calf, thigh, or hip brought on  by ambulation:    Pain in feet at night that wakes you up from your sleep:     Blood clot in your veins:    Leg swelling:   x       Pulmonary    Oxygen at home:    Productive cough:     Wheezing:         Neurologic    Sudden weakness in arms or legs:     Sudden numbness in arms or legs:     Sudden onset of difficulty speaking or slurred speech:    Temporary loss of vision in one eye:     Problems with dizziness:         Gastrointestinal    Blood in stool:     Vomited blood:         Genitourinary    Burning when urinating:     Blood in urine:        Psychiatric    Major depression:         Hematologic    Bleeding problems:    Problems with blood clotting too easily:        Skin    Rashes or ulcers:        Constitutional    Fever or chills:     PHYSICAL EXAM:   Vitals:   06/07/23 1000  BP: 105/69  Pulse: 74  Resp: 16  Temp: 98 F (36.7 C)  TempSrc: Temporal  SpO2: 97%  Weight: 162 lb (73.5 kg)  Height: 5\' 11"  (1.803 m)    GENERAL: The patient is a well-nourished male, in no acute distress. The vital signs are documented above. CARDIAC: There is a regular rate and rhythm.  VASCULAR: I do not detect carotid bruits. He has palpable dorsalis pedis and posterior tibial pulses bilaterally. PULMONARY: There is good air exchange bilaterally without wheezing or rales. ABDOMEN: Soft and non-tender with normal pitched bowel sounds.  MUSCULOSKELETAL: There are no major deformities or cyanosis. NEUROLOGIC: No focal weakness or paresthesias are detected. SKIN: There are no ulcers or rashes noted. PSYCHIATRIC: The patient has a normal affect.  DATA:    VENOUS duplex: I reviewed his venous duplex scan that was done yesterday.  On the right side, he had no evidence of DVT.  He had deep venous reflux in the common femoral, femoral vein, and popliteal vein.  He had superficial venous reflux in the right great saphenous vein which had diameters ranging from  2.9-4.1  mm.  On the left side, he had no evidence of DVT.  He had deep venous reflux.  His left great saphenous vein has been previously ablated.  There was no other significant superficial venous reflux.  Waverly Ferrari [facility] [phone]

## 2023-06-12 ENCOUNTER — Ambulatory Visit: Payer: Self-pay | Admitting: Physician Assistant

## 2023-06-21 ENCOUNTER — Telehealth (HOSPITAL_COMMUNITY): Payer: Self-pay | Admitting: Nurse Practitioner

## 2023-06-21 NOTE — Telephone Encounter (Signed)
Patient cancelled echocardiogram due to he does not have insurance and unable to afford. He will call us back when he gets insurance. Order will be removed from the active echo WQ. Thank you

## 2023-06-23 ENCOUNTER — Ambulatory Visit (HOSPITAL_COMMUNITY): Payer: Self-pay

## 2023-06-27 IMAGING — US US ABDOMEN COMPLETE
1 series · 15 of 25 positions shown · non-contrast
Comparison: Abdominal ultrasound 06/24/2021

CLINICAL DATA: Elevated LFTs

EXAM:
ABDOMEN ULTRASOUND COMPLETE

[Series 1: us abdomen complete mc & wl · 15 of 103 slices shown]
[im 1/103]
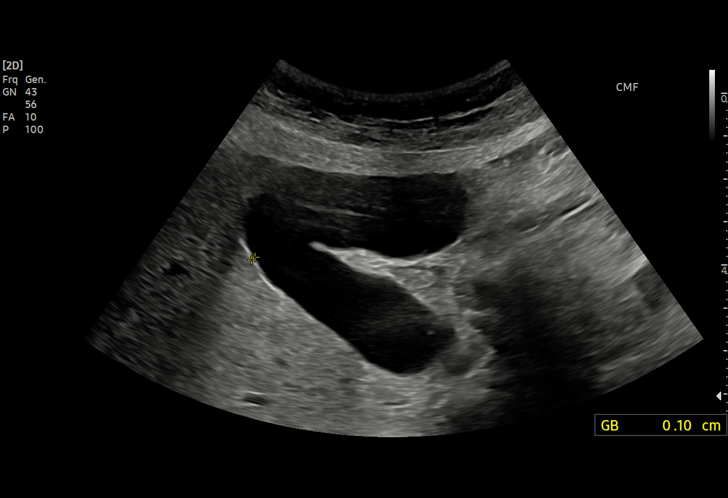
[im 9/103]
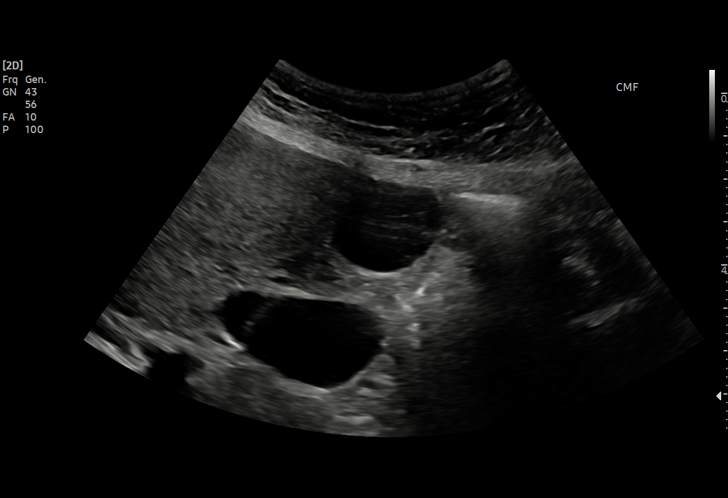
[im 18/103]
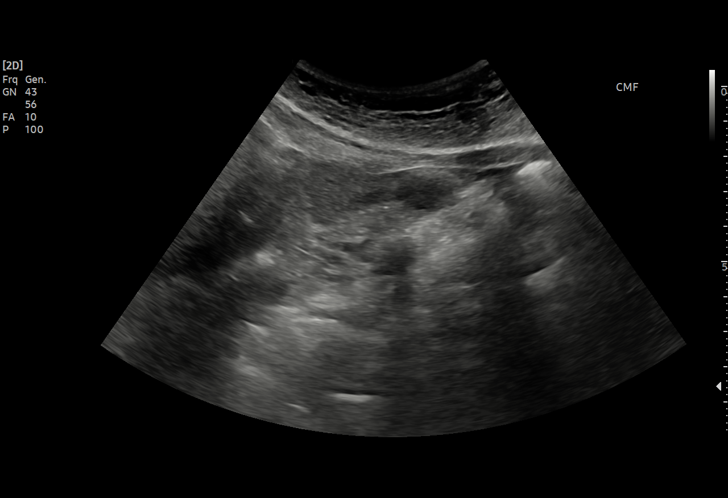
[im 22/103]
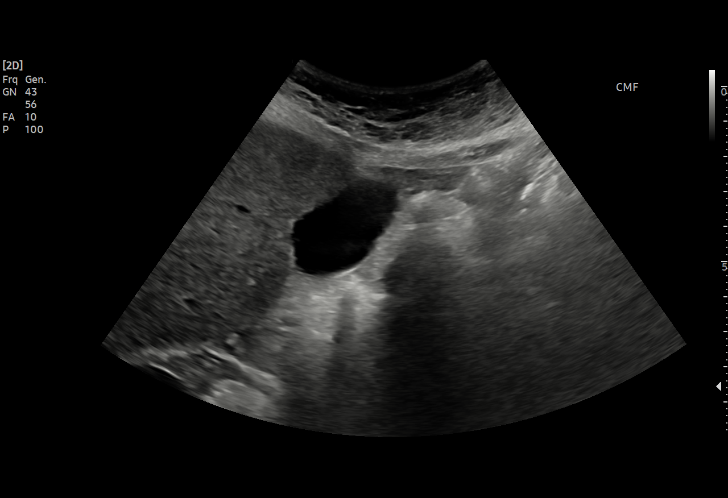
[im 30/103]
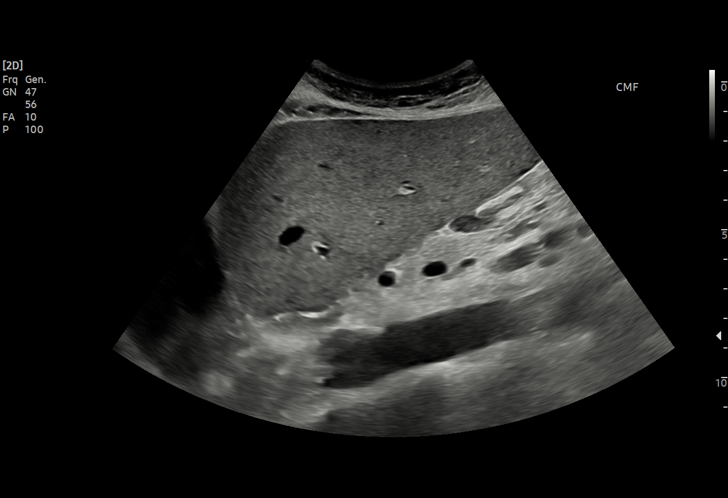
[im 39/103]
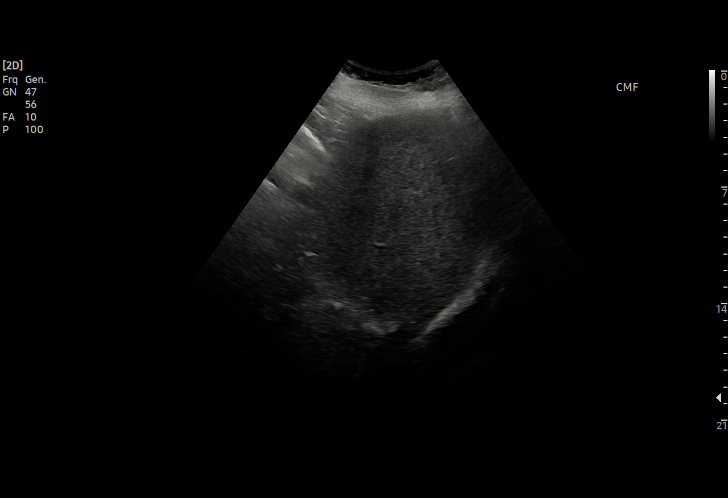
[im 43/103]
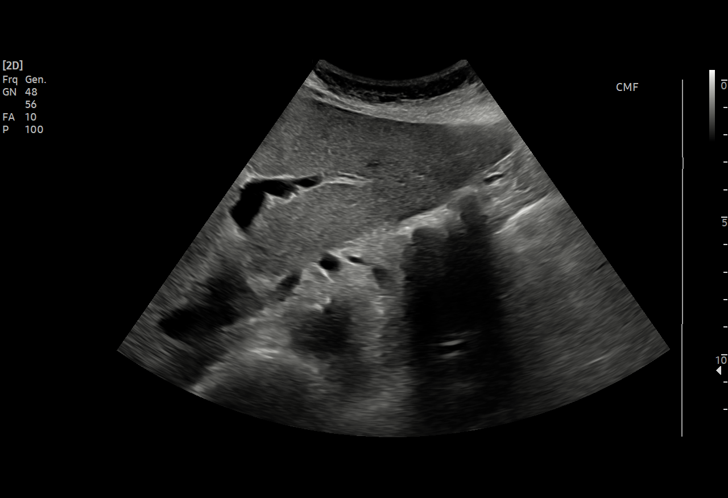
[im 52/103]
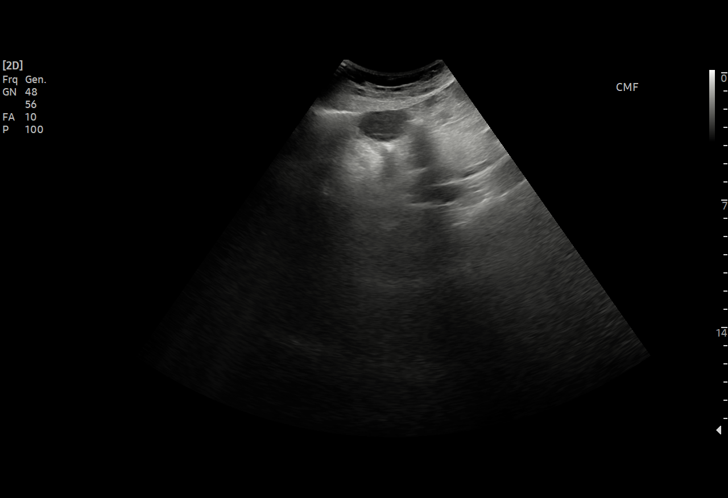
[im 60/103]
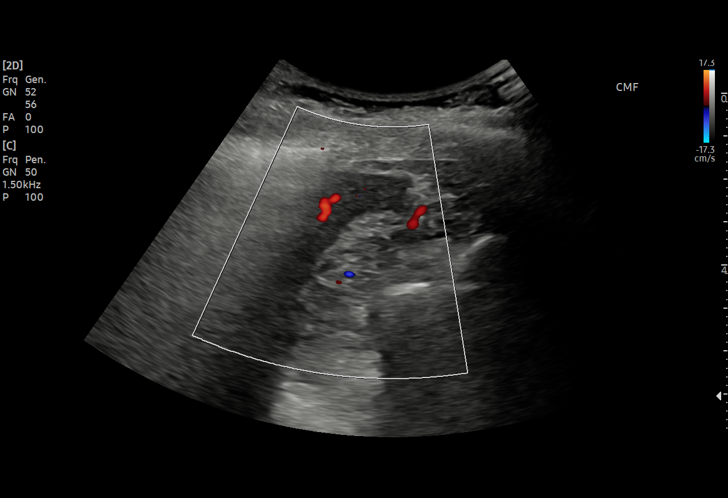
[im 64/103]
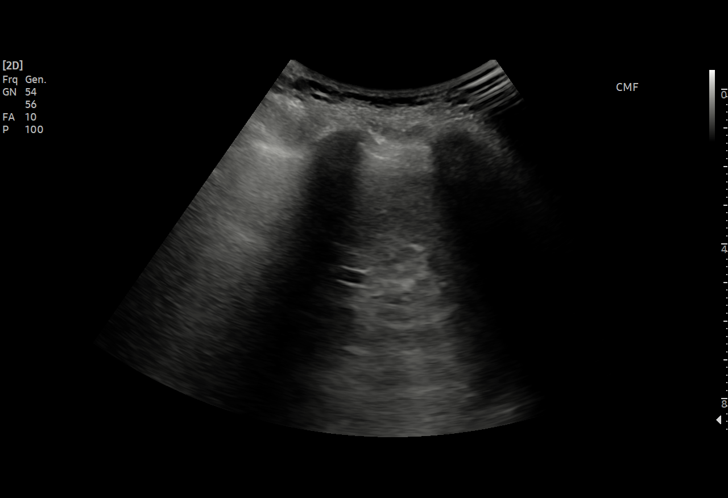
[im 73/103]
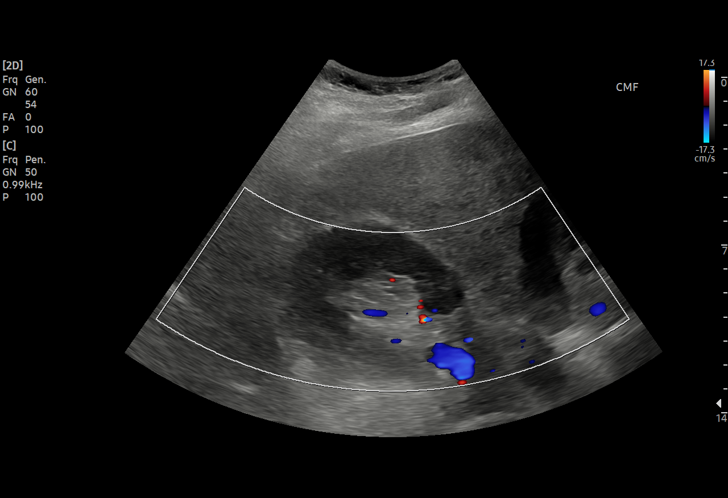
[im 81/103]
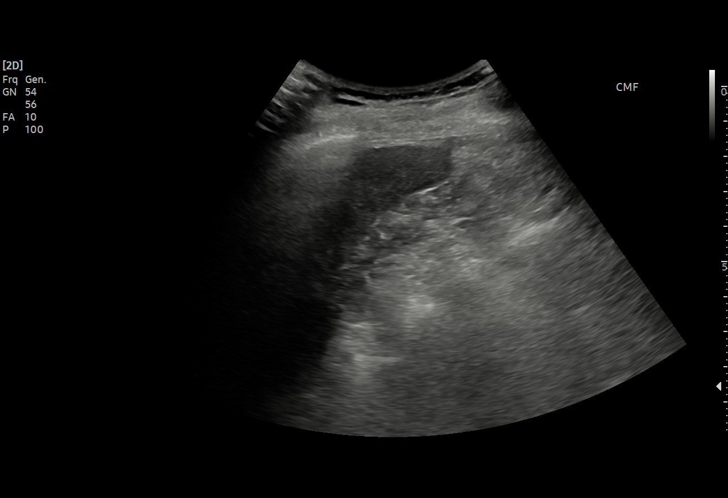
[im 86/103]
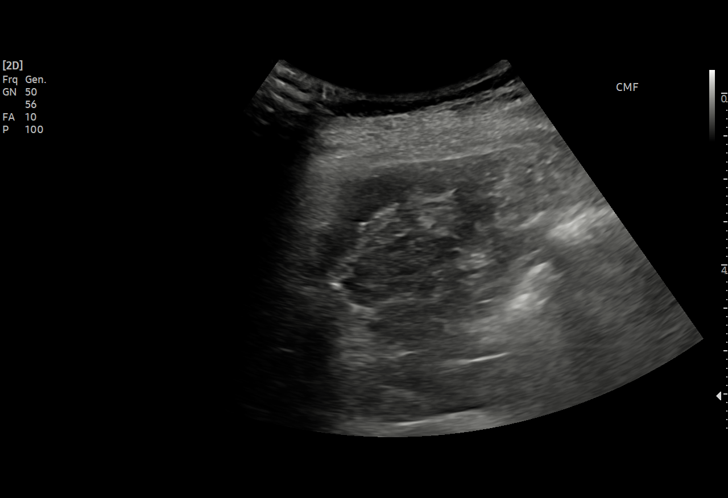
[im 94/103]
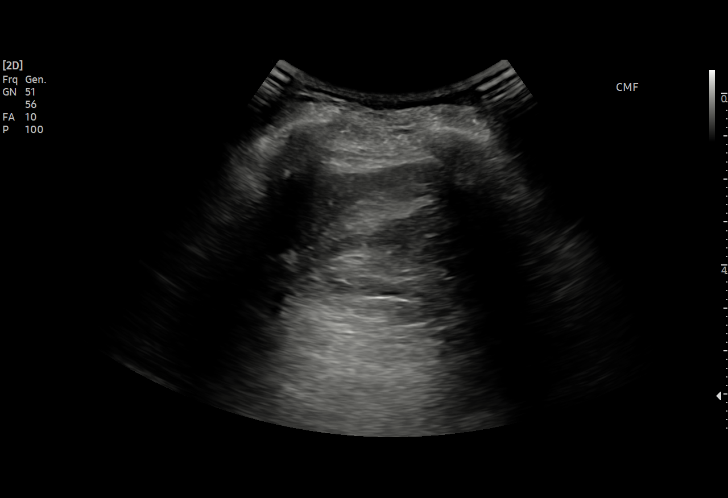
[im 103/103]
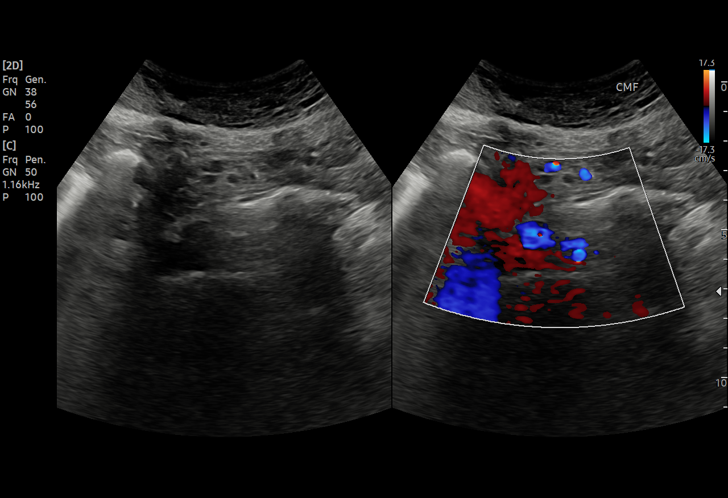

[15 of 25 positions shown; findings below may reference images not displayed]

FINDINGS: Gallbladder: No gallstones or wall thickening visualized. No
sonographic Murphy sign noted by sonographer.

Common bile duct: Diameter: 0.3 cm, within normal limits

Liver: No focal lesion identified. Increased liver parenchymal
echogenicity. Portal vein is patent on color Doppler imaging with
normal direction of blood flow towards the liver.

IVC: No abnormality visualized.

Pancreas: Visualized portion unremarkable.

Spleen: Partially obscured.  Visualized portion within normal limits

Right Kidney: Length: 13.3 cm. Echogenicity within normal limits. No
mass or hydronephrosis visualized.

Left Kidney: Poorly visualized due to shadowing bowel gas making
measurements difficult. No definite mass or hydronephrosis.

Abdominal aorta: No aneurysm visualized.

Other findings: None.
IMPRESSION: 1. Increased liver parenchymal echogenicity which is nonspecific but
most commonly seen with hepatic steatosis.

2. Limited visualization of the spleen and left kidney due to
shadowing bowel gas.

## 2023-07-13 ENCOUNTER — Encounter (HOSPITAL_BASED_OUTPATIENT_CLINIC_OR_DEPARTMENT_OTHER): Payer: Self-pay | Admitting: Family Medicine

## 2023-07-13 ENCOUNTER — Ambulatory Visit (INDEPENDENT_AMBULATORY_CARE_PROVIDER_SITE_OTHER): Payer: Medicaid Other | Admitting: Family Medicine

## 2023-07-13 VITALS — BP 107/74 | HR 80 | Temp 98.2°F | Ht 71.0 in | Wt 160.7 lb

## 2023-07-13 DIAGNOSIS — H8113 Benign paroxysmal vertigo, bilateral: Secondary | ICD-10-CM

## 2023-07-13 MED ORDER — METHYLPREDNISOLONE 4 MG PO TBPK: ORAL_TABLET | ORAL | 0 refills | Status: DC

## 2023-07-13 MED ORDER — FLUTICASONE PROPIONATE 50 MCG/ACT NA SUSP: 2.0000 | Freq: Every day | NASAL | 6 refills | Status: DC

## 2023-07-13 NOTE — Progress Notes (Signed)
Acute Care Office Visit  Subjective:   Jacob Davila October 14, 1976 07/13/2023  Chief Complaint  Patient presents with   Ear Pain    Pt states he began having bilateral ear pain which began 5 days ago. Denies any fever that he knows of. Has taken ibuprofen to see if it would help which he states has not helped much.    HPI: OTALGIA: Onset: 5 days ago  Location: Bilateral ears    Sensation of fullness:  yes Ear discharge:  no URI symptoms:  yes Fever:   no Tinnitus: yes Dizziness:  yes Hearing loss:  no Headache:  yes Toothache:  no Rash:  no  Treatments tried: Ibuprofen   Red Flags Recent trauma:  no PMH recurrent OM:  no PMH prior ear surgery: no Tubes currently:  no Recent antibiotic usage (last 30 days): no Diabetes or Immunosuppresion:   no    The following portions of the patient's history were reviewed and updated as appropriate: past medical history, past surgical history, family history, social history, allergies, medications, and problem list.   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  Venous reflux noted in the right GSV in the calf.  Venous reflux noted in the left common femoral vein and left greater saphenous  vein in the thigh.  Left GSV in the calf as well as a left perforator vein. => Forwarded to vascular surgery:   Past Surgical History:  Procedure Laterality Date   BACK SURGERY  03/1990   Scoliosis   COLONOSCOPY     ENDOVENOUS ABLATION SAPHENOUS VEIN W/ LASER Left 04/20/2022   endovenous laser ablation left greater saphenous vein and stab phlebectomy 10-20 incisions left leg by Cari Caraway MD   TRANSTHORACIC ECHOCARDIOGRAM  06/08/2019   EF 60-65%.  Normal wall motion.  Normal relaxation.  Mild aortic valve thickening but no sclerosis/stenosis.  Normal echo   UPPER GASTROINTESTINAL ENDOSCOPY  02/21/2022   Leonides Schanz   Family History  Problem Relation Age of Onset   Atrial fibrillation Mother    Heart failure Mother    Hypertension Father    Hyperlipidemia Father    Colon cancer Maternal Aunt    Colon cancer Paternal Uncle    CAD Paternal Grandfather        noted to have Angina (not aware of details)   Stomach cancer Neg Hx    Esophageal cancer Neg Hx    Rectal cancer Neg Hx    Pancreatic cancer Neg Hx    Outpatient Medications Prior to Visit  Medication Sig Dispense Refill   furosemide (LASIX) 20 MG tablet Take 20 mg daily as needed for weight gain of 3 lb overnight or 5 lb in 1 week. 90 tablet 3  ibuprofen (ADVIL) 100 MG tablet Take 100 mg by mouth every 6 (six) hours as needed for fever.     metoprolol succinate (TOPROL XL) 25 MG 24 hr tablet Take 1.5 tablets (37.5 mg total) by mouth at bedtime. 180 tablet 2   Multiple Vitamin (MULTIVITAMIN) tablet Take 1 tablet by mouth daily.     omeprazole (PRILOSEC) 40 MG capsule Take 1 capsule (40 mg total) by mouth in the morning and at bedtime. 112 capsule 0   Facility-Administered Medications Prior to Visit  Medication Dose Route Frequency Provider Last Rate Last Admin   0.9 %  sodium chloride infusion  500 mL Intravenous Once Imogene Burn, MD       Allergies  Allergen Reactions   Allegra [Fexofenadine] Other (See Comments)     Blood rushes out of head and faints   Darvon [Propoxyphene] Itching    Made skin feel like it was crawling    Tessalon [Benzonatate] Itching    Made skin feel like it was crawling    Latex Dermatitis and Rash   Morphine And Codeine Rash     ROS: A complete ROS was performed with pertinent positives/negatives noted in the HPI. The remainder of the ROS are negative.    Objective:   Today's Vitals   07/13/23 1342  BP: 107/74  Pulse: 80  Temp: 98.2 F (36.8 C)  TempSrc: Oral  SpO2: 98%  Weight: 160 lb 11.2 oz (72.9 kg)  Height: 5\' 11"  (1.803 m)    GENERAL: Well-appearing, in NAD. Well nourished.  SKIN: Pink, warm and dry. No rash, lesion, ulceration, or ecchymoses.  Head: Normocephalic. NECK: Trachea midline. Full ROM w/o pain or tenderness. No lymphadenopathy.  EARS: Tympanic membranes are intact, translucent without bulging and without drainage. Appropriate landmarks visualized. Mild injection present bilaterally.  EYES: Conjunctiva clear without exudates. EOMI, PERRL, no drainage present.  NOSE: Septum midline w/o deformity. Nares patent, mucosa pink and non-inflamed w/clear  drainage. No sinus tenderness.  THROAT: Uvula midline. Oropharynx clear. Mucous membranes pink and moist.  RESPIRATORY: Chest wall symmetrical. Respirations even and non-labored. Breath sounds clear to auscultation bilaterally.  CARDIAC: S1, S2 present, regular rate and rhythm without murmur or gallops. Peripheral pulses 2+ bilaterally.  MSK: Muscle tone and strength appropriate for age. Joints w/o tenderness, redness, or swelling.  EXTREMITIES: Without clubbing, cyanosis, or edema.  NEUROLOGIC: Steady, even gait. C2-C12 intact. + Gilberto Better bilaterally PSYCH/MENTAL STATUS: Alert, oriented x 3. Cooperative, appropriate mood and affect.    No results found for any visits on 07/13/23.    Assessment & Plan:  1. BPPV (benign paroxysmal positional vertigo), bilateral Patient recommended to start Medrol  Dosepak for 6 days and use Flonase as directed.  Epley maneuvers reviewed and recommended to perform twice daily.  Discussed possible causes and antihistamine use if needed.  Recommend following up with PCP if no improvement in 48 hours.   Meds ordered this encounter  Medications   methylPREDNISolone (MEDROL DOSEPAK) 4 MG TBPK tablet    Sig: Day 1: Take 2 tablets with breakfast, 2 tablets with lunch, and 2 tablets with supper. Day 2: Take 2 tablets with brekfast, 2 tablets with lunch and 1 tablet with supper. Day 3: Take 2 tablets with breakfast, 1 tablet with lunch and 1 tablet with supper. Day 4: Take 1 tablet with breakfast, 1 tablet with lunch and 1 tablet with supper. Day 5: Take 1 tablet with breakfast and 1 tablet with supper. Day 6: Take 1 tablet with  breakfast.    Dispense:  21 tablet    Refill:  0    Order Specific Question:   Supervising Provider    Answer:   DE [place], RAYMOND J [[id number]]   fluticasone (FLONASE) 50 MCG/ACT nasal spray    Sig: Place 2 sprays into both nostrils daily.    Dispense:  16 g    Refill:  6    Order Specific Question:   Supervising Provider    Answer:   DE [place], RAYMOND J [[id number]]   Lab Orders  No laboratory test(s) ordered today   No images are attached to the encounter or orders placed in the encounter.  Return if symptoms worsen or fail to improve.    Patient to reach out to office if new, worrisome, or unresolved symptoms arise or if no improvement in patient's condition. Patient verbalized understanding and is agreeable to treatment plan. All questions answered to patient's satisfaction.   Of note, portions of this note may have been created with voice recognition software [employer]ic[employer], medical). While this note has been edited for accuracy, occasional wrong-word or 'sound-a-like' substitutions may have occurred due to the inherent limitations of voice recognition software.  Yolanda Manges, FNP

## 2023-08-03 ENCOUNTER — Ambulatory Visit: Payer: Medicaid Other | Admitting: Cardiology

## 2023-08-21 ENCOUNTER — Other Ambulatory Visit (HOSPITAL_BASED_OUTPATIENT_CLINIC_OR_DEPARTMENT_OTHER): Payer: Medicaid Other

## 2023-08-22 LAB — COMPREHENSIVE METABOLIC PANEL
ALT: 59 [IU]/L — ABNORMAL HIGH (ref 0–44)
AST: 92 [IU]/L — ABNORMAL HIGH (ref 0–40)
Albumin: 4.2 g/dL (ref 4.1–5.1)
Alkaline Phosphatase: 89 [IU]/L (ref 44–121)
BUN/Creatinine Ratio: 10 (ref 9–20)
BUN: 12 mg/dL (ref 6–24)
Bilirubin Total: 0.3 mg/dL (ref 0.0–1.2)
CO2: 22 mmol/L (ref 20–29)
Calcium: 9 mg/dL (ref 8.7–10.2)
Chloride: 106 mmol/L (ref 96–106)
Creatinine, Ser: 1.23 mg/dL (ref 0.76–1.27)
Globulin, Total: 2.5 g/dL (ref 1.5–4.5)
Glucose: 78 mg/dL (ref 70–99)
Potassium: 4.3 mmol/L (ref 3.5–5.2)
Sodium: 144 mmol/L (ref 134–144)
Total Protein: 6.7 g/dL (ref 6.0–8.5)
eGFR: 72 mL/min/{1.73_m2} (ref >59)

## 2023-08-22 LAB — CBC WITH DIFFERENTIAL/PLATELET
Basophils Absolute: 0.1 10*3/uL (ref 0.0–0.2)
Basos: 2 %
EOS (ABSOLUTE): 0.1 10*3/uL (ref 0.0–0.4)
Eos: 3 %
Hematocrit: 38.7 % (ref 37.5–51.0)
Hemoglobin: 12.2 g/dL — ABNORMAL LOW (ref 13.0–17.7)
Immature Grans (Abs): 0 10*3/uL (ref 0.0–0.1)
Immature Granulocytes: 0 %
Lymphocytes Absolute: 2.1 10*3/uL (ref 0.7–3.1)
Lymphs: 50 %
MCH: 34.8 pg — ABNORMAL HIGH (ref 26.6–33.0)
MCHC: 31.5 g/dL (ref 31.5–35.7)
MCV: 110 fL — ABNORMAL HIGH (ref 79–97)
Monocytes Absolute: 0.5 10*3/uL (ref 0.1–0.9)
Monocytes: 12 %
Neutrophils Absolute: 1.4 10*3/uL (ref 1.4–7.0)
Neutrophils: 33 %
Platelets: 250 10*3/uL (ref 150–450)
RBC: 3.51 x10E6/uL — ABNORMAL LOW (ref 4.14–5.80)
RDW: 12.3 % (ref 11.6–15.4)
WBC: 4.3 10*3/uL (ref 3.4–10.8)

## 2023-08-22 LAB — AMYLASE: Amylase: 138 U/L — ABNORMAL HIGH (ref 31–110)

## 2023-08-22 LAB — GAMMA GT: GGT: 316 [IU]/L — ABNORMAL HIGH (ref 0–65)

## 2023-08-22 LAB — LIPASE: Lipase: 126 U/L — ABNORMAL HIGH (ref 13–78)

## 2023-08-22 LAB — BRAIN NATRIURETIC PEPTIDE: BNP: 32.1 pg/mL (ref 0.0–100.0)

## 2023-08-29 ENCOUNTER — Encounter (HOSPITAL_BASED_OUTPATIENT_CLINIC_OR_DEPARTMENT_OTHER): Payer: Self-pay | Admitting: Family Medicine

## 2023-08-29 ENCOUNTER — Ambulatory Visit (INDEPENDENT_AMBULATORY_CARE_PROVIDER_SITE_OTHER): Payer: Medicaid Other | Admitting: Family Medicine

## 2023-08-29 VITALS — BP 129/83 | HR 95 | Ht 71.0 in | Wt 161.0 lb

## 2023-08-29 DIAGNOSIS — A084 Viral intestinal infection, unspecified: Secondary | ICD-10-CM | POA: Diagnosis not present

## 2023-08-29 MED ORDER — ONDANSETRON HCL 4 MG PO TABS: 4.0000 mg | ORAL_TABLET | Freq: Three times a day (TID) | ORAL | 0 refills | Status: DC | PRN

## 2023-08-29 NOTE — Progress Notes (Signed)
Acute Office Visit  Subjective:     Patient ID: Jacob Davila, male    DOB: 27-Mar-1976, 47 y.o.   MRN: [id number]   Chief Complaint  Patient presents with   Diarrhea    Started on Sunday night   Chills    Mentions he may have had a fever on Monday morning, tried to eat crackers and drink water    Presents today for an acute visit with complaint of diarrhea and chills.  Symptoms have been present since Sunday night- it started with diarrhea and chills. Reports he was sweating, feels fatigued, and is very nauseous but no episodes of emesis. He reports that he is still experiencing chills. He reports he feels nauseous after eating but has been able to stay hydrated.  Sick contacts : none he is aware of   Review of Systems  Constitutional:  Positive for chills and malaise/fatigue. Negative for fever.  HENT:  Negative for congestion, ear pain and sore throat.   Respiratory:  Negative for cough and shortness of breath.   Cardiovascular:  Negative for chest pain, palpitations and leg swelling.  Gastrointestinal:  Positive for abdominal pain, diarrhea and nausea. Negative for constipation, heartburn and vomiting.  Genitourinary:  Negative for frequency and urgency.  Musculoskeletal:  Negative for back pain, myalgias and neck pain.  Neurological:  Negative for dizziness, weakness and headaches.  Psychiatric/Behavioral:  Negative for depression. The patient is not nervous/anxious.      Objective:    BP 129/83   Pulse 95   Ht 5\' 11"  (1.803 m)   Wt 161 lb (73 kg)   SpO2 98%   BMI 22.45 kg/m   Physical Exam Constitutional:      Appearance: Normal appearance.  Cardiovascular:     Rate and Rhythm: Normal rate and regular rhythm.     Pulses: Normal pulses.     Heart sounds: Normal heart sounds.  Pulmonary:     Effort: Pulmonary effort is normal.     Breath sounds: Normal breath sounds.  Abdominal:     General: Bowel sounds are normal.     Palpations: Abdomen is soft.      Tenderness: There is no abdominal tenderness. There is no guarding or rebound.  Neurological:     Mental Status: He is alert.  Psychiatric:        Mood and Affect: Mood normal.        Behavior: Behavior normal.    Assessment & Plan:   1. Viral gastroenteritis Patient presents today ill-appearing and in no acute distress. Denies chest pain, shortness of breath, lower extremity edema, vision changes, headaches. Cardiovascular exam with heart regular rate and rhythm. Normal heart sounds, no murmurs present. No lower extremity edema present. Lungs clear to auscultation bilaterally. Abdomen soft with normal bowel sounds. Patient reports he has been feeling nauseous and has been experiencing diarrhea. Discussed most likely viral gastroenteritis and currently focus on symptomatic treatment at this time. Counseled patient that symptoms will last 3-7 days but can stretch out to 10-14 days. Also reasonable to to use an over the counter Tylenol to help with cough, pain, and fevers/chills. Warm fluids with honey are also helpful. Drink plenty of fluids and stay hydrated. Advised patient to call office if no improvement in nausea by the end of this week.   - ondansetron (ZOFRAN) 4 MG tablet; Take 1 tablet (4 mg total) by mouth every 8 (eight) hours as needed for nausea or vomiting.  Dispense: 20 tablet;  Refill: 0  Return if symptoms worsen or fail to improve.  Alyson Reedy, FNP

## 2023-08-29 NOTE — Patient Instructions (Addendum)
Non-Medication Therapy:  Drink plenty of fluids, warm if possible. Make sure you are staying hydrated.  A teaspoon of honey may help ease coughing symptoms.  Cough drops or hard candy for coughing.   Over the Counter Medication Therapy:  Use a cough expectorant such as guaifenesin (Mucinex) if recommended by your doctor for a wet, congested cough. If you have high blood pressure, please ask your doctor first before using this.   Use a cough suppressant such as dextromethorphan (Robitussin/Delsym) for a dry cough. If you have high blood pressure, please ask your doctor first before using this.   Use Tylenol (acetaminophen) and/or Advil (ibuprofen) for fever *unless allergic or contraindicated*   Use Imodium for diarrhea  Take zofran for nausea

## 2023-09-08 ENCOUNTER — Encounter: Payer: Self-pay | Admitting: Internal Medicine

## 2023-09-08 ENCOUNTER — Ambulatory Visit: Payer: Medicaid Other | Admitting: Internal Medicine

## 2023-09-08 VITALS — BP 102/80 | HR 68 | Ht 71.0 in | Wt 160.0 lb

## 2023-09-08 DIAGNOSIS — R1310 Dysphagia, unspecified: Secondary | ICD-10-CM | POA: Diagnosis not present

## 2023-09-08 DIAGNOSIS — K760 Fatty (change of) liver, not elsewhere classified: Secondary | ICD-10-CM | POA: Diagnosis not present

## 2023-09-08 DIAGNOSIS — R7989 Other specified abnormal findings of blood chemistry: Secondary | ICD-10-CM | POA: Diagnosis not present

## 2023-09-08 DIAGNOSIS — K219 Gastro-esophageal reflux disease without esophagitis: Secondary | ICD-10-CM | POA: Diagnosis not present

## 2023-09-08 MED ORDER — FAMOTIDINE 20 MG PO TABS
20.0000 mg | ORAL_TABLET | Freq: Two times a day (BID) | ORAL | 1 refills | Status: DC
Start: 2023-09-08 — End: 2024-01-08

## 2023-09-08 NOTE — Progress Notes (Signed)
Chief Complaint: Elevated LFTs  HPI: 47 year old male with past medical history significant for hepatic steatosis with elevated LFTs, paroxysmal tachycardia, macrocytic anemia, presents for follow up of elevated LFTs  Labs 12/2021: Normal iron, ferritin, ANA, alpha-1 antitrypsin, ceruloplasmin, mitochondrial antibody, ASMA, and IgG.  Liver biopsy 02/2022 showed minimally active steatohepatitis and stage 2/3 liver fibrosis..  Thought to be due to possible alcoholic liver disease, nonalcoholic fatty liver disease, or possible exposure to isopropyl alcohol via inhalation per his work (less likely).  RUQ U/S 05/27/23: 1. Diffusely increased hepatic echogenicity without focal lesion. Imaging features suggest hepatic steatosis. 2. No evidence for cholelithiasis or biliary ductal dilatation.  CT abdomen pelvis with contrast 05/27/23 shows decreased hepatic density typically with hepatic steatosis, unremarkable gallbladder, marked atrophic left kidney (nonfunctional), submucosal fatty infiltration of cecum, ascending and proximal transverse colon can be seen with chronic inflammation.  Interval History: He stopped working with isopropyl alcohol a few months ago. 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 Denies alcohol use. Last drink of alcohol was 1.5 months but it is much less than he was using. He will feel a tightness in his chest at times. After he eats two bites, he will feel full.  He does feel like the omeprazole medication has significantly helped with his chest tightness and dysphagia. He is eating two meals per day and is working up to eating 3 meals per day. He had severe ab pain and bloating back in 05/2023 for which he went to the ED. His CT showed colitis at that time, which is most likely due to infection since his last colonoscopy in 2023 was fairly unremarkable. His ab pain, diarrhea, and bloating have improved. Last week he had 3 days of diarrhea, which was attributed to a  GI infection. He has been wearing compression stockings to help with some swelling in his legs. Denies confusion or blood in the stools.  He saw vascular surgery who attributed his BLE swelling to chronic venous insufficiency.  Wt Readings from Last 3 Encounters:  09/08/23 160 lb (72.6 kg)  08/29/23 161 lb (73 kg)  07/13/23 160 lb 11.2 oz (72.9 kg)    PREVIOUS GI WORKUP  RUQ ultrasound 06/24/2021: Mild increased echogenicity in the liver without focal mass (hepatic steatosis)  EGD 08/2022 done for follow-up of gastric ulcer: Mucosa suspicious for short segment Barrett's (squamocolumnar junctional mucosa with mild features suggestive of reflux, negative for intestinal metaplasia), erythematous mucosa in antrum, normal duodenum.  EGD 02/2022 for dysphagia: Nonbleeding gastric ulcers with pigmented material (reactive gastropathy with minimal chronic gastritis negative for H. pylori), many nonbleeding erosions in the middle third of esophagus and lower third of the esophagus, normal duodenum (reactive duodenal mucosa negative for intraepithelial lymphocytosis)  Colonoscopy 02/2022 for screening: Multiple diverticula in ascending, 2 tubular adenomas and sigmoid (3 to 5 mm in size), nonbleeding internal hemorrhoids   Past Medical History:  Diagnosis Date   Allergy    Scoliosis    - s/p correctvie Sgx   Tachycardia    Venous insufficiency of both lower extremities 05/2021   Lower extremity venous Doppler 05/29/2021: No evidence of DVT.  Venous reflux noted in the right GSV in the calf.  Venous reflux noted in the left common femoral vein and left greater saphenous vein in the thigh.  Left GSV in the calf as well as a left perforator vein. => Forwarded to vascular surgery:    Past Surgical History:  Procedure Laterality Date   BACK  SURGERY  03/1990   Scoliosis   COLONOSCOPY     ENDOVENOUS ABLATION SAPHENOUS VEIN W/ LASER Left 04/20/2022   endovenous laser ablation left greater saphenous vein and  stab phlebectomy 10-20 incisions left leg by Cari Caraway MD   TRANSTHORACIC ECHOCARDIOGRAM  06/08/2019   EF 60-65%.  Normal wall motion.  Normal relaxation.  Mild aortic valve thickening but no sclerosis/stenosis.  Normal echo   UPPER GASTROINTESTINAL ENDOSCOPY  02/21/2022   Leonides Schanz    Current Outpatient Medications  Medication Sig Dispense Refill   fluticasone (FLONASE) 50 MCG/ACT nasal spray Place 2 sprays into both nostrils daily. 16 g 6   furosemide (LASIX) 20 MG tablet Take 20 mg daily as needed for weight gain of 3 lb overnight or 5 lb in 1 week. 90 tablet 3   metoprolol succinate (TOPROL XL) 25 MG 24 hr tablet Take 1.5 tablets (37.5 mg total) by mouth at bedtime. 180 tablet 2   Multiple Vitamin (MULTIVITAMIN) tablet Take 1 tablet by mouth daily.     omeprazole (PRILOSEC) 40 MG capsule Take 1 capsule (40 mg total) by mouth in the morning and at bedtime. 112 capsule 0   ibuprofen (ADVIL) 100 MG tablet Take 100 mg by mouth every 6 (six) hours as needed for fever. (Patient not taking: Reported on 09/08/2023)     ondansetron (ZOFRAN) 4 MG tablet Take 1 tablet (4 mg total) by mouth every 8 (eight) hours as needed for nausea or vomiting. (Patient not taking: Reported on 09/08/2023) 20 tablet 0   No current facility-administered medications for this visit.    Allergies as of 09/08/2023 - Review Complete 09/08/2023  Allergen Reaction Noted   Allegra [fexofenadine] Other (See Comments) 08/11/2022   Darvon [propoxyphene] Itching 05/27/2021   Tessalon [benzonatate] Itching 05/27/2021   Latex Dermatitis and Rash 04/19/2021   Morphine and codeine Rash 04/19/2021    Family History  Problem Relation Age of Onset   Atrial fibrillation Mother    Heart failure Mother    Hypertension Father    Hyperlipidemia Father    Colon cancer Maternal Aunt    Colon cancer Paternal Uncle    CAD Paternal Grandfather        noted to have Angina (not aware of details)   Stomach cancer Neg Hx     Esophageal cancer Neg Hx    Rectal cancer Neg Hx    Pancreatic cancer Neg Hx     Social History   Socioeconomic History   Marital status: Single    Spouse name: Not on file   Number of children: Not on file   Years of education: Not on file   Highest education level: Not on file  Occupational History   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  He reports the only medication he takes is Benadryl, and that he takes approximately 2 tablets/day.  As a Charity fundraiser, he reports he is exposed to many chemicals, but is wearing his protective gear,  and reports that other chemists work with the most dangerous chemicals and have special training to do so.  He has never had symptoms like this before.     Social Determinants of Health   Financial Resource Strain: Not on file  Food Insecurity: Not on file  Transportation Needs: Not on file  Physical Activity: Not on file  Stress: Not on file  Social Connections: Not on file  Intimate Partner Violence: Not on file     Physical Exam:  Vital signs: BP 102/80   Pulse 68   Ht 5\' 11"  (1.803 m)   Wt 160 lb (72.6 kg)   BMI 22.32 kg/m   Constitutional: NAD, Well developed, Well nourished, alert and cooperative Head:  Normocephalic and atraumatic. Respiratory: Respirations even and unlabored. Lungs clear to auscultation bilaterally. Cardiovascular:  Regular rate and rhythm. No peripheral edema, cyanosis or pallor.  Compression socks on Gastrointestinal:  Soft, nondistended, nontender. No rebound or guarding. Normal bowel  sounds Neurologic:  Alert and  oriented  Skin:   Dry and intact without significant lesions or rashes. Psychiatric: Oriented to person, place and time. Demonstrates good judgement and reason without abnormal affect or behaviors.   RELEVANT LABS AND IMAGING: CBC    Component Value Date/Time   WBC 4.3 08/21/2023 0848   WBC 4.3 05/31/2023 1031   RBC 3.51 (L) 08/21/2023 0848   RBC 2.62 (L) 05/31/2023 1031   HGB 12.2 (L) 08/21/2023 0848   HCT 38.7 08/21/2023 0848   PLT 250 08/21/2023 0848   MCV 110 (H) 08/21/2023 0848   MCH 34.8 (H) 08/21/2023 0848   MCH 36.7 (H) 05/27/2023 1822   MCHC 31.5 08/21/2023 0848   MCHC 32.3 05/31/2023 1031   RDW 12.3 08/21/2023 0848   LYMPHSABS 2.1 08/21/2023 0848   MONOABS 0.5 05/31/2023 1031   EOSABS 0.1 08/21/2023 0848   BASOSABS 0.1 08/21/2023 0848    CMP     Component Value Date/Time   NA 144 08/21/2023 0848   K 4.3 08/21/2023 0848   CL 106 08/21/2023 0848   CO2 22 08/21/2023 0848   GLUCOSE 78 08/21/2023 0848   GLUCOSE 75 05/31/2023 1031   BUN 12 08/21/2023 0848   CREATININE 1.23 08/21/2023 0848   CALCIUM 9.0 08/21/2023 0848   PROT 6.7 08/21/2023 0848   ALBUMIN 4.2 08/21/2023 0848   AST 92 (H) 08/21/2023 0848   ALT 59 (H) 08/21/2023 0848   ALKPHOS 89 08/21/2023 0848   BILITOT 0.3 08/21/2023 0848   GFRNONAA >60 05/27/2023 1822    Assessment: Elevated LFTs Hepatic steatosis GERD Dysphagia Patient's LFTs have significantly improved with removal of exposure to isopropyl alcohol and decrease in his alcohol consumption as well.  Patient does still have some dysphagia that could be due to some uncontrolled reflux.  He has already had improvement with taking omeprazole twice daily.  I will add on Pepcid 20 mg twice daily to see if this helps further with his feelings of dysphagia.  Patient does mention today that he is considering a new job that may expose him to diluted chemicals.  I asked the patient to let me know if he is going to start  this new job and for [place] to get some baseline LFTs prior to his new job.  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 Consider elastography at next clinic visit  Eulah Pont, MD [facility] 09/08/2023, 10:28 AM  I spent 46 minutes of time, including in depth chart review, independent review of results as outlined above, communicating results with the patient directly, face-to-face time with the patient, coordinating care, ordering studies and medications as appropriate, and documentation.

## 2023-09-08 NOTE — Patient Instructions (Addendum)
Continue to avoid Alcohol Continue taking Omeprazole   You are scheduled for a follow up visit on 12/22/23 at 9:30 am  We have sent the following medications to your pharmacy for you to pick up at your convenience: Famotidine  If your blood pressure at your visit was 140/90 or greater, please contact your primary care physician to follow up on this.  _______________________________________________________  If you are age 48 or older, your body mass index should be between 23-30. Your Body mass index is 22.32 kg/m. If this is out of the aforementioned range listed, please consider follow up with your Primary Care Provider.  If you are age 53 or younger, your body mass index should be between 19-25. Your Body mass index is 22.32 kg/m. If this is out of the aformentioned range listed, please consider follow up with your Primary Care Provider.   ________________________________________________________  The [facility] providers would like to encourage you to use [facility] to communicate with providers for non-urgent requests or questions.  Due to long hold times on the telephone, sending your provider a message by [facility] may be a faster and more efficient way to get a response.  Please allow 48 business hours for a response.  Please remember that this is for non-urgent requests.  _______________________________________________________  Thank you for entrusting me with your care and for choosing [facility], Dr. Eulah Pont

## 2023-09-13 ENCOUNTER — Ambulatory Visit (HOSPITAL_BASED_OUTPATIENT_CLINIC_OR_DEPARTMENT_OTHER): Payer: Medicaid Other | Admitting: Family Medicine

## 2023-09-13 ENCOUNTER — Encounter (HOSPITAL_BASED_OUTPATIENT_CLINIC_OR_DEPARTMENT_OTHER): Payer: Self-pay | Admitting: Family Medicine

## 2023-09-13 VITALS — BP 118/60 | HR 82 | Ht 71.0 in | Wt 163.5 lb

## 2023-09-13 DIAGNOSIS — G4700 Insomnia, unspecified: Secondary | ICD-10-CM | POA: Insufficient documentation

## 2023-09-13 DIAGNOSIS — Z0000 Encounter for general adult medical examination without abnormal findings: Secondary | ICD-10-CM | POA: Insufficient documentation

## 2023-09-13 DIAGNOSIS — Z23 Encounter for immunization: Secondary | ICD-10-CM

## 2023-09-13 NOTE — Assessment & Plan Note (Signed)
Routine HCM labs ordered. HCM reviewed/discussed. Anticipatory guidance regarding healthy weight, lifestyle and choices given. Recommend healthy diet.  Recommend approximately 150 minutes/week of moderate intensity exercise Recommend regular dental and vision exams Always use seatbelt/lap and shoulder restraints Recommend using smoke alarms and checking batteries at least twice a year Recommend using sunscreen when outside Discussed colon cancer screening recommendations, options.  Patient is UTD Discussed tetanus immunization recommendations, patient agreed to proceed with this today

## 2023-09-13 NOTE — Progress Notes (Signed)
Subjective:    CC: Annual Physical Exam  HPI:  Jacob Davila is a 47 y.o. presenting for annual physical  I reviewed the past medical history, family history, social history, surgical history, and allergies today and no changes were needed.  Please see the problem list section below in epic for further details.  Past Medical History: Past Medical History:  Diagnosis Date   Allergy    Scoliosis    - s/p correctvie Sgx   Tachycardia    Venous insufficiency of both lower extremities 05/2021   Lower extremity venous Doppler 05/29/2021: No evidence of DVT.  Venous reflux noted in the right GSV in the calf.  Venous reflux noted in the left common femoral vein and left greater saphenous vein in the thigh.  Left GSV in the calf as well as a left perforator vein. => Forwarded to vascular surgery:   Past Surgical History: Past Surgical History:  Procedure Laterality Date   BACK SURGERY  03/1990   Scoliosis   COLONOSCOPY     ENDOVENOUS ABLATION SAPHENOUS VEIN W/ LASER Left 04/20/2022   endovenous laser ablation left greater saphenous vein and stab phlebectomy 10-20 incisions left leg by Cari Caraway MD   TRANSTHORACIC ECHOCARDIOGRAM  06/08/2019   EF 60-65%.  Normal wall motion.  Normal relaxation.  Mild aortic valve thickening but no sclerosis/stenosis.  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  He reports the only medication he takes is Benadryl, and that he takes approximately 2 tablets/day.  As a Charity fundraiser, he reports he is exposed to many chemicals, but is wearing his protective gear, and reports that other chemists work with the most dangerous chemicals and have special training to do so.  He has never had symptoms like this before.     Social Determinants of Health   Financial Resource Strain: Not on file  Food Insecurity: Not on file  Transportation Needs: Not on file  Physical Activity: Not on file  Stress: Not on file  Social Connections: Not on file   Family History: Family History  Problem Relation Age of Onset   Atrial fibrillation Mother    Heart failure Mother    Hypertension Father    Hyperlipidemia Father    Colon cancer Maternal Aunt    Colon cancer Paternal Uncle    CAD Paternal Grandfather        noted to have Angina (not aware of details)   Stomach cancer Neg Hx    Esophageal cancer Neg Hx    Rectal cancer Neg Hx    Pancreatic cancer Neg Hx  Allergies: Allergies  Allergen Reactions   Allegra [Fexofenadine] Other (See Comments)    Blood rushes out of head and faints   Darvon [Propoxyphene] Itching    Made skin feel like it was crawling    Tessalon [Benzonatate] Itching    Made skin feel like it was crawling    Latex Dermatitis and Rash   Morphine And Codeine Rash   Medications: See med rec.  Review of Systems: No headache, visual changes, nausea, vomiting, diarrhea, constipation, dizziness, abdominal pain, skin rash, fevers, chills, night sweats, swollen lymph nodes, weight loss, chest pain, body aches, joint swelling, muscle aches, shortness of breath, mood changes, visual or auditory hallucinations.  Objective:    BP 118/60 (BP Location: Right Arm, Patient Position: Sitting, Cuff Size: Normal)    Pulse 82   Ht 5\' 11"  (1.803 m)   Wt 163 lb 8 oz (74.2 kg)   SpO2 96%   BMI 22.80 kg/m   General: Well Developed, well nourished, and in no acute distress. Neuro: Alert and oriented x3, extra-ocular muscles intact, sensation grossly intact. Cranial nerves II through XII are intact, motor, sensory, and coordinative functions are all intact. HEENT: Normocephalic, atraumatic, pupils equal round reactive to light, neck supple, no masses, no lymphadenopathy, thyroid nonpalpable. Oropharynx, nasopharynx, external ear canals are unremarkable. Skin: Warm and dry, no rashes noted. Cardiac: Regular rate and rhythm, no murmurs rubs or gallops. Respiratory: Clear to auscultation bilaterally. Not using accessory muscles, speaking in full sentences. Abdominal: Soft, nontender, nondistended, positive bowel sounds, no masses, no organomegaly. Musculoskeletal: Shoulder, elbow, wrist, hip, knee, ankle stable, and with full range of motion.  Impression and Recommendations:    Wellness examination Assessment & Plan: Routine HCM labs ordered. HCM reviewed/discussed. Anticipatory guidance regarding healthy weight, lifestyle and choices given. Recommend healthy diet.  Recommend approximately 150 minutes/week of moderate intensity exercise Recommend regular dental and vision exams Always use seatbelt/lap and shoulder restraints Recommend using smoke alarms and checking batteries at least twice a year Recommend using sunscreen when outside Discussed colon cancer screening recommendations, options.  Patient is UTD Discussed tetanus immunization recommendations, patient agreed to proceed with this today  Orders: -     Comprehensive metabolic panel; Future -     CBC with Differential/Platelet; Future -     Lipid panel; Future -     TSH Rfx on Abnormal to Free T4; Future  Insomnia, unspecified type Assessment & Plan: Discussed general recommendations related sleep issues. Has more trouble with falling asleep  and staying asleep. We discussed general recommendations related to sleep hygiene, handout provided. Discussed considerations related to CBT as treatment option as well.   Return in about 6 months (around 03/13/2024).   ___________________________________________ Onda Kattner de [place], MD, ABFM, CAQSM Primary Care and Sports Medicine [facility]

## 2023-09-13 NOTE — Assessment & Plan Note (Addendum)
Discussed general recommendations related sleep issues. Has more trouble with falling asleep and staying asleep. We discussed general recommendations related to sleep hygiene, handout provided. Discussed considerations related to CBT as treatment option as well.

## 2023-09-13 NOTE — Addendum Note (Signed)
Addended by: Ishmael Holter R on: 09/13/2023 03:45 PM   Modules accepted: Orders

## 2023-09-20 ENCOUNTER — Ambulatory Visit: Payer: Medicaid Other | Admitting: Cardiology
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
## 2023-10-02 ENCOUNTER — Encounter: Payer: Self-pay | Admitting: Cardiology

## 2023-10-02 ENCOUNTER — Ambulatory Visit: Payer: Medicaid Other | Attending: Cardiology | Admitting: Cardiology

## 2023-10-02 VITALS — BP 114/68 | HR 72 | Ht 71.0 in | Wt 159.8 lb

## 2023-10-02 DIAGNOSIS — R198 Other specified symptoms and signs involving the digestive system and abdomen: Secondary | ICD-10-CM | POA: Diagnosis not present

## 2023-10-02 DIAGNOSIS — I479 Paroxysmal tachycardia, unspecified: Secondary | ICD-10-CM

## 2023-10-02 DIAGNOSIS — R600 Localized edema: Secondary | ICD-10-CM | POA: Diagnosis not present

## 2023-10-02 DIAGNOSIS — G479 Sleep disorder, unspecified: Secondary | ICD-10-CM

## 2023-10-02 DIAGNOSIS — K760 Fatty (change of) liver, not elsewhere classified: Secondary | ICD-10-CM

## 2023-10-02 DIAGNOSIS — R002 Palpitations: Secondary | ICD-10-CM | POA: Diagnosis not present

## 2023-10-02 DIAGNOSIS — I872 Venous insufficiency (chronic) (peripheral): Secondary | ICD-10-CM

## 2023-10-02 MED ORDER — FUROSEMIDE 20 MG PO TABS: ORAL_TABLET | ORAL | 3 refills | Status: DC

## 2023-10-02 MED ORDER — METOPROLOL SUCCINATE ER 25 MG PO TB24: 35.5000 mg | ORAL_TABLET | Freq: Every day | ORAL | 3 refills | Status: DC

## 2023-10-02 NOTE — Patient Instructions (Signed)
Medication Instructions:  No changes *If you need a refill on your cardiac medications before your next appointment, please call your pharmacy*  Follow-Up: At [facility], you and your health needs are our priority.  As part of our continuing mission to provide you with exceptional heart care, we have created designated Provider Care Teams.  These Care Teams include your primary Cardiologist (physician) and Advanced Practice Providers (APPs -  Physician Assistants and Nurse Practitioners) who all work together to provide you with the care you need, when you need it.  We recommend signing up for the patient portal called "MyChart".  Sign up information is provided on this After Visit Summary.  MyChart is used to connect with patients for Virtual Visits (Telemedicine).  Patients are able to view lab/test results, encounter notes, upcoming appointments, etc.  Non-urgent messages can be sent to your provider as well.   To learn more about what you can do with MyChart, go to [email or web address].    Your next appointment:   6 month(s)  Provider:   Dr Herbie Baltimore

## 2023-10-12 ENCOUNTER — Encounter (HOSPITAL_BASED_OUTPATIENT_CLINIC_OR_DEPARTMENT_OTHER): Payer: Self-pay | Admitting: Family Medicine

## 2023-10-16 ENCOUNTER — Ambulatory Visit: Payer: Medicaid Other | Admitting: Adult Health

## 2023-10-17 ENCOUNTER — Other Ambulatory Visit (HOSPITAL_BASED_OUTPATIENT_CLINIC_OR_DEPARTMENT_OTHER): Payer: Self-pay | Admitting: Family Medicine

## 2023-10-18 LAB — CBC WITH DIFFERENTIAL/PLATELET
Basophils Absolute: 0.1 10*3/uL (ref 0.0–0.2)
Basos: 1 %
EOS (ABSOLUTE): 0.1 10*3/uL (ref 0.0–0.4)
Eos: 3 %
Hematocrit: 32.6 % — ABNORMAL LOW (ref 37.5–51.0)
Hemoglobin: 10.9 g/dL — ABNORMAL LOW (ref 13.0–17.7)
Immature Grans (Abs): 0 10*3/uL (ref 0.0–0.1)
Immature Granulocytes: 0 %
Lymphocytes Absolute: 1.6 10*3/uL (ref 0.7–3.1)
Lymphs: 32 %
MCH: 35.7 pg — ABNORMAL HIGH (ref 26.6–33.0)
MCHC: 33.4 g/dL (ref 31.5–35.7)
MCV: 107 fL — ABNORMAL HIGH (ref 79–97)
Monocytes Absolute: 0.6 10*3/uL (ref 0.1–0.9)
Monocytes: 12 %
Neutrophils Absolute: 2.6 10*3/uL (ref 1.4–7.0)
Neutrophils: 52 %
Platelets: 207 10*3/uL (ref 150–450)
RBC: 3.05 x10E6/uL — ABNORMAL LOW (ref 4.14–5.80)
RDW: 14.1 % (ref 11.6–15.4)
WBC: 4.9 10*3/uL (ref 3.4–10.8)

## 2023-10-18 LAB — COMPREHENSIVE METABOLIC PANEL
ALT: 46 [IU]/L — ABNORMAL HIGH (ref 0–44)
AST: 59 [IU]/L — ABNORMAL HIGH (ref 0–40)
Albumin: 3.9 g/dL — ABNORMAL LOW (ref 4.1–5.1)
Alkaline Phosphatase: 81 [IU]/L (ref 44–121)
BUN/Creatinine Ratio: 10 (ref 9–20)
BUN: 11 mg/dL (ref 6–24)
Bilirubin Total: 0.8 mg/dL (ref 0.0–1.2)
CO2: 19 mmol/L — ABNORMAL LOW (ref 20–29)
Calcium: 8.3 mg/dL — ABNORMAL LOW (ref 8.7–10.2)
Chloride: 105 mmol/L (ref 96–106)
Creatinine, Ser: 1.07 mg/dL (ref 0.76–1.27)
Globulin, Total: 2.3 g/dL (ref 1.5–4.5)
Glucose: 89 mg/dL (ref 70–99)
Potassium: 3.6 mmol/L (ref 3.5–5.2)
Sodium: 141 mmol/L (ref 134–144)
Total Protein: 6.2 g/dL (ref 6.0–8.5)
eGFR: 86 mL/min/{1.73_m2} (ref >59)

## 2023-10-18 LAB — LIPID PANEL
Chol/HDL Ratio: 2 {ratio} (ref 0.0–5.0)
Cholesterol, Total: 186 mg/dL (ref 100–199)
HDL: 92 mg/dL (ref >39)
LDL Chol Calc (NIH): 76 mg/dL (ref 0–99)
Triglycerides: 102 mg/dL (ref 0–149)
VLDL Cholesterol Cal: 18 mg/dL (ref 5–40)

## 2023-10-18 LAB — TSH RFX ON ABNORMAL TO FREE T4: TSH: 1.13 u[IU]/mL (ref 0.450–4.500)

## 2023-10-27 ENCOUNTER — Encounter (HOSPITAL_BASED_OUTPATIENT_CLINIC_OR_DEPARTMENT_OTHER): Payer: Self-pay | Admitting: Family Medicine

## 2023-10-27 DIAGNOSIS — D539 Nutritional anemia, unspecified: Secondary | ICD-10-CM

## 2023-11-09 NOTE — Telephone Encounter (Signed)
Called pt. Advised of lab results with verbal understanding. He will follow up with gastrologist. Also will wait for an appointment with hematology

## 2023-12-18 ENCOUNTER — Inpatient Hospital Stay: Payer: 59 | Admitting: Oncology

## 2023-12-22 ENCOUNTER — Ambulatory Visit (INDEPENDENT_AMBULATORY_CARE_PROVIDER_SITE_OTHER): Payer: 59 | Admitting: Internal Medicine

## 2023-12-22 ENCOUNTER — Encounter: Payer: Self-pay | Admitting: Internal Medicine

## 2023-12-22 ENCOUNTER — Other Ambulatory Visit (INDEPENDENT_AMBULATORY_CARE_PROVIDER_SITE_OTHER): Payer: 59

## 2023-12-22 VITALS — BP 130/78 | HR 86 | Ht 71.0 in | Wt 159.0 lb

## 2023-12-22 DIAGNOSIS — K219 Gastro-esophageal reflux disease without esophagitis: Secondary | ICD-10-CM

## 2023-12-22 DIAGNOSIS — R1310 Dysphagia, unspecified: Secondary | ICD-10-CM

## 2023-12-22 DIAGNOSIS — K760 Fatty (change of) liver, not elsewhere classified: Secondary | ICD-10-CM | POA: Diagnosis not present

## 2023-12-22 DIAGNOSIS — R7989 Other specified abnormal findings of blood chemistry: Secondary | ICD-10-CM

## 2023-12-22 DIAGNOSIS — R5383 Other fatigue: Secondary | ICD-10-CM

## 2023-12-22 DIAGNOSIS — D649 Anemia, unspecified: Secondary | ICD-10-CM

## 2023-12-22 LAB — CBC WITH DIFFERENTIAL/PLATELET
Basophils Absolute: 0.1 10*3/uL (ref 0.0–0.1)
Basophils Relative: 1.2 % (ref 0.0–3.0)
Eosinophils Absolute: 0.1 10*3/uL (ref 0.0–0.7)
Eosinophils Relative: 1.9 % (ref 0.0–5.0)
HCT: 35.7 % — ABNORMAL LOW (ref 39.0–52.0)
Hemoglobin: 12 g/dL — ABNORMAL LOW (ref 13.0–17.0)
Lymphocytes Relative: 24.6 % (ref 12.0–46.0)
Lymphs Abs: 1.4 10*3/uL (ref 0.7–4.0)
MCHC: 33.6 g/dL (ref 30.0–36.0)
MCV: 105.5 fL — ABNORMAL HIGH (ref 78.0–100.0)
Monocytes Absolute: 0.8 10*3/uL (ref 0.1–1.0)
Monocytes Relative: 14.1 % — ABNORMAL HIGH (ref 3.0–12.0)
Neutro Abs: 3.2 10*3/uL (ref 1.4–7.7)
Neutrophils Relative %: 58.2 % (ref 43.0–77.0)
Platelets: 204 10*3/uL (ref 150.0–400.0)
RBC: 3.39 Mil/uL — ABNORMAL LOW (ref 4.22–5.81)
RDW: 17.2 % — ABNORMAL HIGH (ref 11.5–15.5)
WBC: 5.5 10*3/uL (ref 4.0–10.5)

## 2023-12-22 LAB — HEPATIC FUNCTION PANEL
ALT: 41 U/L (ref 0–53)
AST: 72 U/L — ABNORMAL HIGH (ref 0–37)
Albumin: 4.1 g/dL (ref 3.5–5.2)
Alkaline Phosphatase: 99 U/L (ref 39–117)
Bilirubin, Direct: 0.3 mg/dL (ref 0.0–0.3)
Total Bilirubin: 1.2 mg/dL (ref 0.2–1.2)
Total Protein: 7.1 g/dL (ref 6.0–8.3)

## 2023-12-22 LAB — VITAMIN D 25 HYDROXY (VIT D DEFICIENCY, FRACTURES): VITD: 14.56 ng/mL — ABNORMAL LOW (ref 30.00–100.00)

## 2023-12-22 LAB — IBC + FERRITIN
Ferritin: 78.7 ng/mL (ref 22.0–322.0)
Iron: 205 ug/dL — ABNORMAL HIGH (ref 42–165)
Saturation Ratios: 52.9 % — ABNORMAL HIGH (ref 20.0–50.0)
TIBC: 387.8 ug/dL (ref 250.0–450.0)
Transferrin: 277 mg/dL (ref 212.0–360.0)

## 2023-12-22 LAB — PROTIME-INR
INR: 1.1 {ratio} — ABNORMAL HIGH (ref 0.8–1.0)
Prothrombin Time: 11.4 s (ref 9.6–13.1)

## 2023-12-22 NOTE — Progress Notes (Unsigned)
Chief Complaint: Elevated LFTs  HPI: 48 year old male with past medical history significant for hepatic steatosis with elevated LFTs, paroxysmal tachycardia, macrocytic anemia, presents for follow up of elevated LFTs  Labs 12/2021: Normal iron, ferritin, ANA, alpha-1 antitrypsin, ceruloplasmin, mitochondrial antibody, ASMA, and IgG.  Liver biopsy 02/2022 showed minimally active steatohepatitis and stage 2/3 liver fibrosis..  Thought to be due to possible alcoholic liver disease, nonalcoholic fatty liver disease, or possible exposure to isopropyl alcohol via inhalation per his work (less likely).  RUQ U/S 05/27/23: 1. Diffusely increased hepatic echogenicity without focal lesion. Imaging features suggest hepatic steatosis. 2. No evidence for cholelithiasis or biliary ductal dilatation.  CT abdomen pelvis with contrast 05/27/23 shows decreased hepatic density typically with hepatic steatosis, unremarkable gallbladder, marked atrophic left kidney (nonfunctional), submucosal fatty infiltration of cecum, ascending and proximal transverse colon can be seen with chronic inflammation.  Interval History: He is not working with isoprophyl alcohol. Denies any alcohol. Last alcohol use was 1.5 month ago. He only had one drink at that time. Denies blood in the stools. He takes ibuprofen most of the days of the week. ***   He stopped working with isopropyl alcohol a few months ago. 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 Denies alcohol use. Last drink of alcohol was 1.5 months but it is much less than he was using. He will feel a tightness in his chest at times. After he eats two bites, he will feel full.  He does feel like the omeprazole medication has significantly helped with his chest tightness and dysphagia. He is eating two meals per day and is working up to eating 3 meals per day. He had severe ab pain and bloating back in 05/2023 for which he went to the ED. His CT showed colitis at  that time, which is most likely due to infection since his last colonoscopy in 2023 was fairly unremarkable. His ab pain, diarrhea, and bloating have improved. Last week he had 3 days of diarrhea, which was attributed to a GI infection. He has been wearing compression stockings to help with some swelling in his legs. Denies confusion or blood in the stools.  He saw vascular surgery who attributed his BLE swelling to chronic venous insufficiency.  Wt Readings from Last 3 Encounters:  12/22/23 159 lb (72.1 kg)  10/02/23 159 lb 12.8 oz (72.5 kg)  09/13/23 163 lb 8 oz (74.2 kg)    PREVIOUS GI WORKUP  RUQ ultrasound 06/24/2021: Mild increased echogenicity in the liver without focal mass (hepatic steatosis)  EGD 08/2022 done for follow-up of gastric ulcer: Mucosa suspicious for short segment Barrett's (squamocolumnar junctional mucosa with mild features suggestive of reflux, negative for intestinal metaplasia), erythematous mucosa in antrum, normal duodenum.  EGD 02/2022 for dysphagia: Nonbleeding gastric ulcers with pigmented material (reactive gastropathy with minimal chronic gastritis negative for H. pylori), many nonbleeding erosions in the middle third of esophagus and lower third of the esophagus, normal duodenum (reactive duodenal mucosa negative for intraepithelial lymphocytosis)  Colonoscopy 02/2022 for screening: Multiple diverticula in ascending, 2 tubular adenomas and sigmoid (3 to 5 mm in size), nonbleeding internal hemorrhoids   Past Medical History:  Diagnosis Date   Allergy    Scoliosis    - s/p correctvie Sgx   Tachycardia    Venous insufficiency of both lower extremities 05/2021   Lower extremity venous Doppler 05/29/2021: No evidence of DVT.  Venous reflux noted in the right GSV in the calf.  Venous reflux noted  in the left common femoral vein and left greater saphenous vein in the thigh.  Left GSV in the calf as well as a left perforator vein. => Forwarded to vascular surgery:     Past Surgical History:  Procedure Laterality Date   BACK SURGERY  03/1990   Scoliosis   COLONOSCOPY     ENDOVENOUS ABLATION SAPHENOUS VEIN W/ LASER Left 04/20/2022   endovenous laser ablation left greater saphenous vein and stab phlebectomy 10-20 incisions left leg by Cari Caraway MD   TRANSTHORACIC ECHOCARDIOGRAM  06/08/2019   EF 60-65%.  Normal wall motion.  Normal relaxation.  Mild aortic valve thickening but no sclerosis/stenosis.  Normal echo   UPPER GASTROINTESTINAL ENDOSCOPY  02/21/2022   Leonides Schanz    Current Outpatient Medications  Medication Sig Dispense Refill   fluticasone (FLONASE) 50 MCG/ACT nasal spray Place 2 sprays into both nostrils daily. 16 g 6   furosemide (LASIX) 20 MG tablet Take 20 mg daily as needed for weight gain of 3 lb overnight or 5 lb in 1 week. 90 tablet 3   ibuprofen (ADVIL) 100 MG tablet Take 100 mg by mouth every 6 (six) hours as needed for fever.     metoprolol succinate (TOPROL XL) 25 MG 24 hr tablet Take 1.5 tablets (37.5 mg total) by mouth at bedtime. 135 tablet 3   Multiple Vitamin (MULTIVITAMIN) tablet Take 1 tablet by mouth daily.     famotidine (PEPCID) 20 MG tablet Take 1 tablet (20 mg total) by mouth 2 (two) times daily. 60 tablet 1   No current facility-administered medications for this visit.    Allergies as of 12/22/2023 - Review Complete 12/22/2023  Allergen Reaction Noted   Allegra [fexofenadine] Other (See Comments) 08/11/2022   Darvon [propoxyphene] Itching 05/27/2021   Tessalon [benzonatate] Itching 05/27/2021   Latex Dermatitis and Rash 04/19/2021   Morphine and codeine Rash 04/19/2021    Family History  Problem Relation Age of Onset   Atrial fibrillation Mother    Heart failure Mother    Hypertension Father    Hyperlipidemia Father    Colon cancer Maternal Aunt    Colon cancer Paternal Uncle    CAD Paternal Grandfather        noted to have Angina (not aware of details)   Stomach cancer Neg Hx    Esophageal cancer  Neg Hx    Rectal cancer Neg Hx    Pancreatic cancer Neg Hx     Social History   Socioeconomic History   Marital status: Single    Spouse name: Not on file   Number of children: Not on file   Years of education: Not on file   Highest education level: Not on file  Occupational History   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  He reports the only medication he takes is Benadryl, and that he takes approximately 2 tablets/day.  As a Charity fundraiser, he reports he is exposed to many chemicals, but is wearing  his protective gear, and reports that other chemists work with the most dangerous chemicals and have special training to do so.  He has never had symptoms like this before.     Social Drivers of Corporate investment banker Strain: Not on file  Food Insecurity: Not on file  Transportation Needs: Not on file  Physical Activity: Not on file  Stress: Not on file  Social Connections: Not on file  Intimate Partner Violence: Not on file     Physical Exam:  Vital signs: BP 130/78   Pulse 86   Ht 5\' 11"  (1.803 m)   Wt 159 lb (72.1 kg)   BMI 22.18 kg/m   Constitutional: NAD, Well developed, Well nourished, alert and cooperative Head:  Normocephalic and atraumatic. Respiratory: Respirations even and unlabored. Lungs clear to auscultation bilaterally. Cardiovascular:  Regular rate and rhythm. No peripheral edema, cyanosis or pallor.  Compression socks on Gastrointestinal:  Soft, nondistended, nontender. No rebound or guarding. Normal bowel sounds Neurologic:   Alert and  oriented  Skin:   Dry and intact without significant lesions or rashes. Psychiatric: Oriented to person, place and time. Demonstrates good judgement and reason without abnormal affect or behaviors.   RELEVANT LABS AND IMAGING: CBC    Component Value Date/Time   WBC 4.9 10/17/2023 0956   WBC 4.3 05/31/2023 1031   RBC 3.05 (L) 10/17/2023 0956   RBC 2.62 (L) 05/31/2023 1031   HGB 10.9 (L) 10/17/2023 0956   HCT 32.6 (L) 10/17/2023 0956   PLT 207 10/17/2023 0956   MCV 107 (H) 10/17/2023 0956   MCH 35.7 (H) 10/17/2023 0956   MCH 36.7 (H) 05/27/2023 1822   MCHC 33.4 10/17/2023 0956   MCHC 32.3 05/31/2023 1031   RDW 14.1 10/17/2023 0956   LYMPHSABS 1.6 10/17/2023 0956   MONOABS 0.5 05/31/2023 1031   EOSABS 0.1 10/17/2023 0956   BASOSABS 0.1 10/17/2023 0956    CMP     Component Value Date/Time   NA 141 10/17/2023 0956   K 3.6 10/17/2023 0956   CL 105 10/17/2023 0956   CO2 19 (L) 10/17/2023 0956   GLUCOSE 89 10/17/2023 0956   GLUCOSE 75 05/31/2023 1031   BUN 11 10/17/2023 0956   CREATININE 1.07 10/17/2023 0956   CALCIUM 8.3 (L) 10/17/2023 0956   PROT 6.2 10/17/2023 0956   ALBUMIN 3.9 (L) 10/17/2023 0956   AST 59 (H) 10/17/2023 0956   ALT 46 (H) 10/17/2023 0956   ALKPHOS 81 10/17/2023 0956   BILITOT 0.8 10/17/2023 0956   GFRNONAA >60 05/27/2023 1822    Assessment: Elevated LFTs Hepatic steatosis GERD Dysphagia Patient's LFTs have significantly improved with removal of exposure to isopropyl alcohol and decrease in his alcohol consumption as well.  Patient does still have some dysphagia that could be due to some uncontrolled reflux.  He has already had improvement with taking omeprazole twice daily.  I will add on Pepcid 20 mg twice daily to see if this helps further with his feelings of dysphagia.  Patient does mention today that he is considering a new job that may expose him to diluted chemicals.  I asked the patient to let me know if he is going to start this  new job and for [place] to get some baseline LFTs prior to his new job.  Patient has had F2/F3 fibrosis in the past so we will need to re-evaluate his liver fibrosis status in the future - Check CBC, LFTs, INR, ferritin/IBC, vitamin D - Patient  strongly dislikes coffee - Continue to avoid alcohol if possible - Continue omeprazole 40 mg BID - Continue Pepcid 20 mg BID - Consider Rezdiffra if LFTs remain elevated - switch from NSAID use ot Tylenol up to 2000 mg QD - RTC 3 months  Eulah Pont, MD [facility] 12/22/2023, 9:59 AM  I spent 46 minutes of time, including in depth chart review, independent review of results as outlined above, communicating results with the patient directly, face-to-face time with the patient, coordinating care, ordering studies and medications as appropriate, and documentation.

## 2023-12-22 NOTE — Patient Instructions (Signed)
Your provider has requested that you go to the basement level for lab work before leaving today. Press "B" on the elevator. The lab is located at the first door on the left as you exit the elevator.  Switch from ibuprofen to Tylenol. Only take up to 2000 mg daily.   _______________________________________________________  If your blood pressure at your visit was 140/90 or greater, please contact your primary care physician to follow up on this.  _______________________________________________________  If you are age 49 or older, your body mass index should be between 23-30. Your Body mass index is 22.18 kg/m. If this is out of the aforementioned range listed, please consider follow up with your Primary Care Provider.  If you are age 90 or younger, your body mass index should be between 19-25. Your Body mass index is 22.18 kg/m. If this is out of the aformentioned range listed, please consider follow up with your Primary Care Provider.   ________________________________________________________  The [facility] providers would like to encourage you to use [facility] to communicate with providers for non-urgent requests or questions.  Due to long hold times on the telephone, sending your provider a message by [facility] may be a faster and more efficient way to get a response.  Please allow 48 business hours for a response.  Please remember that this is for non-urgent requests.  _______________________________________________________

## 2023-12-25 ENCOUNTER — Inpatient Hospital Stay: Payer: 59 | Attending: Oncology | Admitting: Oncology

## 2023-12-25 DIAGNOSIS — F17290 Nicotine dependence, other tobacco product, uncomplicated: Secondary | ICD-10-CM | POA: Insufficient documentation

## 2023-12-25 DIAGNOSIS — I479 Paroxysmal tachycardia, unspecified: Secondary | ICD-10-CM | POA: Insufficient documentation

## 2023-12-25 DIAGNOSIS — D539 Nutritional anemia, unspecified: Secondary | ICD-10-CM | POA: Insufficient documentation

## 2023-12-25 DIAGNOSIS — Z79899 Other long term (current) drug therapy: Secondary | ICD-10-CM | POA: Insufficient documentation

## 2023-12-25 NOTE — Progress Notes (Deleted)
 [facility]  HEMATOLOGY CLINIC CONSULTATION NOTE   PATIENT NAME: [facility]   MR#: [id number] DOB: [birth year]  DATE OF SERVICE: 12/25/2023  Patient Care Team: [place], Buren Kos, MD as PCP - General (Family Medicine) Marykay Lex, MD as PCP - Cardiology (Cardiology)  REASON FOR CONSULTATION/ CHIEF COMPLAINT:  Evaluation of macrocytic anemia.  ASSESSMENT & PLAN:   Jacob Davila is a 48 y.o. gentleman with a past medical history of ***, was referred to our service for evaluation of macrocytic anemia.    No problem-specific Assessment & Plan notes found for this encounter.   I reviewed lab results and outside records for this visit and discussed relevant results with the patient. Diagnosis, plan of care and treatment options were also discussed in detail with the patient. Opportunity provided to ask questions and answers provided to his apparent satisfaction. Provided instructions to call our clinic with any problems, questions or concerns prior to return visit. I recommended to continue follow-up with PCP and sub-specialists. He verbalized understanding and agreed with the plan. No barriers to learning was detected.  [name], MD [facility] - A DEPT OF [facility] [address] [place] [address] Dept: [phone] Dept Fax: [phone]  12/25/2023 9:15 AM  HISTORY OF PRESENT ILLNESS:  Discussed the use of AI scribe software for clinical note transcription with the patient, who gave verbal consent to proceed.   10/17/2023, labs at his PCPs office showed hemoglobin of 10.9, hematocrit 32.6, MCV 107.  White count 4900 with normal differential.  Platelet count normal at 207,000.  This prompted referral to hematology service for further evaluation of macrocytic anemia.  He had repeat labs on 12/22/2023.  Hemoglobin was 12, hematocrit 35.7, MCV 105.5.  White count and platelet  count remained normal.  Iron studies showed iron saturation of 53%, slightly elevated, otherwise unremarkable.  Previously his AST and ALT were elevated.  Repeat hepatic function panel on 12/22/2023 showed AST of 72 units/L, otherwise unremarkable LFTs.  ***He denies recent chest pain on exertion, shortness of breath on minimal exertion, pre-syncopal episodes, or palpitations.  ***He had not noticed any recent bleeding such as epistaxis, hematuria or hematochezia  ***The patient denies over the counter NSAID ingestion. He is not *** on antiplatelets agents.  His last colonoscopy was ***  ***He had no prior history or diagnosis of cancer. His age appropriate screening programs are up-to-date.  ***He denies any pica and eats a variety of diet.  ***He never donated blood or received blood transfusion  ***The patient was prescribed oral iron supplements and he takes ***  MEDICAL HISTORY:  Past Medical History:  Diagnosis Date   Allergy    Scoliosis    - s/p correctvie Sgx   Tachycardia    Venous insufficiency of both lower extremities 05/2021   Lower extremity venous Doppler 05/29/2021: No evidence of DVT.  Venous reflux noted in the right GSV in the calf.  Venous reflux noted in the left common femoral vein and left greater saphenous vein in the thigh.  Left GSV in the calf as well as a left perforator vein. => Forwarded to vascular surgery:    SURGICAL HISTORY: Past Surgical History:  Procedure Laterality Date   BACK SURGERY  03/1990   Scoliosis   COLONOSCOPY     ENDOVENOUS ABLATION SAPHENOUS VEIN W/ LASER Left 04/20/2022   endovenous laser ablation left greater saphenous vein and  stab phlebectomy 10-20 incisions left leg by Cari Caraway MD   TRANSTHORACIC ECHOCARDIOGRAM  06/08/2019   EF 60-65%.  Normal wall motion.  Normal relaxation.  Mild aortic valve thickening but no sclerosis/stenosis.  Normal echo   UPPER GASTROINTESTINAL ENDOSCOPY  02/21/2022   [place]    SOCIAL  HISTORY: He reports that he has been smoking cigars. He has never used smokeless tobacco. He reports current alcohol use. He reports that he does not use drugs. 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  He reports the only medication he takes is Benadryl, and that he takes approximately 2 tablets/day.  As a Charity fundraiser, he reports he is exposed to many chemicals, but is wearing his protective gear, and reports that other chemists work with the most dangerous chemicals and have special training to do so.  He has never had symptoms like this before.     Social Drivers of Corporate investment banker Strain: Not on file  Food Insecurity: Not on file  Transportation Needs: Not on file  Physical Activity: Not on file  Stress: Not on file  Social Connections: Not on file  Intimate Partner Violence: Not on file    FAMILY HISTORY: Family History  Problem Relation Age of Onset   Atrial fibrillation Mother    Heart failure Mother    Hypertension Father    Hyperlipidemia Father    Colon cancer Maternal Aunt    Colon cancer Paternal Uncle    CAD Paternal Grandfather        noted to have Angina (not aware of details)   Stomach cancer Neg Hx    Esophageal cancer Neg Hx    Rectal cancer Neg Hx    Pancreatic cancer Neg Hx     ALLERGIES:  He is  allergic to allegra [fexofenadine], darvon [propoxyphene], tessalon [benzonatate], latex, and morphine and codeine.  MEDICATIONS:  Current Outpatient Medications  Medication Sig Dispense Refill   famotidine (PEPCID) 20 MG tablet Take 1 tablet (20 mg total) by mouth 2 (two) times daily. 60 tablet 1   fluticasone (FLONASE) 50 MCG/ACT nasal spray Place 2 sprays into both nostrils daily. 16 g 6   furosemide (LASIX) 20 MG tablet Take 20 mg daily as needed for weight gain of 3 lb overnight or 5 lb in 1 week. 90 tablet 3   ibuprofen (ADVIL) 100 MG tablet Take 100 mg by mouth every 6 (six) hours as needed for fever.     metoprolol succinate (TOPROL XL) 25 MG 24 hr tablet Take 1.5 tablets (37.5 mg total) by mouth at bedtime. 135 tablet 3   Multiple Vitamin (MULTIVITAMIN) tablet Take 1 tablet by mouth daily.  omeprazole (PRILOSEC OTC) 20 MG tablet Take 40 mg by mouth in the morning and at bedtime.     No current facility-administered medications for this visit.    REVIEW OF SYSTEMS:    Review of Systems - Oncology  All other pertinent systems were reviewed and were negative except as mentioned above.  PHYSICAL EXAMINATION:  ***   There were no vitals filed for this visit. There were no vitals filed for this visit.  Physical Exam  ***  LABORATORY DATA:   I have reviewed the data as listed.  No results found for any visits on 12/25/23.  Lab Results  Component Value Date   WBC 5.5 12/22/2023   NEUTROABS 3.2 12/22/2023   HGB 12.0 (L) 12/22/2023   HCT 35.7 (L) 12/22/2023   MCV 105.5 (H) 12/22/2023   PLT 204.0 12/22/2023    Lab Results  Component Value Date   IRON 205 (H) 12/22/2023   TIBC 387.8 12/22/2023   FERRITIN 78.7 12/22/2023    Recent Labs    05/27/23 1822 05/31/23 1031 08/21/23 0848 10/17/23 0956 12/22/23 1024  NA 135 136 144 141  --   K 3.7 4.1 4.3 3.6  --   CL 101 101 106 105  --   CO2 27 27 22  19*  --   GLUCOSE 134* 75 78 89  --   BUN 6 13 12 11   --    CREATININE 0.96 1.31 1.23 1.07  --   CALCIUM 8.3* 9.3 9.0 8.3*  --   GFRNONAA >60  --   --   --   --   PROT 6.3* 6.6 6.7 6.2 7.1  ALBUMIN 3.4* 3.5 4.2 3.9* 4.1  AST 190* 110* 92* 59* 72*  ALT 107* 71* 59* 46* 41  ALKPHOS 463* 451* 89 81 99  BILITOT 2.8* 1.9* 0.3 0.8 1.2  BILIDIR  --   --   --   --  0.3     RADIOGRAPHIC STUDIES:  I have personally reviewed the radiological images as listed and agree with the findings in the report.  No results found.  *** No recent pertinent imaging studies available to review.  No orders of the defined types were placed in this encounter.   Future Appointments  Date Time Provider Department Center  12/25/2023  1:00 PM Georgie Eduardo, Archie Patten, MD [facility] None  03/13/2024  1:10 PM [place], Buren Kos, MD [facility]     I spent a total of {CHL ONC TIME VISIT - [id number]} during this encounter with the patient including review of chart and various tests results, discussions about plan of care and coordination of care plan.  This document was completed utilizing speech recognition software. Grammatical errors, random word insertions, pronoun errors, and incomplete sentences are an occasional consequence of this system due to software limitations, ambient noise, and hardware issues. Any formal questions or concerns about the content, text or information contained within the body of this dictation should be directly addressed to the provider for clarification.

## 2024-01-08 ENCOUNTER — Inpatient Hospital Stay: Payer: 59

## 2024-01-08 ENCOUNTER — Encounter: Payer: Self-pay | Admitting: Oncology

## 2024-01-08 ENCOUNTER — Inpatient Hospital Stay (HOSPITAL_BASED_OUTPATIENT_CLINIC_OR_DEPARTMENT_OTHER): Payer: 59 | Admitting: Oncology

## 2024-01-08 VITALS — BP 112/64 | HR 85 | Temp 98.2°F | Resp 18 | Ht 71.0 in | Wt 160.4 lb

## 2024-01-08 DIAGNOSIS — R233 Spontaneous ecchymoses: Secondary | ICD-10-CM | POA: Diagnosis not present

## 2024-01-08 DIAGNOSIS — D539 Nutritional anemia, unspecified: Secondary | ICD-10-CM

## 2024-01-08 DIAGNOSIS — F17290 Nicotine dependence, other tobacco product, uncomplicated: Secondary | ICD-10-CM | POA: Diagnosis not present

## 2024-01-08 DIAGNOSIS — I479 Paroxysmal tachycardia, unspecified: Secondary | ICD-10-CM

## 2024-01-08 DIAGNOSIS — Z79899 Other long term (current) drug therapy: Secondary | ICD-10-CM | POA: Diagnosis not present

## 2024-01-08 LAB — RETICULOCYTES
Immature Retic Fract: 11.8 % (ref 2.3–15.9)
RBC.: 3.35 MIL/uL — ABNORMAL LOW (ref 4.22–5.81)
Retic Count, Absolute: 66 10*3/uL (ref 19.0–186.0)
Retic Ct Pct: 2 % (ref 0.4–3.1)

## 2024-01-08 LAB — LACTATE DEHYDROGENASE: LDH: 197 U/L — ABNORMAL HIGH (ref 98–192)

## 2024-01-08 LAB — IRON AND TIBC
Iron: 30 ug/dL — ABNORMAL LOW (ref 45–182)
Saturation Ratios: 8 % — ABNORMAL LOW (ref 17.9–39.5)
TIBC: 367 ug/dL (ref 250–450)
UIBC: 337 ug/dL

## 2024-01-08 LAB — CBC WITH DIFFERENTIAL (CANCER CENTER ONLY)
Abs Immature Granulocytes: 0 10*3/uL (ref 0.00–0.07)
Basophils Absolute: 0.1 10*3/uL (ref 0.0–0.1)
Basophils Relative: 2 %
Eosinophils Absolute: 0.1 10*3/uL (ref 0.0–0.5)
Eosinophils Relative: 2 %
HCT: 34.8 % — ABNORMAL LOW (ref 39.0–52.0)
Hemoglobin: 11.9 g/dL — ABNORMAL LOW (ref 13.0–17.0)
Immature Granulocytes: 0 %
Lymphocytes Relative: 39 %
Lymphs Abs: 1.3 10*3/uL (ref 0.7–4.0)
MCH: 35.4 pg — ABNORMAL HIGH (ref 26.0–34.0)
MCHC: 34.2 g/dL (ref 30.0–36.0)
MCV: 103.6 fL — ABNORMAL HIGH (ref 80.0–100.0)
Monocytes Absolute: 0.5 10*3/uL (ref 0.1–1.0)
Monocytes Relative: 14 %
Neutro Abs: 1.5 10*3/uL — ABNORMAL LOW (ref 1.7–7.7)
Neutrophils Relative %: 43 %
Platelet Count: 247 10*3/uL (ref 150–400)
RBC: 3.36 MIL/uL — ABNORMAL LOW (ref 4.22–5.81)
RDW: 16 % — ABNORMAL HIGH (ref 11.5–15.5)
WBC Count: 3.5 10*3/uL — ABNORMAL LOW (ref 4.0–10.5)
nRBC: 0 % (ref 0.0–0.2)

## 2024-01-08 LAB — CMP (CANCER CENTER ONLY)
ALT: 40 U/L (ref 0–44)
AST: 58 U/L — ABNORMAL HIGH (ref 15–41)
Albumin: 3.9 g/dL (ref 3.5–5.0)
Alkaline Phosphatase: 83 U/L (ref 38–126)
Anion gap: 8 (ref 5–15)
BUN: 16 mg/dL (ref 6–20)
CO2: 26 mmol/L (ref 22–32)
Calcium: 8.7 mg/dL — ABNORMAL LOW (ref 8.9–10.3)
Chloride: 103 mmol/L (ref 98–111)
Creatinine: 1.17 mg/dL (ref 0.61–1.24)
GFR, Estimated: >60 mL/min (ref >60)
Glucose, Bld: 103 mg/dL — ABNORMAL HIGH (ref 70–99)
Potassium: 3.7 mmol/L (ref 3.5–5.1)
Sodium: 137 mmol/L (ref 135–145)
Total Bilirubin: 0.3 mg/dL (ref 0.0–1.2)
Total Protein: 7 g/dL (ref 6.5–8.1)

## 2024-01-08 LAB — FIBRINOGEN: Fibrinogen: 320 mg/dL (ref 210–475)

## 2024-01-08 LAB — APTT: aPTT: 28 s (ref 24–36)

## 2024-01-08 LAB — DIRECT ANTIGLOBULIN TEST (NOT AT ARMC)
DAT, IgG: NEGATIVE
DAT, complement: NEGATIVE

## 2024-01-08 LAB — FOLATE: Folate: 4.4 ng/mL — ABNORMAL LOW (ref >5.9)

## 2024-01-08 LAB — PROTIME-INR
INR: 1 (ref 0.8–1.2)
Prothrombin Time: 13.6 s (ref 11.4–15.2)

## 2024-01-08 LAB — FERRITIN: Ferritin: 67 ng/mL (ref 24–336)

## 2024-01-08 LAB — VITAMIN B12: Vitamin B-12: 155 pg/mL — ABNORMAL LOW (ref 180–914)

## 2024-01-08 NOTE — Progress Notes (Signed)
[facility]  HEMATOLOGY CLINIC CONSULTATION NOTE   PATIENT NAME: Jacob Davila   MR#: [id number] DOB: 18-Sep-1976  DATE OF SERVICE: 01/08/2024  Patient Care Team: de [place], Buren Kos, MD as PCP - General (Family Medicine) Marykay Lex, MD as PCP - Cardiology (Cardiology)  REASON FOR CONSULTATION/ CHIEF COMPLAINT:  Evaluation of macrocytic anemia.  ASSESSMENT & PLAN:   Jacob Davila is a 48 y.o. gentleman with a past medical history of tachycardia, GERD, was referred to our service for evaluation of macrocytic anemia.    Macrocytic anemia Chronic anemia with macrocytosis.  Hemoglobin improved from 10.9 in November to 12. MCV was elevated at 110, now 103.6.  Possible causes include vitamin B12 or folic acid deficiency, or malabsorption. Occupational chemical exposure considered.  Will check B12, folate, methylmalonic acid today.  Will follow-up on results.  LDH, reticulocyte count, CMP are unremarkable.  - Schedule follow-up call next Wednesday at 12 PM to discuss results  - Plan follow-up visit in three months to repeat blood work  Paroxysmal tachycardia (HCC) Elevated resting heart rate managed with metoprolol. Reports resting heart rate around 85, higher than expected. - Continue metoprolol as prescribed  Easy bruisability PT and PTT are within normal limits today.  Will consider additional testing if symptoms were to persist.  Clinical suspicion low for platelet disorders or von Willebrand disease.   I reviewed lab results and outside records for this visit and discussed relevant results with the patient. Diagnosis, plan of care and treatment options were also discussed in detail with the patient. Opportunity provided to ask questions and answers provided to his apparent satisfaction. Provided instructions to call our clinic with any problems, questions or concerns prior to return visit. I recommended to continue follow-up with PCP and sub-specialists.  He verbalized understanding and agreed with the plan. No barriers to learning was detected.  [name], MD [facility] - A DEPT OF [facility] [address] [place] [address] Dept: [phone] Dept Fax: [phone]  01/08/2024 4:02 PM  HISTORY OF PRESENT ILLNESS:  Discussed the use of AI scribe software for clinical note transcription with the patient, who gave verbal consent to proceed.   10/17/2023, labs at his PCPs office showed hemoglobin of 10.9, hematocrit 32.6, MCV 107.  White count 4900 with normal differential.  Platelet count normal at 207,000.  This prompted referral to hematology service for further evaluation of macrocytic anemia.  He had repeat labs on 12/22/2023.  Hemoglobin was 12, hematocrit 35.7, MCV 105.5.  White count and platelet count remained normal.  Iron studies showed iron saturation of 53%, slightly elevated, otherwise unremarkable.  Previously his AST and ALT were elevated.  Repeat hepatic function panel on 12/22/2023 showed AST of 72 units/L, otherwise unremarkable LFTs.  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  He reports fatigue and easy bruising. The patient reports always feeling tired and has a history of tachycardia, for which he takes a beta blocker. The patient also reports trouble sleeping and has a history of scoliosis which causes pain, particularly in cold weather. The patient smokes cigars and consumes alcohol very infrequently.  He denies recent chest pain on exertion, shortness of breath on minimal exertion, pre-syncopal episodes, or palpitations.  He had not noticed any recent bleeding such as epistaxis, hematuria or hematochezia.  MEDICAL HISTORY:  Past Medical History:  Diagnosis Date   Allergy  Scoliosis    - s/p correctvie Sgx   Tachycardia    Venous insufficiency of both lower extremities 05/2021   Lower  extremity venous Doppler 05/29/2021: No evidence of DVT.  Venous reflux noted in the right GSV in the calf.  Venous reflux noted in the left common femoral vein and left greater saphenous vein in the thigh.  Left GSV in the calf as well as a left perforator vein. => Forwarded to vascular surgery:    SURGICAL HISTORY: Past Surgical History:  Procedure Laterality Date   BACK SURGERY  03/1990   Scoliosis   COLONOSCOPY     ENDOVENOUS ABLATION SAPHENOUS VEIN W/ LASER Left 04/20/2022   endovenous laser ablation left greater saphenous vein and stab phlebectomy 10-20 incisions left leg by Cari Caraway MD   TRANSTHORACIC ECHOCARDIOGRAM  06/08/2019   EF 60-65%.  Normal wall motion.  Normal relaxation.  Mild aortic valve thickening but no sclerosis/stenosis.  Normal echo   UPPER GASTROINTESTINAL ENDOSCOPY  02/21/2022   [facility]    SOCIAL HISTORY: He reports that he has been smoking cigars. He has never used smokeless tobacco. He reports current alcohol use. He reports that he does not use drugs. Social History   Socioeconomic History   Marital status: Single    Spouse name: Not on file   Number of children: Not on file   Years of education: Not on file   Highest education level: Not on file  Occupational History   Occupation: Charity fundraiser - works night shift    Comment: Theatre stage manager  Tobacco Use   Smoking status: Some Days    Types: Cigars   Smokeless tobacco: Never  Vaping Use   Vaping status: Never Used  Substance and Sexual Activity   Alcohol use: Yes    Comment: once every few months   Drug use: Never   Sexual activity: Not on file  Other Topics Concern   Not on file  Social History Narrative   As a Charity fundraiser, he reports he is exposed to many chemicals, but is wearing his protective gear, and reports that other chemists work with the most dangerous chemicals and have special training to do so.  He has never had symptoms like this before.     Social Drivers of Manufacturing engineer Strain: Not on file  Food Insecurity: Not on file  Transportation Needs: Not on file  Physical Activity: Not on file  Stress: Not on file  Social Connections: Not on file  Intimate Partner Violence: Not on file    FAMILY HISTORY: Family History  Problem Relation Age of Onset   Atrial fibrillation Mother    Heart failure Mother    Hypertension Father    Hyperlipidemia Father    Colon cancer Maternal Aunt    Colon cancer Paternal Uncle    CAD Paternal Grandfather        noted to have Angina (not aware of details)   Stomach cancer Neg Hx    Esophageal cancer Neg Hx    Rectal cancer Neg Hx    Pancreatic cancer Neg Hx     ALLERGIES:  He is allergic to allegra [fexofenadine], darvon [propoxyphene], tessalon [benzonatate], latex, and morphine and codeine.  MEDICATIONS:  Current Outpatient Medications  Medication Sig Dispense Refill   metoprolol succinate (TOPROL XL) 25 MG 24 hr tablet Take 1.5 tablets (37.5 mg total) by mouth at bedtime. 135 tablet 3   Multiple Vitamin (MULTIVITAMIN) tablet Take 1 tablet by  mouth daily.     omeprazole (PRILOSEC OTC) 20 MG tablet Take 40 mg by mouth in the morning and at bedtime.     fluticasone (FLONASE) 50 MCG/ACT nasal spray Place 2 sprays into both nostrils daily. (Patient not taking: Reported on 01/08/2024) 16 g 6   furosemide (LASIX) 20 MG tablet Take 20 mg daily as needed for weight gain of 3 lb overnight or 5 lb in 1 week. (Patient not taking: Reported on 01/08/2024) 90 tablet 3   No current facility-administered medications for this visit.    REVIEW OF SYSTEMS:    Review of Systems - Oncology  All other pertinent systems were reviewed and were negative except as mentioned above.  PHYSICAL EXAMINATION:   Onc Performance Status - 01/08/24 1324       ECOG Perf Status   ECOG Perf Status Fully active, able to carry on all pre-disease performance without restriction      KPS SCALE   KPS % SCORE Normal, no  compliants, no evidence of disease             Vitals:   01/08/24 1317  BP: 112/64  Pulse: 85  Resp: 18  Temp: 98.2 F (36.8 C)  SpO2: 96%   Filed Weights   01/08/24 1317  Weight: 160 lb 6.4 oz (72.8 kg)    Physical Exam Constitutional:      General: He is not in acute distress.    Appearance: Normal appearance.  HENT:     Head: Normocephalic and atraumatic.  Eyes:     General: No scleral icterus.    Conjunctiva/sclera: Conjunctivae normal.  Cardiovascular:     Rate and Rhythm: Normal rate and regular rhythm.     Heart sounds: Normal heart sounds.  Pulmonary:     Effort: Pulmonary effort is normal.     Breath sounds: Normal breath sounds.  Abdominal:     General: There is no distension.     Palpations: Abdomen is soft. There is no mass.  Musculoskeletal:     Right lower leg: No edema.     Left lower leg: No edema.  Lymphadenopathy:     Cervical: No cervical adenopathy.  Neurological:     General: No focal deficit present.     Mental Status: He is alert and oriented to person, place, and time.  Psychiatric:        Mood and Affect: Mood normal.        Behavior: Behavior normal.        Thought Content: Thought content normal.     LABORATORY DATA:   I have reviewed the data as listed.  Results for orders placed or performed in visit on 01/08/24  APTT  Result Value Ref Range   aPTT 28 24 - 36 seconds  Protime-INR  Result Value Ref Range   Prothrombin Time 13.6 11.4 - 15.2 seconds   INR 1.0 0.8 - 1.2  Reticulocytes  Result Value Ref Range   Retic Ct Pct 2.0 0.4 - 3.1 %   RBC. 3.35 (L) 4.22 - 5.81 MIL/uL   Retic Count, Absolute 66.0 19.0 - 186.0 K/uL   Immature Retic Fract 11.8 2.3 - 15.9 %  Lactate dehydrogenase  Result Value Ref Range   LDH 197 (H) 98 - 192 U/L  CMP (Cancer Center only)  Result Value Ref Range   Sodium 137 135 - 145 mmol/L   Potassium 3.7 3.5 - 5.1 mmol/L   Chloride 103 98 - 111 mmol/L  CO2 26 22 - 32 mmol/L   Glucose, Bld  103 (H) 70 - 99 mg/dL   BUN 16 6 - 20 mg/dL   Creatinine 1.61 0.96 - 1.24 mg/dL   Calcium 8.7 (L) 8.9 - 10.3 mg/dL   Total Protein 7.0 6.5 - 8.1 g/dL   Albumin 3.9 3.5 - 5.0 g/dL   AST 58 (H) 15 - 41 U/L   ALT 40 0 - 44 U/L   Alkaline Phosphatase 83 38 - 126 U/L   Total Bilirubin 0.3 0.0 - 1.2 mg/dL   GFR, Estimated >04 >54 mL/min   Anion gap 8 5 - 15  CBC with Differential (Cancer Center Only)  Result Value Ref Range   WBC Count 3.5 (L) 4.0 - 10.5 K/uL   RBC 3.36 (L) 4.22 - 5.81 MIL/uL   Hemoglobin 11.9 (L) 13.0 - 17.0 g/dL   HCT 09.8 (L) 11.9 - 14.7 %   MCV 103.6 (H) 80.0 - 100.0 fL   MCH 35.4 (H) 26.0 - 34.0 pg   MCHC 34.2 30.0 - 36.0 g/dL   RDW 82.9 (H) 56.2 - 13.0 %   Platelet Count 247 150 - 400 K/uL   nRBC 0.0 0.0 - 0.2 %   Neutrophils Relative % 43 %   Neutro Abs 1.5 (L) 1.7 - 7.7 K/uL   Lymphocytes Relative 39 %   Lymphs Abs 1.3 0.7 - 4.0 K/uL   Monocytes Relative 14 %   Monocytes Absolute 0.5 0.1 - 1.0 K/uL   Eosinophils Relative 2 %   Eosinophils Absolute 0.1 0.0 - 0.5 K/uL   Basophils Relative 2 %   Basophils Absolute 0.1 0.0 - 0.1 K/uL   Immature Granulocytes 0 %   Abs Immature Granulocytes 0.00 0.00 - 0.07 K/uL     RADIOGRAPHIC STUDIES:  No recent pertinent imaging studies available to review.  Orders Placed This Encounter  Procedures   CBC with Differential (Cancer Center Only)    Standing Status:   Future    Number of Occurrences:   1    Expiration Date:   01/07/2025   CMP (Cancer Center only)    Standing Status:   Future    Number of Occurrences:   1    Expiration Date:   01/07/2025   Iron and TIBC    Standing Status:   Future    Number of Occurrences:   1    Expiration Date:   01/07/2025   Ferritin    Standing Status:   Future    Number of Occurrences:   1    Expiration Date:   01/07/2025   Vitamin B12    Standing Status:   Future    Number of Occurrences:   1    Expiration Date:   01/07/2025   Folate    Standing Status:   Future     Number of Occurrences:   1    Expiration Date:   01/07/2025   Methylmalonic acid, serum    Standing Status:   Future    Number of Occurrences:   1    Expiration Date:   01/07/2025   Lactate dehydrogenase    Standing Status:   Future    Number of Occurrences:   1    Expiration Date:   01/07/2025   Reticulocytes    Standing Status:   Future    Number of Occurrences:   1    Expiration Date:   01/07/2025   Haptoglobin    Standing Status:  Future    Number of Occurrences:   1    Expiration Date:   01/07/2025   Anti-parietal antibody    Standing Status:   Future    Number of Occurrences:   1    Expiration Date:   01/07/2025   Intrinsic Factor Antibodies    Standing Status:   Future    Number of Occurrences:   1    Expiration Date:   01/07/2025   Protime-INR    Standing Status:   Future    Number of Occurrences:   1    Expiration Date:   01/07/2025   APTT    Standing Status:   Future    Number of Occurrences:   1    Expiration Date:   01/07/2025   Fibrinogen    Standing Status:   Future    Number of Occurrences:   1    Expiration Date:   01/07/2025   Direct antiglobulin test (not at [facility])    Standing Status:   Future    Number of Occurrences:   1    Expiration Date:   01/07/2025    Future Appointments  Date Time Provider Department Center  01/17/2024 12:00 PM Niang Mitcheltree, Archie Patten, MD [facility] None  03/13/2024  1:10 PM de [place], Raymond J, MD [facility]  04/02/2024  2:15 PM [facility] PHLEBOTOMIST [facility] None  04/02/2024  2:30 PM Mitchelle Sultan, Archie Patten, MD [facility] None     I spent a total of 55 minutes during this encounter with the patient including review of chart and various tests results, discussions about plan of care and coordination of care plan.  This document was completed utilizing speech recognition software. Grammatical errors, random word insertions, pronoun errors, and incomplete sentences are an occasional consequence of this system due to software limitations, ambient noise,  and hardware issues. Any formal questions or concerns about the content, text or information contained within the body of this dictation should be directly addressed to the provider for clarification.

## 2024-01-08 NOTE — Assessment & Plan Note (Signed)
PT and PTT are within normal limits today.  Will consider additional testing if symptoms were to persist.  Clinical suspicion low for platelet disorders or von Willebrand disease.

## 2024-01-08 NOTE — Assessment & Plan Note (Signed)
Elevated resting heart rate managed with metoprolol. Reports resting heart rate around 85, higher than expected. - Continue metoprolol as prescribed

## 2024-01-08 NOTE — Assessment & Plan Note (Addendum)
Chronic anemia with macrocytosis.  Hemoglobin improved from 10.9 in November to 12. MCV was elevated at 110, now 103.6.  Possible causes include vitamin B12 or folic acid deficiency, or malabsorption. Occupational chemical exposure considered.  Will check B12, folate, methylmalonic acid today.  Will follow-up on results.  LDH, reticulocyte count, CMP are unremarkable.  - Schedule follow-up call next Wednesday at 12 PM to discuss results  - Plan follow-up visit in three months to repeat blood work

## 2024-01-09 DIAGNOSIS — M7989 Other specified soft tissue disorders: Secondary | ICD-10-CM

## 2024-01-09 LAB — HAPTOGLOBIN: Haptoglobin: 156 mg/dL (ref 23–355)

## 2024-01-09 LAB — INTRINSIC FACTOR ANTIBODIES: Intrinsic Factor: 1.1 [AU]/ml (ref 0.0–1.1)

## 2024-01-11 LAB — ANTI-PARIETAL ANTIBODY: Parietal Cell Antibody-IgG: 8.5 U (ref 0.0–20.0)

## 2024-01-12 LAB — METHYLMALONIC ACID, SERUM: Methylmalonic Acid, Quantitative: 246 nmol/L (ref 0–378)

## 2024-01-17 ENCOUNTER — Inpatient Hospital Stay (HOSPITAL_BASED_OUTPATIENT_CLINIC_OR_DEPARTMENT_OTHER): Payer: 59 | Admitting: Oncology

## 2024-01-17 DIAGNOSIS — D509 Iron deficiency anemia, unspecified: Secondary | ICD-10-CM

## 2024-01-17 DIAGNOSIS — R233 Spontaneous ecchymoses: Secondary | ICD-10-CM | POA: Diagnosis not present

## 2024-01-17 DIAGNOSIS — D539 Nutritional anemia, unspecified: Secondary | ICD-10-CM | POA: Diagnosis not present

## 2024-01-17 DIAGNOSIS — D519 Vitamin B12 deficiency anemia, unspecified: Secondary | ICD-10-CM | POA: Diagnosis not present

## 2024-01-17 DIAGNOSIS — E538 Deficiency of other specified B group vitamins: Secondary | ICD-10-CM

## 2024-01-17 MED ORDER — VITAMIN B-12 1000 MCG PO TABS
1000.0000 ug | ORAL_TABLET | Freq: Every day | ORAL | 1 refills | Status: DC
Start: 2024-01-17 — End: 2024-04-02

## 2024-01-17 MED ORDER — FERROUS SULFATE 325 (65 FE) MG PO TBEC
325.0000 mg | DELAYED_RELEASE_TABLET | Freq: Every day | ORAL | 3 refills | Status: DC
Start: 2024-01-17 — End: 2024-04-02

## 2024-01-17 MED ORDER — FOLIC ACID 1 MG PO TABS: 1.0000 mg | ORAL_TABLET | Freq: Every day | ORAL | 1 refills | Status: DC

## 2024-01-17 NOTE — Progress Notes (Signed)
 [facility]  HEMATOLOGY-ONCOLOGY ELECTRONIC VISIT PROGRESS NOTE  PATIENT NAME: [facility] [place]   MR#: [id number] DOB: 1976/04/26  DATE OF SERVICE: 01/17/2024  Patient Care Team: de [place], Buren Kos, MD as PCP - General (Family Medicine) Marykay Lex, MD as PCP - Cardiology (Cardiology)  I connected with the patient via telephone conference and verified that I am speaking with the correct person using two identifiers. The patient's location is at home and I am providing care from the [facility].  I discussed the limitations, risks, security and privacy concerns of performing an evaluation and management service by e-visits and the availability of in person appointments.  I also discussed with the patient that there may be a patient responsible charge related to this service. The patient expressed understanding and agreed to proceed.   ASSESSMENT & PLAN:   Jacob [place] is a 48 y.o. gentleman with a past medical history of tachycardia, GERD, was referred to our service in February 2025 for evaluation of macrocytic anemia.    Macrocytic anemia Chronic anemia with macrocytosis.  Hemoglobin improved from 10.9 in November to 12. MCV was elevated at 110, now 103.6.  Possible causes include vitamin B12 or folic acid deficiency, or malabsorption. Occupational chemical exposure considered.  On his initial consult with [place] on 01/08/2024, labs showed hemoglobin of 11.9, MCV 103.6.  White count 3500 with normal differential.  Platelet count normal at 247,000.  CMP was grossly unremarkable except for mildly elevated AST of 58.  Iron studies showed evidence of iron deficiency with iron decreased at 30, iron saturation decreased at 8%.  Ferritin was 67.  Folic acid level was decreased at 4.4 ng/mL.  Vitamin B12 level was also decreased at 155 pg/mL.  Methylmalonic acid level was within normal limits.  LDH, haptoglobin, INR, PTT, fibrinogen, reticulocyte count were all within normal  limits.  Coombs test negative. Antiparietal cell antibodies and intrinsic factor antibodies were negative, ruling out pernicious anemia.   He was started on vitamin B12, folic acid and iron supplements orally.  - Plan follow-up visit in three months to repeat blood work  Easy bruisability PT and PTT, fibrinogen were all within normal limits on 01/08/2024.  Will consider additional testing if symptoms were to persist.  Clinical suspicion low for platelet disorders or von Willebrand disease.   I discussed the assessment and treatment plan with the patient. The patient was provided an opportunity to ask questions and all were answered. The patient agreed with the plan and demonstrated an understanding of the instructions. The patient was advised to call back or seek an in-person evaluation if the symptoms worsen or if the condition fails to improve as anticipated.    I spent 15 minutes over the phone with the patient reviewing test results, discuss management and coordination/planning of care.  Jacob Crutch, MD 01/17/2024 12:01 PM [facility] - A DEPT [facility] [address] [place] [address] Dept: [phone] Dept Fax: [phone]   INTERVAL HISTORY:  Please see above for problem oriented charting.  The purpose of today's discussion is to explain recent lab results and to formulate plan of care.  He mentions occasional scratches on his skin upon waking, possibly due to restlessness during sleep.  He denies any major blood loss or changes in stool color. He reports a history of moderate alcohol consumption but has been abstinent for an extended period due to  work and family responsibilities. He has undergone colonoscopy and endoscopy in the past, with the most recent procedures occurring approximately two years ago. He recalls a negative reaction to anesthesia during one of these procedures, which resulted in a  hospital visit.  He also reports poor sleep quality, which he has been attempting to manage with melatonin supplements for the past week. He expresses frustration with his sleep issues and is unsure if they are related to a nutrient deficiency. He also mentions a past experience with iron supplements, which he had to take for a month prior to a major surgery when he was 48. He expresses concern about potential constipation from taking iron supplements again.  SUMMARY OF HEMATOLOGY HISTORY:  On 10/17/2023, labs at his PCPs office showed hemoglobin of 10.9, hematocrit 32.6, MCV 107.  White count 4900 with normal differential.  Platelet count normal at 207,000.  This prompted referral to hematology service for further evaluation of macrocytic anemia.   He had repeat labs on 12/22/2023.  Hemoglobin was 12, hematocrit 35.7, MCV 105.5.  White count and platelet count remained normal.  Iron studies showed iron saturation of 53%, slightly elevated, otherwise unremarkable.  Previously his AST and ALT were elevated.  Repeat hepatic function panel on 12/22/2023 showed AST of 72 units/L, otherwise unremarkable LFTs.   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  He reports fatigue and easy bruising. The patient reports always feeling tired and has a history of tachycardia, for which he takes a beta blocker. The patient also reports trouble sleeping and has a history of scoliosis which causes pain, particularly in cold weather. The patient smokes cigars and consumes alcohol very infrequently.   He denies recent chest pain on exertion, shortness of breath on minimal exertion, pre-syncopal episodes, or palpitations.   He had not noticed any recent bleeding such as epistaxis, hematuria or hematochezia.  Chronic anemia with macrocytosis.   Hemoglobin improved from 10.9 in November to 12. MCV was elevated at 110, now 103.6.   Possible causes include vitamin B12 or folic  acid deficiency, or malabsorption. Occupational chemical exposure considered.  On his initial consult with [place] on 01/08/2024, labs showed hemoglobin of 11.9, MCV 103.6.  White count 3500 with normal differential.  Platelet count normal at 247,000.  CMP was grossly unremarkable except for mildly elevated AST of 58.  Iron studies showed evidence of iron deficiency with iron decreased at 30, iron saturation decreased at 8%.  Ferritin was 67.  Folic acid level was decreased at 4.4 ng/mL.  Vitamin B12 level was also decreased at 155 pg/mL.  Methylmalonic acid level was within normal limits.  LDH, haptoglobin, INR, PTT, fibrinogen, reticulocyte count were all within normal limits.  Coombs test negative.  Antiparietal cell antibodies and intrinsic factor antibodies were negative, ruling out pernicious anemia.   He was started on vitamin B12, folic acid and iron supplements orally.  REVIEW OF SYSTEMS:    Review of Systems - Oncology  All other pertinent systems were reviewed with the patient and are negative.  I have reviewed the past medical history, past surgical history, social history and family history with the patient and they are unchanged from previous note.  ALLERGIES:  He is allergic to allegra [fexofenadine], darvon [propoxyphene], tessalon [benzonatate], latex, and morphine and codeine.  MEDICATIONS:  Current Outpatient Medications  Medication Sig Dispense Refill   fluticasone (FLONASE) 50 MCG/ACT nasal spray Place 2 sprays into both nostrils daily. (Patient not taking:  Reported on 01/08/2024) 16 g 6   furosemide (LASIX) 20 MG tablet Take 20 mg daily as needed for weight gain of 3 lb overnight or 5 lb in 1 week. (Patient not taking: Reported on 01/08/2024) 90 tablet 3   metoprolol succinate (TOPROL XL) 25 MG 24 hr tablet Take 1.5 tablets (37.5 mg total) by mouth at bedtime. 135 tablet 3   Multiple Vitamin (MULTIVITAMIN) tablet Take 1 tablet by mouth daily.     omeprazole (PRILOSEC OTC) 20 MG  tablet Take 40 mg by mouth in the morning and at bedtime.     No current facility-administered medications for this visit.    PHYSICAL EXAMINATION:     LABORATORY DATA:   I have reviewed the data as listed.  Recent Results (from the past 2160 hours)  Vitamin D (25 hydroxy)     Status: Abnormal   Collection Time: 12/22/23 10:24 AM  Result Value Ref Range   VITD 14.56 (L) 30.00 - 100.00 ng/mL  Hepatic function panel     Status: Abnormal   Collection Time: 12/22/23 10:24 AM  Result Value Ref Range   Total Bilirubin 1.2 0.2 - 1.2 mg/dL   Bilirubin, Direct 0.3 0.0 - 0.3 mg/dL   Alkaline Phosphatase 99 39 - 117 U/L   AST 72 (H) 0 - 37 U/L   ALT 41 0 - 53 U/L   Total Protein 7.1 6.0 - 8.3 g/dL   Albumin 4.1 3.5 - 5.2 g/dL  IBC + Ferritin     Status: Abnormal   Collection Time: 12/22/23 10:24 AM  Result Value Ref Range   Iron 205 (H) 42 - 165 ug/dL   Transferrin 161.0 960.4 - 360.0 mg/dL   Saturation Ratios 54.0 (H) 20.0 - 50.0 %   Ferritin 78.7 22.0 - 322.0 ng/mL   TIBC 387.8 250.0 - 450.0 mcg/dL  Protime-INR     Status: Abnormal   Collection Time: 12/22/23 10:24 AM  Result Value Ref Range   INR 1.1 (H) 0.8 - 1.0 ratio   Prothrombin Time 11.4 9.6 - 13.1 sec  CBC with Differential/Platelet     Status: Abnormal   Collection Time: 12/22/23 10:24 AM  Result Value Ref Range   WBC 5.5 4.0 - 10.5 K/uL   RBC 3.39 (L) 4.22 - 5.81 Mil/uL   Hemoglobin 12.0 (L) 13.0 - 17.0 g/dL   HCT 98.1 (L) 19.1 - 47.8 %   MCV 105.5 (H) 78.0 - 100.0 fl   MCHC 33.6 30.0 - 36.0 g/dL   RDW 29.5 (H) 62.1 - 30.8 %   Platelets 204.0 150.0 - 400.0 K/uL   Neutrophils Relative % 58.2 43.0 - 77.0 %   Lymphocytes Relative 24.6 12.0 - 46.0 %   Monocytes Relative 14.1 (H) 3.0 - 12.0 %   Eosinophils Relative 1.9 0.0 - 5.0 %   Basophils Relative 1.2 0.0 - 3.0 %   Neutro Abs 3.2 1.4 - 7.7 K/uL   Lymphs Abs 1.4 0.7 - 4.0 K/uL   Monocytes Absolute 0.8 0.1 - 1.0 K/uL   Eosinophils Absolute 0.1 0.0 - 0.7 K/uL    Basophils Absolute 0.1 0.0 - 0.1 K/uL  Direct antiglobulin test (not at [facility])     Status: None   Collection Time: 01/08/24  2:24 PM  Result Value Ref Range   DAT, complement NEG    DAT, IgG      NEG Performed at [facility], [address] [place], [place] [address]   Reticulocytes  Status: Abnormal   Collection Time: 01/08/24  2:51 PM  Result Value Ref Range   Retic Ct Pct 2.0 0.4 - 3.1 %   RBC. 3.35 (L) 4.22 - 5.81 MIL/uL   Retic Count, Absolute 66.0 19.0 - 186.0 K/uL   Immature Retic Fract 11.8 2.3 - 15.9 %    Comment: Performed at [facility], [address] [place], [place] [address]  Folate     Status: Abnormal   Collection Time: 01/08/24  2:51 PM  Result Value Ref Range   Folate 4.4 (L) >5.9 ng/mL    Comment: Performed at [facility], [address], [place], [place] [address]  Ferritin     Status: None   Collection Time: 01/08/24  2:51 PM  Result Value Ref Range   Ferritin 67 24 - 336 ng/mL    Comment: Performed at [facility], [address] [place], [place] [address]  Fibrinogen     Status: None   Collection Time: 01/08/24  2:52 PM  Result Value Ref Range   Fibrinogen 320 210 - 475 mg/dL    Comment: (NOTE) Fibrinogen results may be underestimated in patients receiving thrombolytic therapy. Performed at [facility], [address] [place], [place] [address]   APTT     Status: None   Collection Time: 01/08/24  2:52 PM  Result Value Ref Range   aPTT 28 24 - 36 seconds    Comment: Performed at [facility], [address], [place], [place] [address]  Protime-INR     Status: None   Collection Time: 01/08/24  2:52 PM  Result Value Ref Range   Prothrombin Time 13.6 11.4 - 15.2 seconds   INR 1.0 0.8 - 1.2    Comment: (NOTE) INR goal varies based on device and disease states. Performed at [facility], [address] [place], [place]  [address]   Intrinsic Factor Antibodies     Status: None   Collection Time: 01/08/24  2:52 PM  Result Value Ref Range   Intrinsic Factor 1.1 0.0 - 1.1 AU/mL    Comment: (NOTE) Performed At: [facility] [address] [place], [place] [id number] Jolene Schimke MD [id number]   Anti-parietal antibody     Status: None   Collection Time: 01/08/24  2:52 PM  Result Value Ref Range   Parietal Cell Antibody-IgG 8.5 0.0 - 20.0 Units    Comment: (NOTE)                                Negative    0.0 - 20.0                                Equivocal  20.1 - 24.9                                Positive         >24.9 Parietal Cell Antibodies are found in 90% of patients with pernicious anemia and 30% of first degree relatives with pernicious anemia. Performed At: [facility] [address] [place], [place] [id number] Jolene Schimke MD [id number]   Haptoglobin     Status: None   Collection Time: 01/08/24  2:52 PM  Result Value Ref Range   Haptoglobin 156 23 - 355 mg/dL  Comment: (NOTE) Performed At: [facility] [address] [place], [place] [id number] Jolene Schimke MD [id number]   Lactate dehydrogenase     Status: Abnormal   Collection Time: 01/08/24  2:52 PM  Result Value Ref Range   LDH 197 (H) 98 - 192 U/L    Comment: Performed at [facility], [address] [place], [place], [place] [address]  Methylmalonic acid, serum     Status: None   Collection Time: 01/08/24  2:52 PM  Result Value Ref Range   Methylmalonic Acid, Quantitative 246 0 - 378 nmol/L    Comment: (NOTE) This test was developed and its performance characteristics determined by [facility]. It has not been cleared or approved by the Food and Drug Administration. Performed At: [facility] [address] [place], [place] [id number] Jolene Schimke MD KG:[id number]   Vitamin B12     Status: Abnormal   Collection Time: 01/08/24  2:52 PM  Result Value Ref Range    Vitamin B-12 155 (L) 180 - 914 pg/mL    Comment: (NOTE) This assay is not validated for testing neonatal or myeloproliferative syndrome specimens for Vitamin B12 levels. Performed at [facility], [address], [place], [place] [address]   Iron and TIBC     Status: Abnormal   Collection Time: 01/08/24  2:52 PM  Result Value Ref Range   Iron 30 (L) 45 - 182 ug/dL   TIBC 474 259 - 563 ug/dL   Saturation Ratios 8 (L) 17.9 - 39.5 %   UIBC 337 ug/dL    Comment: Performed at [facility], [address] [place], [place] [address]  CMP (Cancer Center only)     Status: Abnormal   Collection Time: 01/08/24  2:52 PM  Result Value Ref Range   Sodium 137 135 - 145 mmol/L   Potassium 3.7 3.5 - 5.1 mmol/L   Chloride 103 98 - 111 mmol/L   CO2 26 22 - 32 mmol/L   Glucose, Bld 103 (H) 70 - 99 mg/dL    Comment: Glucose reference range applies only to samples taken after fasting for at least 8 hours.   BUN 16 6 - 20 mg/dL   Creatinine 3.32 9.51 - 1.24 mg/dL   Calcium 8.7 (L) 8.9 - 10.3 mg/dL   Total Protein 7.0 6.5 - 8.1 g/dL   Albumin 3.9 3.5 - 5.0 g/dL   AST 58 (H) 15 - 41 U/L   ALT 40 0 - 44 U/L   Alkaline Phosphatase 83 38 - 126 U/L   Total Bilirubin 0.3 0.0 - 1.2 mg/dL   GFR, Estimated >88 >41 mL/min    Comment: (NOTE) Calculated using the CKD-EPI Creatinine Equation (2021)    Anion gap 8 5 - 15    Comment: Performed at [facility], [address] [place] [place], [place] [address]  CBC with Differential (Cancer Center Only)     Status: Abnormal   Collection Time: 01/08/24  2:52 PM  Result Value Ref Range   WBC Count 3.5 (L) 4.0 - 10.5 K/uL   RBC 3.36 (L) 4.22 - 5.81 MIL/uL   Hemoglobin 11.9 (L) 13.0 - 17.0 g/dL   HCT 01.6 (L) 01.0 - 93.2 %   MCV 103.6 (H) 80.0 - 100.0 fL   MCH 35.4 (H) 26.0 - 34.0 pg   MCHC 34.2 30.0 - 36.0 g/dL   RDW 35.5 (H) 73.2 - 20.2 %   Platelet Count 247 150 - 400 K/uL   nRBC 0.0  0.0 - 0.2 %   Neutrophils Relative % 43 %    Neutro Abs 1.5 (L) 1.7 - 7.7 K/uL   Lymphocytes Relative 39 %   Lymphs Abs 1.3 0.7 - 4.0 K/uL   Monocytes Relative 14 %   Monocytes Absolute 0.5 0.1 - 1.0 K/uL   Eosinophils Relative 2 %   Eosinophils Absolute 0.1 0.0 - 0.5 K/uL   Basophils Relative 2 %   Basophils Absolute 0.1 0.0 - 0.1 K/uL   Immature Granulocytes 0 %   Abs Immature Granulocytes 0.00 0.00 - 0.07 K/uL    Comment: Performed at [facility], [address], [place], [place] [address]     RADIOGRAPHIC STUDIES:  No recent pertinent imaging studies available to review.  Orders Placed This Encounter  Procedures   CBC with Differential (Cancer Center Only)    Standing Status:   Future    Expected Date:   04/02/2024    Expiration Date:   01/21/2025   CMP (Cancer Center only)    Standing Status:   Future    Expected Date:   04/02/2024    Expiration Date:   01/21/2025   Iron and Iron Binding Capacity ([facility] only)    Standing Status:   Future    Expected Date:   04/02/2024    Expiration Date:   01/21/2025   Ferritin    Standing Status:   Future    Expected Date:   04/02/2024    Expiration Date:   01/21/2025   Folate    Standing Status:   Future    Expected Date:   04/02/2024    Expiration Date:   01/21/2025   Vitamin B12    Standing Status:   Future    Expected Date:   04/02/2024    Expiration Date:   01/21/2025     Future Appointments  Date Time Provider Department Center  03/13/2024  1:10 PM de [place], Raymond J, MD [facility]  04/02/2024  2:15 PM [facility] PHLEBOTOMIST [facility] None  04/02/2024  2:30 PM Montey Ebel, Archie Patten, MD [facility] None    This document was completed utilizing speech recognition software. Grammatical errors, random word insertions, pronoun errors, and incomplete sentences are an occasional consequence of this system due to software limitations, ambient noise, and hardware issues. Any formal questions or concerns about the content, text or information contained within the body of this  dictation should be directly addressed to the provider for clarification.

## 2024-01-20 ENCOUNTER — Encounter (HOSPITAL_COMMUNITY): Payer: Self-pay

## 2024-01-20 ENCOUNTER — Emergency Department (HOSPITAL_COMMUNITY)
Admission: EM | Admit: 2024-01-20 | Discharge: 2024-01-21 | Disposition: A | Discharge status: Home / Self Care | Attending: Emergency Medicine | Admitting: Emergency Medicine

## 2024-01-20 ENCOUNTER — Other Ambulatory Visit: Payer: Self-pay

## 2024-01-20 DIAGNOSIS — F10129 Alcohol abuse with intoxication, unspecified: Secondary | ICD-10-CM | POA: Insufficient documentation

## 2024-01-20 DIAGNOSIS — Z91040 Latex allergy status: Secondary | ICD-10-CM | POA: Diagnosis not present

## 2024-01-20 DIAGNOSIS — S060XAA Concussion with loss of consciousness status unknown, initial encounter: Secondary | ICD-10-CM | POA: Insufficient documentation

## 2024-01-20 DIAGNOSIS — Z23 Encounter for immunization: Secondary | ICD-10-CM | POA: Insufficient documentation

## 2024-01-20 DIAGNOSIS — W1830XA Fall on same level, unspecified, initial encounter: Secondary | ICD-10-CM | POA: Insufficient documentation

## 2024-01-20 DIAGNOSIS — S0993XA Unspecified injury of face, initial encounter: Secondary | ICD-10-CM

## 2024-01-20 DIAGNOSIS — F10920 Alcohol use, unspecified with intoxication, uncomplicated: Secondary | ICD-10-CM

## 2024-01-20 DIAGNOSIS — S00212A Abrasion of left eyelid and periocular area, initial encounter: Secondary | ICD-10-CM | POA: Diagnosis present

## 2024-01-20 NOTE — ED Triage Notes (Signed)
 Pt BIB [facility] from [facility] after witnessed ground level fall. +ETOH. Small lac above L eyebrow. No thinners. No LOC.  1401/80 HR 80 99%RA Cbg 108

## 2024-01-21 ENCOUNTER — Emergency Department (HOSPITAL_COMMUNITY)

## 2024-01-21 DIAGNOSIS — S060XAA Concussion with loss of consciousness status unknown, initial encounter: Secondary | ICD-10-CM | POA: Diagnosis not present

## 2024-01-21 MED ORDER — TETANUS-DIPHTH-ACELL PERTUSSIS 5-2.5-18.5 LF-MCG/0.5 IM SUSY
0.5000 mL | PREFILLED_SYRINGE | Freq: Once | INTRAMUSCULAR | Status: AC
  Administered 2024-01-21: 0.5 mL via INTRAMUSCULAR
  Filled 2024-01-21: qty 0.5

## 2024-01-21 MED ORDER — SODIUM CHLORIDE (PF) 0.9 % IJ SOLN
INTRAMUSCULAR | Status: AC
  Filled 2024-01-21: qty 50

## 2024-01-21 NOTE — ED Notes (Signed)
 Provider confirmed pt was stable and ok to be sent to lobby to wait for ride,  Pt attempting to contact ride again,  V/S within range.  Pt stated "he didn't know why he is blacking out".

## 2024-01-21 NOTE — ED Provider Notes (Signed)
 Patient now more alert, says he does not know what happened. I informed him that all of his imaging is negative.  He is calling for a ride   Zadie Rhine, MD 01/21/24 [phone]

## 2024-01-21 NOTE — ED Notes (Signed)
 Pt has been made aware that he can be discharged after safe ride home is arranged. States he will call a cab, then falls asleep.

## 2024-01-21 NOTE — ED Provider Notes (Signed)
 [place] EMERGENCY DEPARTMENT AT [facility] Provider Note   CSN: [id number] Arrival date & time: 01/20/24  2321     History  Chief Complaint  Patient presents with   Fall   Alcohol Intoxication   Level 5 caveat due to intoxication Jacob Davila is a 48 y.o. male.  The history is provided by the patient.  Alcohol Intoxication  Patient presents after a fall.  Patient called for a local laundromat after was reported he had a ground-level fall.  Patient is noted to be intoxicated.  He has a small abrasion above his left eye Patient is sleeping during exam    Past Medical History:  Diagnosis Date   Allergy    Scoliosis    - s/p correctvie Sgx   Tachycardia    Venous insufficiency of both lower extremities 05/2021   Lower extremity venous Doppler 05/29/2021: No evidence of DVT.  Venous reflux noted in the right GSV in the calf.  Venous reflux noted in the left common femoral vein and left greater saphenous vein in the thigh.  Left GSV in the calf as well as a left perforator vein. => Forwarded to vascular surgery:    Home Medications Prior to Admission medications   Medication Sig Start Date End Date Taking? Authorizing Provider  cyanocobalamin (VITAMIN B12) 1000 MCG tablet Take 1 tablet (1,000 mcg total) by mouth daily. 01/17/24   Pasam, Archie Patten, MD  ferrous sulfate 325 (65 FE) MG EC tablet Take 1 tablet (325 mg total) by mouth daily with breakfast. 01/17/24   Pasam, Avinash, MD  fluticasone (FLONASE) 50 MCG/ACT nasal spray Place 2 sprays into both nostrils daily. Patient not taking: Reported on 01/08/2024 07/13/23   Hilbert Bible, FNP  folic acid (FOLVITE) 1 MG tablet Take 1 tablet (1 mg total) by mouth daily. 01/17/24   Pasam, Archie Patten, MD  furosemide (LASIX) 20 MG tablet Take 20 mg daily as needed for weight gain of 3 lb overnight or 5 lb in 1 week. Patient not taking: Reported on 01/08/2024 10/02/23   Reather Littler D, NP  metoprolol succinate (TOPROL XL) 25 MG  24 hr tablet Take 1.5 tablets (37.5 mg total) by mouth at bedtime. 10/02/23   Reather Littler D, NP  Multiple Vitamin (MULTIVITAMIN) tablet Take 1 tablet by mouth daily.    [provider]  omeprazole (PRILOSEC OTC) 20 MG tablet Take 40 mg by mouth in the morning and at bedtime.    [provider]      Allergies    Allegra [fexofenadine], Darvon [propoxyphene], Tessalon [benzonatate], Latex, and Morphine and codeine    Review of Systems   Review of Systems  Physical Exam Updated Vital Signs BP 100/68 (BP Location: Left Arm)   Pulse 90   Temp 97.7 F (36.5 C) (Oral)   Resp 14   Ht 1.803 m (5\' 11" )   Wt 72.6 kg   SpO2 97%   BMI 22.32 kg/m  Physical Exam CONSTITUTIONAL disheveled, appears intoxicated HEAD: Patient above the left eyebrow, no other signs of head trauma EYES: EOMI/PERRL Left periorbital bruising.  No crepitus, no proptosis No globe injury No hyphema ENMT: Cervical collar in place SPINE/BACK:entire spine nontender CV: S1/S2 noted, no murmurs/rubs/gallops noted LUNGS: Lungs are clear to auscultation bilaterally, no apparent distress Chest-no bruising or tenderness ABDOMEN: soft, nontender NEURO: Pt is somnolent but arousable, moves all extremities x 4, appears intoxicated EXTREMITIES: pulses normal/equal, full ROM No deformities  ED Results / Procedures / Treatments  Labs (all labs ordered are listed, but only abnormal results are displayed) Labs Reviewed - No data to display  EKG None  Radiology CT Head Wo Contrast Result Date: 01/21/2024 CLINICAL DATA:  Neck trauma, intoxicated or obtained. Head and neck trauma. EXAM: CT HEAD WITHOUT CONTRAST CT MAXILLOFACIAL WITHOUT CONTRAST CT CERVICAL SPINE WITHOUT CONTRAST TECHNIQUE: Multidetector CT imaging of the head, cervical spine, and maxillofacial structures were performed using the standard protocol without intravenous contrast. Multiplanar CT image reconstructions of the cervical spine and  maxillofacial structures were also generated. RADIATION DOSE REDUCTION: This exam was performed according to the departmental dose-optimization program which includes automated exposure control, adjustment of the mA and/or kV according to patient size and/or use of iterative reconstruction technique. COMPARISON:  None Available. FINDINGS: CT HEAD FINDINGS Brain: No evidence of acute infarction, hemorrhage, hydrocephalus, extra-axial collection or mass lesion/mass effect. Vascular: No hyperdense vessel or unexpected calcification. Skull: Normal. Negative for fracture or focal lesion. CT MAXILLOFACIAL FINDINGS Osseous: No fracture or mandibular dislocation. No destructive process. Orbits: Negative. No traumatic or inflammatory finding. Sinuses: No hemosinus Soft tissues: Contusion to the left cheek. CT CERVICAL SPINE FINDINGS Alignment: No traumatic malalignment Skull base and vertebrae: No acute fracture. No primary bone lesion or focal pathologic process. Soft tissues and spinal canal: No prevertebral fluid or swelling. No visible canal hematoma. Disc levels: Degenerative spurring in the lower cervical spine. T1-2 posterior fusion. Upper chest: No evidence of injury IMPRESSION: No evidence of acute intracranial or cervical spine injury. Negative for facial fracture. Electronically Signed   By: Tiburcio Pea M.D.   On: 01/21/2024 04:20   CT Cervical Spine Wo Contrast Result Date: 01/21/2024 CLINICAL DATA:  Neck trauma, intoxicated or obtained. Head and neck trauma. EXAM: CT HEAD WITHOUT CONTRAST CT MAXILLOFACIAL WITHOUT CONTRAST CT CERVICAL SPINE WITHOUT CONTRAST TECHNIQUE: Multidetector CT imaging of the head, cervical spine, and maxillofacial structures were performed using the standard protocol without intravenous contrast. Multiplanar CT image reconstructions of the cervical spine and maxillofacial structures were also generated. RADIATION DOSE REDUCTION: This exam was performed according to the departmental  dose-optimization program which includes automated exposure control, adjustment of the mA and/or kV according to patient size and/or use of iterative reconstruction technique. COMPARISON:  None Available. FINDINGS: CT HEAD FINDINGS Brain: No evidence of acute infarction, hemorrhage, hydrocephalus, extra-axial collection or mass lesion/mass effect. Vascular: No hyperdense vessel or unexpected calcification. Skull: Normal. Negative for fracture or focal lesion. CT MAXILLOFACIAL FINDINGS Osseous: No fracture or mandibular dislocation. No destructive process. Orbits: Negative. No traumatic or inflammatory finding. Sinuses: No hemosinus Soft tissues: Contusion to the left cheek. CT CERVICAL SPINE FINDINGS Alignment: No traumatic malalignment Skull base and vertebrae: No acute fracture. No primary bone lesion or focal pathologic process. Soft tissues and spinal canal: No prevertebral fluid or swelling. No visible canal hematoma. Disc levels: Degenerative spurring in the lower cervical spine. T1-2 posterior fusion. Upper chest: No evidence of injury IMPRESSION: No evidence of acute intracranial or cervical spine injury. Negative for facial fracture. Electronically Signed   By: Tiburcio Pea M.D.   On: 01/21/2024 04:20   CT Maxillofacial Wo Contrast Result Date: 01/21/2024 CLINICAL DATA:  Neck trauma, intoxicated or obtained. Head and neck trauma. EXAM: CT HEAD WITHOUT CONTRAST CT MAXILLOFACIAL WITHOUT CONTRAST CT CERVICAL SPINE WITHOUT CONTRAST TECHNIQUE: Multidetector CT imaging of the head, cervical spine, and maxillofacial structures were performed using the standard protocol without intravenous contrast. Multiplanar CT image reconstructions of the cervical spine and maxillofacial structures were also  generated. RADIATION DOSE REDUCTION: This exam was performed according to the departmental dose-optimization program which includes automated exposure control, adjustment of the mA and/or kV according to patient size  and/or use of iterative reconstruction technique. COMPARISON:  None Available. FINDINGS: CT HEAD FINDINGS Brain: No evidence of acute infarction, hemorrhage, hydrocephalus, extra-axial collection or mass lesion/mass effect. Vascular: No hyperdense vessel or unexpected calcification. Skull: Normal. Negative for fracture or focal lesion. CT MAXILLOFACIAL FINDINGS Osseous: No fracture or mandibular dislocation. No destructive process. Orbits: Negative. No traumatic or inflammatory finding. Sinuses: No hemosinus Soft tissues: Contusion to the left cheek. CT CERVICAL SPINE FINDINGS Alignment: No traumatic malalignment Skull base and vertebrae: No acute fracture. No primary bone lesion or focal pathologic process. Soft tissues and spinal canal: No prevertebral fluid or swelling. No visible canal hematoma. Disc levels: Degenerative spurring in the lower cervical spine. T1-2 posterior fusion. Upper chest: No evidence of injury IMPRESSION: No evidence of acute intracranial or cervical spine injury. Negative for facial fracture. Electronically Signed   By: Tiburcio Pea M.D.   On: 01/21/2024 04:20    Procedures Procedures    Medications Ordered in ED Medications  Tdap (BOOSTRIX) injection 0.5 mL (0.5 mLs Intramuscular Given 01/21/24 0205)    ED Course/ Medical Decision Making/ A&P                                 Medical Decision Making Amount and/or Complexity of Data Reviewed Radiology: ordered.  Risk Prescription drug management.   This patient presents to the ED for concern of fall with head injury, this involves an extensive number of treatment options, and is a complaint that carries with it a high risk of complications and morbidity.  The differential diagnosis includes but is not limited to subdural hematoma, subarachnoid hemorrhage, skull fracture, concussion   Comorbidities that complicate the patient evaluation: Patient's presentation is complicated by their history of liver  disease  Social Determinants of Health: Patient's  alcohol use   increases the complexity of managing their presentation  Additional history obtained: Records reviewed  outpatient GI results noted   Imaging Studies ordered: I ordered imaging studies including CT scan head, maxillofacial, C-spine   I independently visualized and interpreted imaging which showed no acute findings I agree with the radiologist interpretation   Reevaluation: After the interventions noted above, I reevaluated the patient and found that they have :improved  Complexity of problems addressed: Patient's presentation is most consistent with  acute presentation with potential threat to life or bodily function  Disposition: After consideration of the diagnostic results and the patient's response to treatment,  I feel that the patent would benefit from discharge   .    Patient presented via EMS intoxicated no evidence of head trauma.  CT imaging of head neck and face are unremarkable Patient is now more alert, and he will work on finding a ride home. No other signs of acute traumatic injury to his chest, abdomen or back or extremities       Final Clinical Impression(s) / ED Diagnoses Final diagnoses:  Alcoholic intoxication without complication (HCC)  Concussion with unknown loss of consciousness status, initial encounter  Facial injury, initial encounter    Rx / DC Orders ED Discharge Orders     None         Zadie Rhine, MD 01/21/24 [phone]

## 2024-01-21 NOTE — ED Notes (Signed)
 Awoke pt again, and he states he will call a friend named Haven. More awake and actually making the phonecall at this time

## 2024-01-21 NOTE — ED Notes (Signed)
 Patient transported to CT

## 2024-01-22 ENCOUNTER — Encounter: Payer: Self-pay | Admitting: Oncology

## 2024-01-22 DIAGNOSIS — E538 Deficiency of other specified B group vitamins: Secondary | ICD-10-CM | POA: Insufficient documentation

## 2024-01-22 DIAGNOSIS — D519 Vitamin B12 deficiency anemia, unspecified: Secondary | ICD-10-CM | POA: Insufficient documentation

## 2024-01-22 DIAGNOSIS — D509 Iron deficiency anemia, unspecified: Secondary | ICD-10-CM | POA: Insufficient documentation

## 2024-01-22 NOTE — Assessment & Plan Note (Signed)
 PT and PTT, fibrinogen were all within normal limits on 01/08/2024.  Will consider additional testing if symptoms were to persist.  Clinical suspicion low for platelet disorders or von Willebrand disease.

## 2024-01-22 NOTE — Assessment & Plan Note (Signed)
 Chronic anemia with macrocytosis.  Hemoglobin improved from 10.9 in November to 12. MCV was elevated at 110, now 103.6.  Possible causes include vitamin B12 or folic acid deficiency, or malabsorption. Occupational chemical exposure considered.  On his initial consult with [place] on 01/08/2024, labs showed hemoglobin of 11.9, MCV 103.6.  White count 3500 with normal differential.  Platelet count normal at 247,000.  CMP was grossly unremarkable except for mildly elevated AST of 58.  Iron studies showed evidence of iron deficiency with iron decreased at 30, iron saturation decreased at 8%.  Ferritin was 67.  Folic acid level was decreased at 4.4 ng/mL.  Vitamin B12 level was also decreased at 155 pg/mL.  Methylmalonic acid level was within normal limits.  LDH, haptoglobin, INR, PTT, fibrinogen, reticulocyte count were all within normal limits.  Coombs test negative. Antiparietal cell antibodies and intrinsic factor antibodies were negative, ruling out pernicious anemia.   He was started on vitamin B12, folic acid and iron supplements orally.  - Plan follow-up visit in three months to repeat blood work

## 2024-03-13 ENCOUNTER — Ambulatory Visit (HOSPITAL_BASED_OUTPATIENT_CLINIC_OR_DEPARTMENT_OTHER): Payer: Medicaid Other | Admitting: Family Medicine

## 2024-03-13 VITALS — BP 124/81 | HR 88 | Ht 71.0 in | Wt 155.5 lb

## 2024-03-13 DIAGNOSIS — D519 Vitamin B12 deficiency anemia, unspecified: Secondary | ICD-10-CM | POA: Diagnosis not present

## 2024-03-13 DIAGNOSIS — K760 Fatty (change of) liver, not elsewhere classified: Secondary | ICD-10-CM

## 2024-03-13 DIAGNOSIS — Z0000 Encounter for general adult medical examination without abnormal findings: Secondary | ICD-10-CM

## 2024-03-13 NOTE — Assessment & Plan Note (Signed)
 Currently does follow with GI with follow-up appointment scheduled.  Has had elastography completed in the past with plans to likely repeat this with GI in the future.  No changes to treatment plan at this time, recommend continuing with recommended follow-up with specialist.

## 2024-03-13 NOTE — Patient Instructions (Addendum)
  Medication Instructions:  Your physician recommends that you continue on your current medications as directed. Please refer to the Current Medication list given to you today. --If you need a refill on any your medications before your next appointment, please call your pharmacy first. If no refills are authorized on file call the office.-- Lab Work: Your physician has recommended that you have lab work today: 1 week prior  If you have labs (blood work) drawn today and your tests are completely normal, you will receive your results via MyChart message OR a phone call from our staff.  Please ensure you check your voicemail in the event that you authorized detailed messages to be left on a delegated number. If you have any lab test that is abnormal or we need to change your treatment, we will call you to review the results.    Follow-Up: Your next appointment:   Your physician recommends that you schedule a follow-up appointment in: 6 mthPhysical  with Dr. de Peru  You will receive a text message or e-mail with a link to a survey about your care and experience with us  today! We would greatly appreciate your feedback!   Thanks for letting us  be apart of your health journey!!  Primary Care and Sports Medicine   Dr. Court Distance Peru   We encourage you to activate your patient portal called "MyChart".  Sign up information is provided on this After Visit Summary.  MyChart is used to connect with patients for Virtual Visits (Telemedicine).  Patients are able to view lab/test results, encounter notes, upcoming appointments, etc.  Non-urgent messages can be sent to your provider as well. To learn more about what you can do with MyChart, please visit --  [email or web address].

## 2024-03-13 NOTE — Assessment & Plan Note (Signed)
 Patient is following with hematologist.  He has been taking supplementation for B12 deficiency as well as iron deficiency and folate.  He does feel that symptomatically he has been doing much better with vitamin supplementation.  He does have follow-up arranged with hematologist, encouraged patient to continue with this.  It does appear that follow-up labs have been ordered by specialist as well, encourage patient to have these completed as instructed

## 2024-03-13 NOTE — Progress Notes (Signed)
    Procedures performed today:    None.  Independent interpretation of notes and tests performed by another provider:   None.  Brief History, Exam, Impression, and Recommendations:    BP 124/81 (BP Location: Right Arm, Patient Position: Sitting, Cuff Size: Normal)   Pulse 88   Ht 5\' 11"  (1.803 m)   Wt 155 lb 8 oz (70.5 kg)   SpO2 99%   BMI 21.69 kg/m   Patient did have emergency department visit about 6 weeks ago related to fall.  This was reportedly due to alcohol consumption according to medical provider, however patient indicates that he had "maybe a beer" and that he does not typically drink significant amounts of alcohol.  He generally indicates that he was confused as to medical providers indication of alcohol intoxication as cause.  Review of chart does show that no labs were done at that time, however he did have CT scans completed which were reassuring.  He does not have any other concerns today related to fall or any lingering issues or symptoms from episode.  Did review results of imaging.  Anemia due to vitamin B12 deficiency, unspecified B12 deficiency type Assessment & Plan: Patient is following with hematologist.  He has been taking supplementation for B12 deficiency as well as iron deficiency and folate.  He does feel that symptomatically he has been doing much better with vitamin supplementation.  He does have follow-up arranged with hematologist, encouraged patient to continue with this.  It does appear that follow-up labs have been ordered by specialist as well, encourage patient to have these completed as instructed   Hepatic steatosis Assessment & Plan: Currently does follow with GI with follow-up appointment scheduled.  Has had elastography completed in the past with plans to likely repeat this with GI in the future.  No changes to treatment plan at this time, recommend continuing with recommended follow-up with specialist.   Wellness examination -     CBC with  Differential/Platelet; Future -     Comprehensive metabolic panel with GFR; Future -     Lipid panel; Future -     TSH Rfx on Abnormal to Free T4; Future  Will be due to follow-up with cardiology within the next month or so, recommend that he contact the office in order to schedule this.  No new symptoms or concerns reported at this time.  Return in about 6 months (around 09/12/2024) for CPE with fasting labs 1 week prior.   ___________________________________________ Kathey Simer de [place], MD, ABFM, CAQSM Primary Care and Sports Medicine [facility]

## 2024-03-22 ENCOUNTER — Emergency Department (HOSPITAL_BASED_OUTPATIENT_CLINIC_OR_DEPARTMENT_OTHER)
Admission: EM | Admit: 2024-03-22 | Discharge: 2024-03-22 | Disposition: A | Discharge status: Home / Self Care | Attending: Emergency Medicine | Admitting: Emergency Medicine

## 2024-03-22 ENCOUNTER — Other Ambulatory Visit: Payer: Self-pay

## 2024-03-22 ENCOUNTER — Encounter (HOSPITAL_BASED_OUTPATIENT_CLINIC_OR_DEPARTMENT_OTHER): Payer: Self-pay

## 2024-03-22 DIAGNOSIS — X500XXA Overexertion from strenuous movement or load, initial encounter: Secondary | ICD-10-CM | POA: Insufficient documentation

## 2024-03-22 DIAGNOSIS — Z91040 Latex allergy status: Secondary | ICD-10-CM | POA: Insufficient documentation

## 2024-03-22 DIAGNOSIS — S39012A Strain of muscle, fascia and tendon of lower back, initial encounter: Secondary | ICD-10-CM | POA: Insufficient documentation

## 2024-03-22 DIAGNOSIS — Y990 Civilian activity done for income or pay: Secondary | ICD-10-CM | POA: Insufficient documentation

## 2024-03-22 DIAGNOSIS — S3992XA Unspecified injury of lower back, initial encounter: Secondary | ICD-10-CM | POA: Diagnosis present

## 2024-03-22 MED ORDER — NAPROXEN 500 MG PO TABS: 500.0000 mg | ORAL_TABLET | Freq: Two times a day (BID) | ORAL | 0 refills | Status: AC

## 2024-03-22 MED ORDER — LIDOCAINE 5 % EX PTCH
1.0000 | MEDICATED_PATCH | CUTANEOUS | Status: DC
  Administered 2024-03-22: 1 via TRANSDERMAL
  Filled 2024-03-22: qty 1

## 2024-03-22 MED ORDER — CYCLOBENZAPRINE HCL 10 MG PO TABS: 10.0000 mg | ORAL_TABLET | Freq: Two times a day (BID) | ORAL | 0 refills | Status: DC | PRN

## 2024-03-22 MED ORDER — LIDOCAINE 5 % EX PTCH: 1.0000 | MEDICATED_PATCH | CUTANEOUS | 0 refills | Status: AC

## 2024-03-22 MED ORDER — TRAMADOL HCL 50 MG PO TABS: 50.0000 mg | ORAL_TABLET | Freq: Four times a day (QID) | ORAL | 0 refills | Status: AC | PRN

## 2024-03-22 MED ORDER — CYCLOBENZAPRINE HCL 5 MG PO TABS
5.0000 mg | ORAL_TABLET | Freq: Once | ORAL | Status: AC
  Administered 2024-03-22: 5 mg via ORAL
  Filled 2024-03-22: qty 1

## 2024-03-22 MED ORDER — KETOROLAC TROMETHAMINE 15 MG/ML IJ SOLN
15.0000 mg | Freq: Once | INTRAMUSCULAR | Status: AC
  Administered 2024-03-22: 15 mg via INTRAMUSCULAR
  Filled 2024-03-22: qty 1

## 2024-03-22 NOTE — ED Provider Notes (Signed)
  EMERGENCY DEPARTMENT AT [facility] Provider Note   CSN: [id number] Arrival date & time: 03/22/24  1417     History  Chief Complaint  Patient presents with   Back Pain    Jacob Davila is a 48 y.o. male.  With past medical history of varicose veins, paroxysmal tachycardia, elevated alk phos, history of scoliosis presenting with right sided low back pain and hip pain.  Patient reports that yesterday at work he was carrying heavy boxes repetitively and there is picking things up.  With some mild fatigue and discomfort in lower right back after that.  Today when he woke up he noted significant right sided low back, worse when repositioning himself.  He has no radicular symptoms down his leg.  Has no associated abdominal pain nausea vomiting diarrhea no urinary symptoms. Ambulatory with limp.  Denies saddle anesthesia, loss of bowel or bladder.  No fever no history of IV drug use or cancer.   Back Pain      Home Medications Prior to Admission medications   Medication Sig Start Date End Date Taking? Authorizing Provider  cyanocobalamin  (VITAMIN B12) 1000 MCG tablet Take 1 tablet (1,000 mcg total) by mouth daily. 01/17/24   Pasam, Gale Jude, MD  ferrous sulfate  325 (65 FE) MG EC tablet Take 1 tablet (325 mg total) by mouth daily with breakfast. 01/17/24   Pasam, Avinash, MD  folic acid  (FOLVITE ) 1 MG tablet Take 1 tablet (1 mg total) by mouth daily. 01/17/24   Pasam, Gale Jude, MD  furosemide  (LASIX ) 20 MG tablet Take 20 mg daily as needed for weight gain of 3 lb overnight or 5 lb in 1 week. 10/02/23   West, Katlyn D, NP  metoprolol  succinate (TOPROL  XL) 25 MG 24 hr tablet Take 1.5 tablets (37.5 mg total) by mouth at bedtime. 10/02/23   West, Katlyn D, NP  Multiple Vitamin (MULTIVITAMIN) tablet Take 1 tablet by mouth daily.    [provider]  omeprazole  (PRILOSEC OTC) 20 MG tablet Take 40 mg by mouth in the morning and at bedtime.    [provider]       Allergies    Allegra [fexofenadine], Darvon [propoxyphene], Tessalon [benzonatate], Latex, and Morphine and codeine    Review of Systems   Review of Systems  Musculoskeletal:  Positive for back pain.    Physical Exam Updated Vital Signs BP 101/63   Pulse 92   Temp 98.4 F (36.9 C) (Oral)   Resp 18   Ht 5\' 11"  (1.803 m)   Wt 72.1 kg   SpO2 98%   BMI 22.18 kg/m  Physical Exam Vitals and nursing note reviewed.  Constitutional:      General: He is not in acute distress.    Appearance: He is not toxic-appearing.  HENT:     Head: Normocephalic and atraumatic.  Eyes:     General: No scleral icterus.    Conjunctiva/sclera: Conjunctivae normal.  Cardiovascular:     Rate and Rhythm: Normal rate and regular rhythm.     Pulses: Normal pulses.     Heart sounds: Normal heart sounds.  Pulmonary:     Effort: Pulmonary effort is normal. No respiratory distress.     Breath sounds: Normal breath sounds.  Abdominal:     General: Abdomen is flat. Bowel sounds are normal.     Palpations: Abdomen is soft.     Tenderness: There is no abdominal tenderness.  Musculoskeletal:     Right lower leg: No edema.  Left lower leg: No edema.     Comments: Lower extremity sensation and strength intact. No wound. Strong dorsal pedal pulse.   Skin:    General: Skin is warm and dry.     Findings: No lesion.  Neurological:     General: No focal deficit present.     Mental Status: He is alert and oriented to person, place, and time. Mental status is at baseline.     ED Results / Procedures / Treatments   Labs (all labs ordered are listed, but only abnormal results are displayed) Labs Reviewed - No data to display  EKG None  Radiology No results found.  Procedures Procedures    Medications Ordered in ED Medications  lidocaine  (LIDODERM ) 5 % 1 patch (1 patch Transdermal Patch Applied 03/22/24 1524)  cyclobenzaprine  (FLEXERIL ) tablet 5 mg (5 mg Oral Given 03/22/24 1524)  ketorolac   (TORADOL ) 15 MG/ML injection 15 mg (15 mg Intramuscular Given 03/22/24 1525)    ED Course/ Medical Decision Making/ A&P Clinical Course as of 03/22/24 1601  Fri Mar 22, 2024  1559 Feeling better, requesting discharge.  [JB]    Clinical Course User Index [JB] Ameya Vowell, Kandace Organ, PA-C                                 Medical Decision Making Risk Prescription drug management.   Myrna Ast Ozanich 48 y.o. presented today for back pain. Working Ddx: MSK in nature, fracture, epidural hematoma/abscess, cauda equina syndrome, spinal stenosis, spinal malignancy, discitis, spinal infection, spondylitises/ spondylosis, conus medullaris, DDD of the back.  R/o DDx: Cauda equina syndrome and additional dx are less likely than current impression due to history of present illness, physical exam, labs/imaging findings. No focal neurological deficits, no loss of bowel or bladder control.  Denies fever, night sweats, weight loss, h/o cancer, IVDU.  PMHX: Varicose vein, sinus tachycardia      Problem List / ED Course / Critical interventions / Medication management  Presenting to emergency room with low back pain after doing heavy lifting yesterday.  On exam he has tenderness to right paravertebral musculature.  He has no midline tenderness step-off or deformity.  He has no weakness of lower extremity and no lower extremity loss of sensation.  No red flag symptoms associated with back pain.  No injury trauma or fall associated with this. Given reassuring history and no fall do not feel imaging is needed at this time. Will pain control and reassess.  Stable throughout stay.  Feeling better after symptomatic management.  No red flag symptoms associate with back pain.  Feel he is appropriate for discharge with outpatient follow-up. I ordered medication including Flexeril , Toradol , Lidoderm   Reevaluation of the patient after these medicines showed that the patient improved Patients vitals assessed. Upon arrival patient  is hemodynamically stable.  I have reviewed the patients home medicines and have made adjustments as needed   Plan:  F/u w/ PCP in 2-3d to ensure resolution of sx.  RICE protocol and pain medicine discussed with patient.  Patient was given return precautions. Patient stable for discharge at this time.  Patient educated on sx/dx and verbalized understanding of plan. Return to ER with new or worsening sx.          Final Clinical Impression(s) / ED Diagnoses Final diagnoses:  Strain of lumbar region, initial encounter    Rx / DC Orders ED Discharge Orders  Ordered    traMADol (ULTRAM) 50 MG tablet  Every 6 hours PRN        03/22/24 1530    cyclobenzaprine  (FLEXERIL ) 10 MG tablet  2 times daily PRN        03/22/24 1530    naproxen (NAPROSYN) 500 MG tablet  2 times daily        03/22/24 1530    lidocaine  (LIDODERM ) 5 %  Every 24 hours        03/22/24 1530              Florian Chauca, Kandace Organ, PA-C 03/22/24 1601    Albertus Hughs, DO 03/25/24 0701

## 2024-03-22 NOTE — ED Triage Notes (Signed)
 Onset with waking up of left lower/ hip pain.  Present in wheel chair.  Previous back issues

## 2024-03-22 NOTE — Discharge Instructions (Addendum)
 Please take Naproxen twice daily. Take 1000mg  of Tylenol  every 6 hours. You can use ice and heat over area of pain and lidoderm  patch. You can use Flexeril  as needed for muscle spasm and Tramadol for breakthrough pain -- do not use when driving or operating heavy machinery. Follow up with PCP, return to ER with new or worsening symptoms.

## 2024-03-25 ENCOUNTER — Encounter: Payer: Self-pay | Admitting: Cardiology

## 2024-03-25 ENCOUNTER — Telehealth: Payer: Self-pay | Admitting: Cardiology

## 2024-03-25 NOTE — Telephone Encounter (Signed)
 Paper Work Dropped Off: Plasma clearance   Date:03/25/2024  Location of paper:  Dr. Addie Holstein mailbox

## 2024-03-27 NOTE — Telephone Encounter (Signed)
 Patient received the form today 03/27/24

## 2024-04-02 ENCOUNTER — Inpatient Hospital Stay: Payer: 59 | Attending: Oncology

## 2024-04-02 ENCOUNTER — Encounter: Payer: Self-pay | Admitting: Oncology

## 2024-04-02 ENCOUNTER — Inpatient Hospital Stay (HOSPITAL_BASED_OUTPATIENT_CLINIC_OR_DEPARTMENT_OTHER): Payer: 59 | Admitting: Oncology

## 2024-04-02 VITALS — BP 107/73 | HR 75 | Temp 98.3°F | Resp 18 | Ht 71.0 in | Wt 157.2 lb

## 2024-04-02 DIAGNOSIS — D539 Nutritional anemia, unspecified: Secondary | ICD-10-CM | POA: Insufficient documentation

## 2024-04-02 DIAGNOSIS — Z79899 Other long term (current) drug therapy: Secondary | ICD-10-CM | POA: Insufficient documentation

## 2024-04-02 DIAGNOSIS — R233 Spontaneous ecchymoses: Secondary | ICD-10-CM | POA: Diagnosis not present

## 2024-04-02 DIAGNOSIS — D509 Iron deficiency anemia, unspecified: Secondary | ICD-10-CM

## 2024-04-02 DIAGNOSIS — E538 Deficiency of other specified B group vitamins: Secondary | ICD-10-CM | POA: Diagnosis not present

## 2024-04-02 DIAGNOSIS — D519 Vitamin B12 deficiency anemia, unspecified: Secondary | ICD-10-CM

## 2024-04-02 DIAGNOSIS — E611 Iron deficiency: Secondary | ICD-10-CM | POA: Diagnosis not present

## 2024-04-02 DIAGNOSIS — Z791 Long term (current) use of non-steroidal anti-inflammatories (NSAID): Secondary | ICD-10-CM | POA: Insufficient documentation

## 2024-04-02 LAB — CBC WITH DIFFERENTIAL (CANCER CENTER ONLY)
Abs Immature Granulocytes: 0.01 10*3/uL (ref 0.00–0.07)
Basophils Absolute: 0.1 10*3/uL (ref 0.0–0.1)
Basophils Relative: 2 %
Eosinophils Absolute: 0.2 10*3/uL (ref 0.0–0.5)
Eosinophils Relative: 4 %
HCT: 37 % — ABNORMAL LOW (ref 39.0–52.0)
Hemoglobin: 12.3 g/dL — ABNORMAL LOW (ref 13.0–17.0)
Immature Granulocytes: 0 %
Lymphocytes Relative: 49 %
Lymphs Abs: 2.1 10*3/uL (ref 0.7–4.0)
MCH: 34.2 pg — ABNORMAL HIGH (ref 26.0–34.0)
MCHC: 33.2 g/dL (ref 30.0–36.0)
MCV: 102.8 fL — ABNORMAL HIGH (ref 80.0–100.0)
Monocytes Absolute: 0.5 10*3/uL (ref 0.1–1.0)
Monocytes Relative: 13 %
Neutro Abs: 1.3 10*3/uL — ABNORMAL LOW (ref 1.7–7.7)
Neutrophils Relative %: 32 %
Platelet Count: 305 10*3/uL (ref 150–400)
RBC: 3.6 MIL/uL — ABNORMAL LOW (ref 4.22–5.81)
RDW: 12.7 % (ref 11.5–15.5)
WBC Count: 4.3 10*3/uL (ref 4.0–10.5)
nRBC: 0 % (ref 0.0–0.2)

## 2024-04-02 LAB — IRON AND TIBC
Iron: 38 ug/dL — ABNORMAL LOW (ref 45–182)
Saturation Ratios: 9 % — ABNORMAL LOW (ref 17.9–39.5)
TIBC: 407 ug/dL (ref 250–450)
UIBC: 369 ug/dL

## 2024-04-02 LAB — FERRITIN: Ferritin: 70 ng/mL (ref 24–336)

## 2024-04-02 LAB — CMP (CANCER CENTER ONLY)
ALT: 30 U/L (ref 0–44)
AST: 41 U/L (ref 15–41)
Albumin: 3.8 g/dL (ref 3.5–5.0)
Alkaline Phosphatase: 91 U/L (ref 38–126)
Anion gap: 10 (ref 5–15)
BUN: 14 mg/dL (ref 6–20)
CO2: 26 mmol/L (ref 22–32)
Calcium: 9.4 mg/dL (ref 8.9–10.3)
Chloride: 101 mmol/L (ref 98–111)
Creatinine: 1.23 mg/dL (ref 0.61–1.24)
GFR, Estimated: >60 mL/min (ref >60)
Glucose, Bld: 72 mg/dL (ref 70–99)
Potassium: 4.7 mmol/L (ref 3.5–5.1)
Sodium: 137 mmol/L (ref 135–145)
Total Bilirubin: 0.3 mg/dL (ref 0.0–1.2)
Total Protein: 6.6 g/dL (ref 6.5–8.1)

## 2024-04-02 LAB — FOLATE: Folate: 10.2 ng/mL (ref >5.9)

## 2024-04-02 LAB — VITAMIN B12: Vitamin B-12: 136 pg/mL — ABNORMAL LOW (ref 180–914)

## 2024-04-02 MED ORDER — FERROUS SULFATE 325 (65 FE) MG PO TBEC: 325.0000 mg | DELAYED_RELEASE_TABLET | Freq: Every day | ORAL | 3 refills | Status: AC

## 2024-04-02 MED ORDER — FOLIC ACID 1 MG PO TABS: 1.0000 mg | ORAL_TABLET | Freq: Every day | ORAL | 1 refills | Status: AC

## 2024-04-02 MED ORDER — VITAMIN B-12 1000 MCG PO TABS: 1000.0000 ug | ORAL_TABLET | Freq: Every day | ORAL | 1 refills | Status: AC

## 2024-04-02 NOTE — Assessment & Plan Note (Addendum)
 Chronic anemia with macrocytosis.  Hemoglobin improved from 10.9 in November to 12. MCV was elevated at 110, now 103.6.  Possible causes include vitamin B12 or folic acid  deficiency, or malabsorption. Occupational chemical exposure considered.  On his initial consult with us  on 01/08/2024, labs showed hemoglobin of 11.9, MCV 103.6.  White count 3500 with normal differential.  Platelet count normal at 247,000.  CMP was grossly unremarkable except for mildly elevated AST of 58.  Iron studies showed evidence of iron deficiency with iron decreased at 30, iron saturation decreased at 8%.  Ferritin was 67.  Folic acid  level was decreased at 4.4 ng/mL.  Vitamin B12 level was also decreased at 155 pg/mL.  Methylmalonic acid level was within normal limits.  LDH, haptoglobin, INR, PTT, fibrinogen , reticulocyte count were all within normal limits.  Coombs test negative. Antiparietal cell antibodies and intrinsic factor antibodies were negative, ruling out pernicious anemia.   He was started on vitamin B12, folic acid  and iron supplements orally.  The patient started taking folate and iron supplements from February 2025 but his B12 was not filled by his pharmacy for unknown reason.  Hemoglobin was better at 12.3 as of 04/02/2024.  MCV slightly better at 102.8.  White count normal at 4300.  I sent another prescription for B12 and also refilled folic acid  and iron supplements for him today.  He was advised to take over-the-counter B12 if prescription issues persist.  - Plan follow-up visit in 4 months to repeat blood work

## 2024-04-02 NOTE — Assessment & Plan Note (Signed)
 PT and PTT, fibrinogen were all within normal limits on 01/08/2024.  Will consider additional testing if symptoms were to persist.  Clinical suspicion low for platelet disorders or von Willebrand disease.

## 2024-04-02 NOTE — Progress Notes (Signed)
 [facility]  HEMATOLOGY CLINIC PROGRESS NOTE  PATIENT NAME: Jacob Davila   MR#: [id number] DOB: 1976-05-09  Patient Care Team: de [place], Alonza Jansky, MD as PCP - General (Family Medicine) Arleen Lacer, MD as PCP - Cardiology (Cardiology)  Date of visit: 04/02/2024   ASSESSMENT & PLAN:   Jacob Davila is a 48 y.o. gentleman with a past medical history of tachycardia, GERD, was referred to our service in February 2025 for evaluation of macrocytic anemia.     Macrocytic anemia Chronic anemia with macrocytosis.  Hemoglobin improved from 10.9 in November to 12. MCV was elevated at 110, now 103.6.  Possible causes include vitamin B12 or folic acid  deficiency, or malabsorption. Occupational chemical exposure considered.  On his initial consult with us  on 01/08/2024, labs showed hemoglobin of 11.9, MCV 103.6.  White count 3500 with normal differential.  Platelet count normal at 247,000.  CMP was grossly unremarkable except for mildly elevated AST of 58.  Iron studies showed evidence of iron deficiency with iron decreased at 30, iron saturation decreased at 8%.  Ferritin was 67.  Folic acid  level was decreased at 4.4 ng/mL.  Vitamin B12 level was also decreased at 155 pg/mL.  Methylmalonic acid level was within normal limits.  LDH, haptoglobin, INR, PTT, fibrinogen , reticulocyte count were all within normal limits.  Coombs test negative. Antiparietal cell antibodies and intrinsic factor antibodies were negative, ruling out pernicious anemia.   He was started on vitamin B12, folic acid  and iron supplements orally.  The patient started taking folate and iron supplements from February 2025 but his B12 was not filled by his pharmacy for unknown reason.  Hemoglobin was better at 12.3 as of 04/02/2024.  MCV slightly better at 102.8.  White count normal at 4300.  I sent another prescription for B12 and also refilled folic acid  and iron supplements for him today.  He was advised to take  over-the-counter B12 if prescription issues persist.  - Plan follow-up visit in 4 months to repeat blood work  Easy bruisability PT and PTT, fibrinogen  were all within normal limits on 01/08/2024.  Will consider additional testing if symptoms were to persist.  Clinical suspicion low for platelet disorders or von Willebrand disease.    I spent a total of 20 minutes during this encounter with the patient including review of chart and various tests results, discussions about plan of care and coordination of care plan.  I reviewed lab results and outside records for this visit and discussed relevant results with the patient. Diagnosis, plan of care and treatment options were also discussed in detail with the patient. Opportunity provided to ask questions and answers provided to his apparent satisfaction. Provided instructions to call our clinic with any problems, questions or concerns prior to return visit. I recommended to continue follow-up with PCP and sub-specialists. He verbalized understanding and agreed with the plan. No barriers to learning was detected.  Arlo Berber, MD  04/02/2024 4:11 PM  [facility] - A DEPT OF [facility] [id number] [place] [address] Dept: [phone] Dept Fax: [phone]   CHIEF COMPLAINT/ REASON FOR VISIT:  Follow-up for macrocytic anemia.  Workup in February 2025 revealed B12, folate and iron deficiency.  INTERVAL HISTORY:  Discussed the use of AI scribe software for clinical note transcription with the patient, who gave verbal consent to proceed.  History of Present Illness Jacob Davila is a 48  year old male with anemia who presents for follow-up of his blood work and vitamin deficiencies.  He has a history of anemia with previously identified vitamin B12 and folic acid  deficiencies. He has been experiencing difficulties obtaining his B12 prescription from the pharmacy,  despite multiple attempts. He has been taking iron and folic acid  supplements, which were successfully filled, but not the B12.  He feels tired easily, which he attributes to the low B12 levels. He experienced easy bruising previously but does not have any bruises currently. He is tolerating the iron supplements well without any constipation or other side effects.  Recent blood work shows that his hemoglobin levels have increased from 11.9 to 12.3. His white blood cell count was previously low and is now 4,300. The size of his red blood cells has decreased. He is currently awaiting results for his iron, B12, and other levels from today's tests.  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    SUMMARY OF HEMATOLOGIC HISTORY:  On 10/17/2023, labs at his PCPs office showed hemoglobin of 10.9, hematocrit 32.6, MCV 107.  White count 4900 with normal differential.  Platelet count normal at 207,000.  This prompted referral to hematology service for further evaluation of macrocytic anemia.   He had repeat labs on 12/22/2023.  Hemoglobin was 12, hematocrit 35.7, MCV 105.5.  White count and platelet count remained normal.  Iron studies showed iron saturation of 53%, slightly elevated, otherwise unremarkable.  Previously his AST and ALT were elevated.  Repeat hepatic function panel on 12/22/2023 showed AST of 72 units/L, otherwise unremarkable LFTs.   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  He reports fatigue and easy bruising. The patient reports always feeling tired and has a history of tachycardia, for which he takes a beta blocker. The patient also reports trouble sleeping and has a history of scoliosis which causes pain, particularly in cold weather. The patient smokes cigars and consumes alcohol very infrequently.   He denies recent chest pain on exertion, shortness  of breath on minimal exertion, pre-syncopal episodes, or palpitations.   He had not noticed any recent bleeding such as epistaxis, hematuria or hematochezia.   Chronic anemia with macrocytosis.   Hemoglobin improved from 10.9 in November to 12. MCV was elevated at 110, now 103.6.   Possible causes include vitamin B12 or folic acid  deficiency, or malabsorption. Occupational chemical exposure considered.   On his initial consult with us  on 01/08/2024, labs showed hemoglobin of 11.9, MCV 103.6.  White count 3500 with normal differential.  Platelet count normal at 247,000.  CMP was grossly unremarkable except for mildly elevated AST of 58.  Iron studies showed evidence of iron deficiency with iron decreased at 30, iron saturation decreased at 8%.  Ferritin was 67.  Folic acid  level was decreased at 4.4 ng/mL.  Vitamin B12 level was also decreased at 155 pg/mL.  Methylmalonic acid level was within normal limits.  LDH, haptoglobin, INR, PTT, fibrinogen , reticulocyte count were all within normal limits.  Coombs test negative.  Antiparietal cell antibodies and intrinsic factor antibodies were negative, ruling out pernicious anemia.   He was started on vitamin B12, folic acid  and iron supplements orally.   The patient started taking folate and iron supplements from February 2025 but his B12 was not filled by his pharmacy for unknown reason.  Hemoglobin was better at 12.3  as of 04/02/2024.  MCV slightly better at 102.8.  White count normal at 4300.  I have reviewed the past medical history, past surgical history, social history and family history with the patient and they are unchanged from previous note.  ALLERGIES: He is allergic to allegra [fexofenadine], darvon [propoxyphene], tessalon [benzonatate], latex, and morphine and codeine.  MEDICATIONS:  Current Outpatient Medications  Medication Sig Dispense Refill   cyclobenzaprine  (FLEXERIL ) 10 MG tablet Take 1 tablet (10 mg total) by mouth 2 (two) times  daily as needed for muscle spasms. 20 tablet 0   furosemide  (LASIX ) 20 MG tablet Take 20 mg daily as needed for weight gain of 3 lb overnight or 5 lb in 1 week. 90 tablet 3   lidocaine  (LIDODERM ) 5 % Place 1 patch onto the skin daily. Remove & Discard patch within 12 hours or as directed by MD 30 patch 0   metoprolol  succinate (TOPROL  XL) 25 MG 24 hr tablet Take 1.5 tablets (37.5 mg total) by mouth at bedtime. 135 tablet 3   omeprazole  (PRILOSEC OTC) 20 MG tablet Take 40 mg by mouth in the morning and at bedtime.     traMADol  (ULTRAM ) 50 MG tablet Take 1 tablet (50 mg total) by mouth every 6 (six) hours as needed for severe pain (pain score 7-10). 10 tablet 0   cyanocobalamin  (VITAMIN B12) 1000 MCG tablet Take 1 tablet (1,000 mcg total) by mouth daily. 90 tablet 1   ferrous sulfate  325 (65 FE) MG EC tablet Take 1 tablet (325 mg total) by mouth daily with breakfast. 30 tablet 3   folic acid  (FOLVITE ) 1 MG tablet Take 1 tablet (1 mg total) by mouth daily. 90 tablet 1   naproxen  (NAPROSYN ) 500 MG tablet Take 1 tablet (500 mg total) by mouth 2 (two) times daily. (Patient not taking: Reported on 04/02/2024) 30 tablet 0   No current facility-administered medications for this visit.     REVIEW OF SYSTEMS:    Review of Systems - Oncology  All other pertinent systems were reviewed with the patient and are negative.  PHYSICAL EXAMINATION:   Onc Performance Status - 04/02/24 1433       ECOG Perf Status   ECOG Perf Status Fully active, able to carry on all pre-disease performance without restriction      KPS SCALE   KPS % SCORE Normal, no compliants, no evidence of disease             Vitals:   04/02/24 1427  BP: 107/73  Pulse: 75  Resp: 18  Temp: 98.3 F (36.8 C)  SpO2: 96%   Filed Weights   04/02/24 1427  Weight: 157 lb 3.2 oz (71.3 kg)    Physical Exam Constitutional:      General: He is not in acute distress.    Appearance: Normal appearance.  HENT:     Head:  Normocephalic and atraumatic.  Eyes:     General: No scleral icterus.    Conjunctiva/sclera: Conjunctivae normal.  Cardiovascular:     Rate and Rhythm: Normal rate and regular rhythm.  Pulmonary:     Effort: Pulmonary effort is normal.  Abdominal:     General: There is no distension.  Neurological:     General: No focal deficit present.     Mental Status: He is alert and oriented to person, place, and time.  Psychiatric:        Mood and Affect: Mood normal.        Behavior:  Behavior normal.        Thought Content: Thought content normal.     LABORATORY DATA:   I have reviewed the data as listed.  Results for orders placed or performed in visit on 04/02/24  Ferritin  Result Value Ref Range   Ferritin 70 24 - 336 ng/mL  CMP ([facility] only)  Result Value Ref Range   Sodium 137 135 - 145 mmol/L   Potassium 4.7 3.5 - 5.1 mmol/L   Chloride 101 98 - 111 mmol/L   CO2 26 22 - 32 mmol/L   Glucose, Bld 72 70 - 99 mg/dL   BUN 14 6 - 20 mg/dL   Creatinine 4.54 0.98 - 1.24 mg/dL   Calcium 9.4 8.9 - 11.9 mg/dL   Total Protein 6.6 6.5 - 8.1 g/dL   Albumin 3.8 3.5 - 5.0 g/dL   AST 41 15 - 41 U/L   ALT 30 0 - 44 U/L   Alkaline Phosphatase 91 38 - 126 U/L   Total Bilirubin 0.3 0.0 - 1.2 mg/dL   GFR, Estimated >14 >78 mL/min   Anion gap 10 5 - 15  CBC with Differential ([facility] Only)  Result Value Ref Range   WBC Count 4.3 4.0 - 10.5 K/uL   RBC 3.60 (L) 4.22 - 5.81 MIL/uL   Hemoglobin 12.3 (L) 13.0 - 17.0 g/dL   HCT 29.5 (L) 62.1 - 30.8 %   MCV 102.8 (H) 80.0 - 100.0 fL   MCH 34.2 (H) 26.0 - 34.0 pg   MCHC 33.2 30.0 - 36.0 g/dL   RDW 65.7 84.6 - 96.2 %   Platelet Count 305 150 - 400 K/uL   nRBC 0.0 0.0 - 0.2 %   Neutrophils Relative % 32 %   Neutro Abs 1.3 (L) 1.7 - 7.7 K/uL   Lymphocytes Relative 49 %   Lymphs Abs 2.1 0.7 - 4.0 K/uL   Monocytes Relative 13 %   Monocytes Absolute 0.5 0.1 - 1.0 K/uL   Eosinophils Relative 4 %   Eosinophils Absolute 0.2 0.0 - 0.5  K/uL   Basophils Relative 2 %   Basophils Absolute 0.1 0.0 - 0.1 K/uL   Immature Granulocytes 0 %   Abs Immature Granulocytes 0.01 0.00 - 0.07 K/uL    RADIOGRAPHIC STUDIES:  No recent pertinent imaging studies available to review.  Orders Placed This Encounter  Procedures   Iron and TIBC   CBC with Differential ([facility] Only)    Standing Status:   Future    Expected Date:   08/03/2024    Expiration Date:   04/02/2025   Iron and TIBC    Standing Status:   Future    Expected Date:   08/03/2024    Expiration Date:   04/02/2025   Ferritin    Standing Status:   Future    Expected Date:   08/03/2024    Expiration Date:   04/02/2025   Vitamin B12    Standing Status:   Future    Expected Date:   08/03/2024    Expiration Date:   04/02/2025   Folate    Standing Status:   Future    Expected Date:   08/03/2024    Expiration Date:   04/02/2025     Future Appointments  Date Time Provider Department [facility]  05/22/2024  4:00 PM Arleen Lacer, MD [facility]  09/02/2024  9:10 AM de [place], Raymond J, MD [facility]  09/12/2024  4:10 PM de [place],  Alonza Jansky, MD [facility]     This document was completed utilizing speech recognition software. Grammatical errors, random word insertions, pronoun errors, and incomplete sentences are an occasional consequence of this system due to software limitations, ambient noise, and hardware issues. Any formal questions or concerns about the content, text or information contained within the body of this dictation should be directly addressed to the provider for clarification.

## 2024-04-03 ENCOUNTER — Ambulatory Visit: Payer: Self-pay | Admitting: Oncology

## 2024-04-03 ENCOUNTER — Telehealth: Payer: Self-pay | Admitting: Oncology

## 2024-04-03 NOTE — Telephone Encounter (Signed)
 Patient has been scheduled for follow-up visit per 03/28/24 LOS.  LVM notifying pt of appt details, provided my direct number to pt if appt changes need to be made.

## 2024-04-18 ENCOUNTER — Telehealth (HOSPITAL_BASED_OUTPATIENT_CLINIC_OR_DEPARTMENT_OTHER): Payer: Self-pay | Admitting: Family Medicine

## 2024-04-18 NOTE — Telephone Encounter (Signed)
 Patient came by and is having severe pain in his back and running down his leg. He stated he went to the ED and they prescribed meds he would like for them to be refilled and would like some information about other resources for back pain.  The medication he would like refilled is cyclobenzaprine  10 mg.  Please advise

## 2024-04-22 ENCOUNTER — Encounter (HOSPITAL_BASED_OUTPATIENT_CLINIC_OR_DEPARTMENT_OTHER): Payer: Self-pay | Admitting: Family Medicine

## 2024-04-22 ENCOUNTER — Ambulatory Visit (INDEPENDENT_AMBULATORY_CARE_PROVIDER_SITE_OTHER): Admitting: Family Medicine

## 2024-04-22 VITALS — BP 132/87 | HR 102 | Ht 71.0 in | Wt 154.7 lb

## 2024-04-22 DIAGNOSIS — M5431 Sciatica, right side: Secondary | ICD-10-CM

## 2024-04-22 MED ORDER — TRIAMCINOLONE ACETONIDE 40 MG/ML IJ SUSP
40.0000 mg | Freq: Once | INTRAMUSCULAR | Status: AC
Start: 2024-04-22 — End: 2024-04-22
  Administered 2024-04-22: 40 mg via INTRAMUSCULAR

## 2024-04-22 MED ORDER — CYCLOBENZAPRINE HCL 10 MG PO TABS: 10.0000 mg | ORAL_TABLET | Freq: Two times a day (BID) | ORAL | 0 refills | Status: DC | PRN

## 2024-04-22 NOTE — Telephone Encounter (Signed)
 Patient has appt 6/2 with Trula Gable for issue

## 2024-04-22 NOTE — Progress Notes (Signed)
 Acute Care Office Visit  Subjective:   Jacob Davila Nov 27, 1975 04/22/2024  Chief Complaint  Patient presents with   Back Pain    Patient states he has been having pain in his back for about 1 month. Did go to the ED to be evaluated. States the pain in his back will go down leg. Pain did get some better after meds prescribed by ED but states for the past 2-3 days, the pain has been worse.    HPI: BACK PAIN:   Jacob Davila presents with complaint of back pain onset 2-3wks ago while lifting heavy boxes. Denies fall or trauma. Hx of scoliosis. Pain is described as aching, constant, radiating down RLE described as tingling. Pt was seen in the ED 03/22/24, received Toradol , Flexeril , and lidocaine  patches and initially felt better and was dc'ed home. No imaging at that time. Since then, pt has been taking Flexeril  and using the lidocaine  patches with relief, but is now out of both, and states pain has returned. Pain worsens with movement. Denies bowel or bladder control, saddle numbness in groin, numbness, urinary sx.    ASSOCIATED SYMPTOMS:  Chills: no Dysuria: no Abdominal Pain: no Nausea: no Vomiting: no Hematuria: no Dizziness: no Bowel or Bladder Dysfunction: no Numbness: no Weakness: no    The following portions of the patient's history were reviewed and updated as appropriate: past medical history, past surgical history, family history, social history, allergies, medications, and problem list.   Patient Active Problem List   Diagnosis Date Noted   Folic acid  deficiency 01/22/2024   Anemia due to vitamin B12 deficiency 01/22/2024   Iron deficiency anemia 01/22/2024   Easy bruisability 01/08/2024   Wellness examination 09/13/2023   Insomnia 09/13/2023   URI (upper respiratory infection) 10/26/2022   Seizure-like activity (HCC) 09/08/2022   Decreased strength of lower extremity 06/02/2022   Dysphagia 09/15/2021   Hepatic steatosis 07/08/2021   Weakness of left  lower extremity 07/08/2021   Transaminitis 05/27/2021   Macrocytic anemia 05/27/2021   Elevated alkaline phosphatase level 05/27/2021   Varicose veins of leg with swelling, bilateral 05/17/2021   Paroxysmal tachycardia (HCC) 05/17/2021   Sinus tachycardia 05/17/2021   Dizziness 05/17/2021   Past Medical History:  Diagnosis Date   Allergy    Scoliosis    - s/p correctvie Sgx   Tachycardia    Venous insufficiency of both lower extremities 05/2021   Lower extremity venous Doppler 05/29/2021: No evidence of DVT.  Venous reflux noted in the right GSV in the calf.  Venous reflux noted in the left common femoral vein and left greater saphenous vein in the thigh.  Left GSV in the calf as well as a left perforator vein. => Forwarded to vascular surgery:   Past Surgical History:  Procedure Laterality Date   BACK SURGERY  03/1990   Scoliosis   COLONOSCOPY     ENDOVENOUS ABLATION SAPHENOUS VEIN W/ LASER Left 04/20/2022   endovenous laser ablation left greater saphenous vein and stab phlebectomy 10-20 incisions left leg by Kirtland Perfect MD   TRANSTHORACIC ECHOCARDIOGRAM  06/08/2019   EF 60-65%.  Normal wall motion.  Normal relaxation.  Mild aortic valve thickening but no sclerosis/stenosis.  Normal echo   UPPER GASTROINTESTINAL ENDOSCOPY  02/21/2022   Rosaline Coma   Family History  Problem Relation Age of Onset   Atrial fibrillation Mother    Heart failure Mother    Hypertension Father    Hyperlipidemia Father  Colon cancer Maternal Aunt    Colon cancer Paternal Uncle    CAD Paternal Grandfather        noted to have Angina (not aware of details)   Stomach cancer Neg Hx    Esophageal cancer Neg Hx    Rectal cancer Neg Hx    Pancreatic cancer Neg Hx    Outpatient Medications Prior to Visit  Medication Sig Dispense Refill   cyanocobalamin  (VITAMIN B12) 1000 MCG tablet Take 1 tablet (1,000 mcg total) by mouth daily. 90 tablet 1   ferrous sulfate  325 (65 FE) MG EC tablet Take 1 tablet (325 mg  total) by mouth daily with breakfast. 30 tablet 3   folic acid  (FOLVITE ) 1 MG tablet Take 1 tablet (1 mg total) by mouth daily. 90 tablet 1   furosemide  (LASIX ) 20 MG tablet Take 20 mg daily as needed for weight gain of 3 lb overnight or 5 lb in 1 week. 90 tablet 3   lidocaine  (LIDODERM ) 5 % Place 1 patch onto the skin daily. Remove & Discard patch within 12 hours or as directed by MD 30 patch 0   metoprolol  succinate (TOPROL  XL) 25 MG 24 hr tablet Take 1.5 tablets (37.5 mg total) by mouth at bedtime. 135 tablet 3   naproxen  (NAPROSYN ) 500 MG tablet Take 1 tablet (500 mg total) by mouth 2 (two) times daily. 30 tablet 0   omeprazole  (PRILOSEC OTC) 20 MG tablet Take 40 mg by mouth in the morning and at bedtime.     traMADol  (ULTRAM ) 50 MG tablet Take 1 tablet (50 mg total) by mouth every 6 (six) hours as needed for severe pain (pain score 7-10). (Patient not taking: Reported on 04/22/2024) 10 tablet 0   cyclobenzaprine  (FLEXERIL ) 10 MG tablet Take 1 tablet (10 mg total) by mouth 2 (two) times daily as needed for muscle spasms. 20 tablet 0   No facility-administered medications prior to visit.   Allergies  Allergen Reactions   Allegra [Fexofenadine] Other (See Comments)    Blood rushes out of head and faints   Darvon [Propoxyphene] Itching    Made skin feel like it was crawling    Tessalon [Benzonatate] Itching    Made skin feel like it was crawling    Latex Dermatitis and Rash   Morphine And Codeine Rash     ROS: A complete ROS was performed with pertinent positives/negatives noted in the HPI. The remainder of the ROS are negative.    Objective:   Today's Vitals   04/22/24 1033  BP: 132/87  Pulse: (!) 102  SpO2: 97%  Weight: 154 lb 11.2 oz (70.2 kg)  Height: 5\' 11"  (1.803 m)    GENERAL: Well-appearing, in NAD. Well nourished.  SKIN: Pink, warm and dry.  RESPIRATORY: Chest wall symmetrical. Respirations even and non-labored.  MSK: Muscle tone and strength appropriate for age.  Joints w/o tenderness, redness, or swelling. Equal leg strength bilaterally. Tenderness with palpation of bilateral lumbar paraspinous muscles. No step off, deformity, or crepitus to spine. Scoliosis present. + straight leg raise testing right side. + pain to lumbar spine and RLE with FABER, FABIR testing.  EXTREMITIES: Without clubbing, cyanosis, or edema.  NEUROLOGIC: No motor or sensory deficits. Steady, even gait. C2-C12 intact.  PSYCH/MENTAL STATUS: Alert, oriented x 3. Cooperative, appropriate mood and affect.    No results found for any visits on 04/22/24.    Assessment & Plan:  1. Sciatica, right side (Primary) Likely lumbar strain with right sided sciatica  involvement. Scoliosis may be contributing. Will given Kenalog 40mg  IM in office and refill flexeril . Patient would likely benefit from formal PT and referral placed. Continue ice, heat, rest, stretching and follow up if no improvement in 2-4 weeks.   - cyclobenzaprine  (FLEXERIL ) 10 MG tablet; Take 1 tablet (10 mg total) by mouth 2 (two) times daily as needed for muscle spasms.  Dispense: 30 tablet; Refill: 0 - Ambulatory referral to Physical Therapy - triamcinolone acetonide (KENALOG-40) injection 40 mg   Meds ordered this encounter  Medications   cyclobenzaprine  (FLEXERIL ) 10 MG tablet    Sig: Take 1 tablet (10 mg total) by mouth 2 (two) times daily as needed for muscle spasms.    Dispense:  30 tablet    Refill:  0    Supervising Provider:   DE Peru, RAYMOND J [[id number]]   triamcinolone acetonide (KENALOG-40) injection 40 mg   Lab Orders  No laboratory test(s) ordered today    Return if symptoms worsen or fail to improve.    Patient to reach out to office if new, worrisome, or unresolved symptoms arise or if no improvement in patient's condition. Patient verbalized understanding and is agreeable to treatment plan. All questions answered to patient's satisfaction.    Nonda Bays, [place]

## 2024-05-07 NOTE — Therapy (Incomplete)
 OUTPATIENT PHYSICAL THERAPY THORACOLUMBAR EVALUATION   Patient Name: Jacob Davila MRN: [id number] DOB:June 11, 1976, 48 y.o., male Today's Date: 05/07/2024  END OF SESSION:   Past Medical History:  Diagnosis Date   Allergy    Scoliosis    - s/p correctvie Sgx   Tachycardia    Venous insufficiency of both lower extremities 05/2021   Lower extremity venous Doppler 05/29/2021: No evidence of DVT.  Venous reflux noted in the right GSV in the calf.  Venous reflux noted in the left common femoral vein and left greater saphenous vein in the thigh.  Left GSV in the calf as well as a left perforator vein. => Forwarded to vascular surgery:   Past Surgical History:  Procedure Laterality Date   BACK SURGERY  03/1990   Scoliosis   COLONOSCOPY     ENDOVENOUS ABLATION SAPHENOUS VEIN W/ LASER Left 04/20/2022   endovenous laser ablation left greater saphenous vein and stab phlebectomy 10-20 incisions left leg by Kirtland Perfect MD   TRANSTHORACIC ECHOCARDIOGRAM  06/08/2019   EF 60-65%.  Normal wall motion.  Normal relaxation.  Mild aortic valve thickening but no sclerosis/stenosis.  Normal echo   UPPER GASTROINTESTINAL ENDOSCOPY  02/21/2022   [facility]   Patient Active Problem List   Diagnosis Date Noted   Folic acid  deficiency 01/22/2024   Anemia due to vitamin B12 deficiency 01/22/2024   Iron deficiency anemia 01/22/2024   Easy bruisability 01/08/2024   Wellness examination 09/13/2023   Insomnia 09/13/2023   URI (upper respiratory infection) 10/26/2022   Seizure-like activity (HCC) 09/08/2022   Decreased strength of lower extremity 06/02/2022   Dysphagia 09/15/2021   Hepatic steatosis 07/08/2021   Weakness of left lower extremity 07/08/2021   Transaminitis 05/27/2021   Macrocytic anemia 05/27/2021   Elevated alkaline phosphatase level 05/27/2021   Varicose veins of leg with swelling, bilateral 05/17/2021   Paroxysmal tachycardia (HCC) 05/17/2021   Sinus tachycardia 05/17/2021   Dizziness  05/17/2021    PCP: de Peru, Raymond J, MD   REFERRING PROVIDER: Nonda Bays, FNP   REFERRING DIAG: M54.31 (ICD-10-CM) - Sciatica, right side   Rationale for Evaluation and Treatment: {HABREHAB:27488}  THERAPY DIAG:  No diagnosis found.  ONSET DATE: ***  SUBJECTIVE:                                                                                                                                                                                           SUBJECTIVE STATEMENT: ***  PERTINENT HISTORY:  ***  PAIN:  Are you having pain? Yes: NPRS scale: *** Pain location: *** Pain description: *** Aggravating factors: *** Relieving factors: ***  PRECAUTIONS: {Therapy precautions:24002}  RED FLAGS: {PT Red Flags:29287}   WEIGHT BEARING RESTRICTIONS: {Yes ***/No:24003}  FALLS:  Has patient fallen in last 6 months? {fallsyesno:27318}  LIVING ENVIRONMENT: Lives with: {[facility] lives with:25569::lives with their family} Lives in: {Lives in:25570} Stairs: {opstairs:27293} Has following equipment at home: {Assistive devices:23999}  OCCUPATION: ***  PLOF: {PLOF:24004}  PATIENT GOALS: ***  NEXT MD VISIT: ***  OBJECTIVE:  Note: Objective measures were completed at Evaluation unless otherwise noted.  DIAGNOSTIC FINDINGS:  NA for lumbar spine in Epic  PATIENT SURVEYS:  {rehab surveys:24030}  COGNITION: Overall cognitive status: {cognition:24006}     SENSATION: {sensation:27233}  MUSCLE LENGTH: Hamstrings: Right *** deg; Left *** deg Andy Bannister test: Right *** deg; Left *** deg  POSTURE: {posture:25561}  PALPATION: ***  LUMBAR ROM:   AROM eval  Flexion   Extension   Right lateral flexion   Left lateral flexion   Right rotation   Left rotation    (Blank rows = not tested)  LOWER EXTREMITY ROM:     {AROM/PROM:27142}  Right eval Left eval  Hip flexion    Hip extension    Hip abduction    Hip adduction    Hip internal rotation    Hip  external rotation    Knee flexion    Knee extension    Ankle dorsiflexion    Ankle plantarflexion    Ankle inversion    Ankle eversion     (Blank rows = not tested)  LOWER EXTREMITY MMT:    MMT Right eval Left eval  Hip flexion    Hip extension    Hip abduction    Hip adduction    Hip internal rotation    Hip external rotation    Knee flexion    Knee extension    Ankle dorsiflexion    Ankle plantarflexion    Ankle inversion    Ankle eversion     (Blank rows = not tested)  LUMBAR SPECIAL TESTS:  {lumbar special test:25242}  FUNCTIONAL TESTS:  {Functional tests:24029}  GAIT: Distance walked: *** Assistive device utilized: {Assistive devices:23999} Level of assistance: {Levels of assistance:24026} Comments: ***  TREATMENT DATE: ***                                                                                                                                 PATIENT EDUCATION:  Education details: *** Person educated: {Person educated:25204} Education method: {Education Method:25205} Education comprehension: {Education Comprehension:25206}  HOME EXERCISE PROGRAM: ***  ASSESSMENT:  CLINICAL IMPRESSION: Patient is a 48 y.o. male who was seen today for physical therapy evaluation and treatment for ***.   OBJECTIVE IMPAIRMENTS: {opptimpairments:25111}.   ACTIVITY LIMITATIONS: {activitylimitations:27494}  PARTICIPATION LIMITATIONS: {participationrestrictions:25113}  PERSONAL FACTORS: {Personal factors:25162} are also affecting patient's functional outcome.   REHAB POTENTIAL: {rehabpotential:25112}  CLINICAL DECISION MAKING: {clinical decision making:25114}  EVALUATION COMPLEXITY: {Evaluation complexity:25115}   GOALS:  SHORT TERM GOALS: Target date: ***  *** Baseline: Goal  status: INITIAL  2.  *** Baseline:  Goal status: INITIAL  3.  *** Baseline:  Goal status: INITIAL  4.  *** Baseline:  Goal status: INITIAL  5.  *** Baseline:   Goal status: INITIAL  6.  *** Baseline:  Goal status: INITIAL  LONG TERM GOALS: Target date: ***  *** Baseline:  Goal status: INITIAL  2.  *** Baseline:  Goal status: INITIAL  3.  *** Baseline:  Goal status: INITIAL  4.  *** Baseline:  Goal status: INITIAL  5.  *** Baseline:  Goal status: INITIAL  6.  *** Baseline:  Goal status: INITIAL  PLAN:  PT FREQUENCY: {rehab frequency:25116}  PT DURATION: {rehab duration:25117}  PLANNED INTERVENTIONS: {rehab planned interventions:25118::97110-Therapeutic exercises,97530- Therapeutic [phone]- Neuromuscular re-education,97535- Self HYQM,57846- Manual therapy}.  PLAN FOR NEXT SESSION: ***   Liborio Reeds, PT 05/07/2024, 6:20 PM

## 2024-05-08 ENCOUNTER — Ambulatory Visit

## 2024-05-12 ENCOUNTER — Other Ambulatory Visit (HOSPITAL_BASED_OUTPATIENT_CLINIC_OR_DEPARTMENT_OTHER): Payer: Self-pay | Admitting: Family Medicine

## 2024-05-12 DIAGNOSIS — M5431 Sciatica, right side: Secondary | ICD-10-CM

## 2024-05-22 ENCOUNTER — Ambulatory Visit: Admitting: Cardiology

## 2024-06-04 ENCOUNTER — Ambulatory Visit (HOSPITAL_COMMUNITY)
Admission: EM | Admit: 2024-06-04 | Discharge: 2024-06-04 | Disposition: A | Discharge status: Home / Self Care | Attending: Physician Assistant | Admitting: Physician Assistant

## 2024-06-04 ENCOUNTER — Encounter (HOSPITAL_COMMUNITY): Payer: Self-pay | Admitting: Emergency Medicine

## 2024-06-04 ENCOUNTER — Ambulatory Visit: Payer: Self-pay

## 2024-06-04 DIAGNOSIS — K529 Noninfective gastroenteritis and colitis, unspecified: Secondary | ICD-10-CM

## 2024-06-04 MED ORDER — ONDANSETRON 4 MG PO TBDP: 4.0000 mg | ORAL_TABLET | Freq: Three times a day (TID) | ORAL | 0 refills | Status: DC | PRN

## 2024-06-04 NOTE — ED Triage Notes (Signed)
 Pt c/o abd pains, diarrhea, sweats, chills that started yesterday. Tried Pepto Bismol that didn't help.

## 2024-06-04 NOTE — Telephone Encounter (Signed)
 FYI Only or Action Required?: FYI only for provider.  Patient was last seen in primary care on 04/22/2024 by Knute Thersia Bitters, FNP.  Called Nurse Triage reporting GI Problem.  Symptoms began yesterday.  Interventions attempted: Nothing.  Symptoms are: unchanged.  Triage Disposition: See Physician Within 24 Hours  Patient/caregiver understands and will follow disposition?: Unsure  Copied from CRM [id number]. Topic: Clinical - Red Word Triage >> Jun 04, 2024  7:58 AM Donna BRAVO wrote: Red Word that prompted transfer to Nurse Triage: patient called, starting Monday morning, diarrhea and vomiting, continued today, can only keep down sips of water, laying in bed trying not move, Reason for Disposition  Abdominal pain  (Exceptions: Pain clears with each passage of diarrhea stool, or symptoms similar to previously diagnosed irritable bowel syndrome.)  Answer Assessment - Initial Assessment Questions 1. DIARRHEA SEVERITY: How bad is the diarrhea? How many more stools have you had in the past 24 hours than normal?      5 2. ONSET: When did the diarrhea begin?      yesterday 3. STOOL DESCRIPTION:  How loose or watery is the diarrhea? What is the stool color? Is there any blood or mucous in the stool?     loose 4. VOMITING: Are you also vomiting? If Yes, ask: How many times in the past 24 hours?      3 5. ABDOMEN PAIN: Are you having any abdomen pain? If Yes, ask: What does it feel like? (e.g., crampy, dull, intermittent, constant)      aches 6. ABDOMEN PAIN SEVERITY: If present, ask: How bad is the pain?  (e.g., Scale 1-10; mild, moderate, or severe)     5 7. ORAL INTAKE: If vomiting, Have you been able to drink liquids? How much liquids have you had in the past 24 hours?     Sipping water 8. HYDRATION: Any signs of dehydration? (e.g., dry mouth [not just dry lips], too weak to stand, dizziness, new weight loss) When did you last urinate?     Void was about 1.5  hours ago,  9. EXPOSURE: Have you traveled to a foreign country recently? Have you been exposed to anyone with diarrhea? Could you have eaten any food that was spoiled?     denies 10. ANTIBIOTIC USE: Are you taking antibiotics now or have you taken antibiotics in the past 2 months?       denies 11. OTHER SYMPTOMS: Do you have any other symptoms? (e.g., fever, blood in stool)   Fever-probably, no thermometer  Pt advised to utilize UC as no appts in clinic until 7/17. Pt agreeable. Pt asking about work note.  Protocols used: [facility]

## 2024-06-04 NOTE — Discharge Instructions (Addendum)
  Please report to ED with any worsening or persistent symptoms

## 2024-06-06 NOTE — ED Provider Notes (Signed)
 [facility]    CSN: [id number] Arrival date & time: 06/04/24  0900      History   Chief Complaint Chief Complaint  Patient presents with   Diarrhea   Abdominal Pain    HPI Jacob Davila is a 48 y.o. male.   Patient in today for evaluation of abdominal pain, diarrhea, sweats and chills that started yesterday.  He has tried Pepto-Bismol without relief.  He does not report any vomiting but has had nausea.  He denies recent travel.  The history is provided by the patient.  Diarrhea Associated symptoms: abdominal pain   Associated symptoms: no chills, no fever and no vomiting   Abdominal Pain Associated symptoms: diarrhea and nausea   Associated symptoms: no chills, no fever, no shortness of breath and no vomiting     Past Medical History:  Diagnosis Date   Allergy    Scoliosis    - s/p correctvie Sgx   Tachycardia    Venous insufficiency of both lower extremities 05/2021   Lower extremity venous Doppler 05/29/2021: No evidence of DVT.  Venous reflux noted in the right GSV in the calf.  Venous reflux noted in the left common femoral vein and left greater saphenous vein in the thigh.  Left GSV in the calf as well as a left perforator vein. => Forwarded to vascular surgery:    Patient Active Problem List   Diagnosis Date Noted   Folic acid  deficiency 01/22/2024   Anemia due to vitamin B12 deficiency 01/22/2024   Iron deficiency anemia 01/22/2024   Easy bruisability 01/08/2024   Wellness examination 09/13/2023   Insomnia 09/13/2023   URI (upper respiratory infection) 10/26/2022   Seizure-like activity (HCC) 09/08/2022   Decreased strength of lower extremity 06/02/2022   Dysphagia 09/15/2021   Hepatic steatosis 07/08/2021   Weakness of left lower extremity 07/08/2021   Transaminitis 05/27/2021   Macrocytic anemia 05/27/2021   Elevated alkaline phosphatase level 05/27/2021   Varicose veins of leg with swelling, 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  Normal wall motion.  Normal relaxation.  Mild aortic valve thickening but no sclerosis/stenosis.  Normal echo   UPPER GASTROINTESTINAL ENDOSCOPY  02/21/2022   [place]       Home Medications    Prior to Admission medications   Medication Sig Start Date End Date Taking? Authorizing Provider  ondansetron  (ZOFRAN -ODT) 4 MG disintegrating tablet Take 1 tablet (4 mg total) by mouth every 8 (eight) hours as needed for nausea or vomiting. 06/04/24  Yes Billy Asberry FALCON, PA-C  cyanocobalamin  (VITAMIN B12) 1000 MCG tablet Take 1 tablet (1,000 mcg total) by mouth daily. 04/02/24   Pasam, Chinita, MD  cyclobenzaprine  (FLEXERIL ) 10 MG tablet TAKE 1 TABLET(10 MG) BY MOUTH TWICE DAILY AS NEEDED FOR MUSCLE SPASMS 05/14/24   de [place], Quintin PARAS, MD  ferrous sulfate  325 (65 FE) MG EC tablet Take 1 tablet (325 mg total) by mouth daily with breakfast. 04/02/24   Pasam, Avinash, MD  folic acid  (FOLVITE ) 1 MG tablet Take 1 tablet (1 mg total) by  mouth daily. 04/02/24   Pasam, Chinita, MD  furosemide  (LASIX ) 20 MG tablet Take 20 mg daily as needed for weight gain of 3 lb overnight or 5 lb in 1 week. 10/02/23   West, Katlyn D, NP  lidocaine  (LIDODERM ) 5 % Place 1 patch onto the skin daily. Remove & Discard patch within 12 hours or as directed by MD 03/22/24   Barrett, Jamie N, PA-C  metoprolol  succinate (TOPROL  XL) 25 MG 24 hr tablet Take 1.5 tablets (37.5 mg total) by mouth at bedtime. 10/02/23   West, Katlyn D, NP  naproxen  (NAPROSYN ) 500 MG tablet Take 1 tablet (500 mg total) by mouth 2 (two) times  daily. 03/22/24   Barrett, Warren SAILOR, PA-C  omeprazole  (PRILOSEC OTC) 20 MG tablet Take 40 mg by mouth in the morning and at bedtime.    [provider]  traMADol  (ULTRAM ) 50 MG tablet Take 1 tablet (50 mg total) by mouth every 6 (six) hours as needed for severe pain (pain score 7-10). Patient not taking: Reported on 04/22/2024 03/22/24   Barrett, Warren SAILOR, PA-C    Family History Family History  Problem Relation Age of Onset   Atrial fibrillation Mother    Heart failure Mother    Hypertension Father    Hyperlipidemia Father    Colon cancer Maternal Aunt    Colon cancer Paternal Uncle    CAD Paternal Grandfather        noted to have Angina (not aware of details)   Stomach cancer Neg Hx    Esophageal cancer Neg Hx    Rectal cancer Neg Hx    Pancreatic cancer Neg Hx     Social History Social History   Tobacco Use   Smoking status: Some Days    Types: Cigars   Smokeless tobacco: Never  Vaping Use   Vaping status: Never Used  Substance Use Topics   Alcohol use: Yes    Comment: once every few months   Drug use: Never     Allergies   Allegra [fexofenadine], Darvon [propoxyphene], Tessalon [benzonatate], Latex, and Morphine and codeine   Review of Systems Review of Systems  Constitutional:  Negative for chills and fever.  Eyes:  Negative for discharge and redness.  Respiratory:  Negative for shortness of breath.   Gastrointestinal:  Positive for abdominal pain, diarrhea and nausea. Negative for blood in stool and vomiting.  Neurological:  Negative for numbness.     Physical Exam Triage Vital Signs ED Triage Vitals  Encounter Vitals Group     BP 06/04/24 0944 124/86     Girls Systolic BP Percentile --      Girls Diastolic BP Percentile --      Boys Systolic BP Percentile --      Boys Diastolic BP Percentile --      Pulse Rate 06/04/24 0944 74     Resp 06/04/24 0944 17     Temp 06/04/24 0944 98 F (36.7 C)     Temp Source 06/04/24 0944 Oral     SpO2 06/04/24  0944 95 %     Weight --      Height --      Head Circumference --      Peak Flow --      Pain Score 06/04/24 0942 4     Pain Loc --      Pain Education --      Exclude from Growth Chart --    No data found.  Updated Vital  Signs BP 124/86 (BP Location: Right Arm)   Pulse 74   Temp 98 F (36.7 C) (Oral)   Resp 17   SpO2 95%   Visual Acuity Right Eye Distance:   Left Eye Distance:   Bilateral Distance:    Right Eye Near:   Left Eye Near:    Bilateral Near:     Physical Exam Vitals and nursing note reviewed.  Constitutional:      General: He is not in acute distress.    Appearance: Normal appearance. He is not ill-appearing.  HENT:     Head: Normocephalic and atraumatic.  Eyes:     Conjunctiva/sclera: Conjunctivae normal.  Cardiovascular:     Rate and Rhythm: Normal rate and regular rhythm.  Pulmonary:     Effort: Pulmonary effort is normal. No respiratory distress.     Breath sounds: Normal breath sounds. No wheezing, rhonchi or rales.  Abdominal:     General: Abdomen is flat. Bowel sounds are normal. There is no distension.     Palpations: Abdomen is soft.     Tenderness: There is no abdominal tenderness. There is no guarding or rebound.  Neurological:     Mental Status: He is alert.  Psychiatric:        Mood and Affect: Mood normal.        Behavior: Behavior normal.        Thought Content: Thought content normal.      UC Treatments / Results  Labs (all labs ordered are listed, but only abnormal results are displayed) Labs Reviewed - No data to display  EKG   Radiology No results found.  Procedures Procedures (including critical care time)  Medications Ordered in UC Medications - No data to display  Initial Impression / Assessment and Plan / UC Course  I have reviewed the triage vital signs and the nursing notes.  Pertinent labs & imaging results that were available during my care of the patient were reviewed by me and considered in my  medical decision making (see chart for details).    Suspect viral etiology of symptoms.  Will treat with Zofran  for nausea and advised increased fluids.  Encouraged follow-up in the emergency room if symptoms do not improve or worsen in any way.  Final Clinical Impressions(s) / UC Diagnoses   Final diagnoses:  Gastroenteritis     Discharge Instructions       Please report to ED with any worsening or persistent symptoms.       ED Prescriptions     Medication Sig Dispense Auth. Provider   ondansetron  (ZOFRAN -ODT) 4 MG disintegrating tablet Take 1 tablet (4 mg total) by mouth every 8 (eight) hours as needed for nausea or vomiting. 20 tablet Billy Asberry FALCON, PA-C      PDMP not reviewed this encounter.   Billy Asberry FALCON, PA-C 06/06/24 1752

## 2024-06-20 ENCOUNTER — Telehealth: Payer: Self-pay | Admitting: Cardiology

## 2024-06-20 NOTE — Telephone Encounter (Signed)
*  STAT* If patient is at the pharmacy, call can be transferred to refill team.   1. Which medications need to be refilled? (please list name of each medication and dose if known) metoprolol  succinate (TOPROL  XL) 25 MG 24 hr tablet    2. Would you like to learn more about the convenience, safety, & potential cost savings by using the [facility]? No   3. Are you open to using the [facility] (Type [facility].) No   4. Which pharmacy/location (including street and city if local pharmacy) is medication to be sent to? [id number] - [place], [place] - [address] AT [facility] OF [address]    5. Do they need a 30 day or 90 day supply? 30 day  Pt is out of medication and has appt on 8/25

## 2024-06-26 MED ORDER — METOPROLOL SUCCINATE ER 25 MG PO TB24: 35.5000 mg | ORAL_TABLET | Freq: Every day | ORAL | 0 refills | Status: DC

## 2024-06-26 NOTE — Telephone Encounter (Signed)
 30 day refill has been sent to [facility].

## 2024-07-15 ENCOUNTER — Ambulatory Visit: Admitting: Cardiology

## 2024-07-18 ENCOUNTER — Ambulatory Visit: Payer: Self-pay

## 2024-07-18 NOTE — Telephone Encounter (Signed)
 FYI Only or Action Required?: FYI only for provider.  Patient was last seen in primary care on 04/22/2024 by Knute Thersia Bitters, FNP.  Called Nurse Triage reporting Dizziness.  Symptoms began today.  Interventions attempted: Rest, hydration, or home remedies.  Symptoms are: unchanged.  Triage Disposition: Go to ED Now (Notify PCP)  Patient/caregiver understands and will follow disposition?: Unsure  Copied from CRM [id number]. Topic: Clinical - Red Word Triage >> Jul 18, 2024  3:59 PM Charlet HERO wrote: Red Word that prompted transfer to Nurse Triage: Patient is calling bc when he tried to get up this morning he was very dizzy so he laid back down and has been down all day he has been dizzy.. Reason for Disposition  Loss of vision or double vision  (Exception: Similar to previous migraines.)  Answer Assessment - Initial Assessment Questions 1. DESCRIPTION: Describe your dizziness.     Feels like gravity, pulling me down 2. VERTIGO: Do you feel like either you or the room is spinning or tilting?      Spinning this morning 3. LIGHTHEADED: Do you feel lightheaded? (e.g., somewhat faint, woozy, weak upon standing)     Feels lightheaded 9. OTHER SYMPTOMS: Do you have any other symptoms? (e.g., earache, headache, numbness, tinnitus, vomiting, weakness)     Double vision, states never had double vision before  Pt advised to go to the ED.  Protocols used: Dizziness - Vertigo-A-AH

## 2024-07-25 NOTE — Telephone Encounter (Signed)
 LVM for patient to call the office

## 2024-08-06 ENCOUNTER — Inpatient Hospital Stay: Attending: Oncology

## 2024-08-06 ENCOUNTER — Inpatient Hospital Stay (HOSPITAL_BASED_OUTPATIENT_CLINIC_OR_DEPARTMENT_OTHER): Admitting: Oncology

## 2024-08-06 ENCOUNTER — Encounter: Payer: Self-pay | Admitting: Oncology

## 2024-08-06 ENCOUNTER — Ambulatory Visit (HOSPITAL_BASED_OUTPATIENT_CLINIC_OR_DEPARTMENT_OTHER)
Admission: RE | Admit: 2024-08-06 | Discharge: 2024-08-06 | Disposition: A | Discharge status: Home / Self Care | Source: Ambulatory Visit | Attending: Oncology | Admitting: Oncology

## 2024-08-06 VITALS — BP 106/65 | HR 69 | Temp 97.4°F | Resp 16 | Wt 154.2 lb

## 2024-08-06 DIAGNOSIS — M25441 Effusion, right hand: Secondary | ICD-10-CM | POA: Insufficient documentation

## 2024-08-06 DIAGNOSIS — D539 Nutritional anemia, unspecified: Secondary | ICD-10-CM | POA: Insufficient documentation

## 2024-08-06 DIAGNOSIS — R233 Spontaneous ecchymoses: Secondary | ICD-10-CM

## 2024-08-06 DIAGNOSIS — Z862 Personal history of diseases of the blood and blood-forming organs and certain disorders involving the immune mechanism: Secondary | ICD-10-CM

## 2024-08-06 LAB — IRON AND TIBC
Iron: 91 ug/dL (ref 45–182)
Saturation Ratios: 21 % (ref 17.9–39.5)
TIBC: 431 ug/dL (ref 250–450)
UIBC: 340 ug/dL

## 2024-08-06 LAB — CBC WITH DIFFERENTIAL (CANCER CENTER ONLY)
Abs Immature Granulocytes: 0 K/uL (ref 0.00–0.07)
Basophils Absolute: 0.1 K/uL (ref 0.0–0.1)
Basophils Relative: 2 %
Eosinophils Absolute: 0.2 K/uL (ref 0.0–0.5)
Eosinophils Relative: 4 %
HCT: 42.3 % (ref 39.0–52.0)
Hemoglobin: 13.9 g/dL (ref 13.0–17.0)
Immature Granulocytes: 0 %
Lymphocytes Relative: 46 %
Lymphs Abs: 2.6 K/uL (ref 0.7–4.0)
MCH: 31.9 pg (ref 26.0–34.0)
MCHC: 32.9 g/dL (ref 30.0–36.0)
MCV: 97 fL (ref 80.0–100.0)
Monocytes Absolute: 0.7 K/uL (ref 0.1–1.0)
Monocytes Relative: 12 %
Neutro Abs: 2 K/uL (ref 1.7–7.7)
Neutrophils Relative %: 36 %
Platelet Count: 239 K/uL (ref 150–400)
RBC: 4.36 MIL/uL (ref 4.22–5.81)
RDW: 14.4 % (ref 11.5–15.5)
WBC Count: 5.6 K/uL (ref 4.0–10.5)
nRBC: 0 % (ref 0.0–0.2)

## 2024-08-06 LAB — VITAMIN B12: Vitamin B-12: 565 pg/mL (ref 180–914)

## 2024-08-06 LAB — FOLATE: Folate: >20 ng/mL (ref >5.9)

## 2024-08-06 LAB — FERRITIN: Ferritin: 69 ng/mL (ref 24–336)

## 2024-08-06 NOTE — Assessment & Plan Note (Addendum)
 History of chronic anemia with macrocytosis.  Possible causes include vitamin B12 or folic acid  deficiency, or malabsorption. Occupational chemical exposure considered.  On his initial consult with us  on 01/08/2024, labs showed hemoglobin of 11.9, MCV 103.6.  White count 3500 with normal differential.  Platelet count normal at 247,000.  CMP was grossly unremarkable except for mildly elevated AST of 58.  Iron studies showed evidence of iron deficiency with iron decreased at 30, iron saturation decreased at 8%.  Ferritin was 67.  Folic acid  level was decreased at 4.4 ng/mL.  Vitamin B12 level was also decreased at 155 pg/mL.  Methylmalonic acid level was within normal limits.  LDH, haptoglobin, INR, PTT, fibrinogen , reticulocyte count were all within normal limits.  Coombs test negative. Antiparietal cell antibodies and intrinsic factor antibodies were negative, ruling out pernicious anemia.   He was started on vitamin B12, folic acid  and iron supplements orally.  The patient started taking folate and iron supplements from February 2025.  Labs today showed much improved blood counts with normal hemoglobin of 13.9, normal MCV of 97.  White count and platelet count are also normal today.  B12, folic acid , iron studies are all within normal limits today.  This is consistent with excellent response.  He was advised to continue supplements as mentioned above.  - Plan follow-up visit in 6 months to repeat blood work

## 2024-08-06 NOTE — Assessment & Plan Note (Signed)
 PT and PTT, fibrinogen were all within normal limits on 01/08/2024.  Will consider additional testing if symptoms were to persist.  Clinical suspicion low for platelet disorders or von Willebrand disease.

## 2024-08-06 NOTE — Progress Notes (Signed)
 [facility]  HEMATOLOGY CLINIC PROGRESS NOTE  PATIENT NAME: Jacob Davila   MR#: [id number] DOB: January 16, 1976  Patient Care Team: de [place], Quintin PARAS, MD as PCP - General (Family Medicine) Anner Alm ORN, MD as PCP - Cardiology (Cardiology)  Date of visit: 08/06/2024   ASSESSMENT & PLAN:   AWAIS COBARRUBIAS is a 47 y.o. gentleman with a past medical history of tachycardia, GERD, was referred to our service in February 2025 for evaluation of macrocytic anemia.     History of macrocytic anemia History of chronic anemia with macrocytosis.  Possible causes include vitamin B12 or folic acid  deficiency, or malabsorption. Occupational chemical exposure considered.  On his initial consult with us  on 01/08/2024, labs showed hemoglobin of 11.9, MCV 103.6.  White count 3500 with normal differential.  Platelet count normal at 247,000.  CMP was grossly unremarkable except for mildly elevated AST of 58.  Iron studies showed evidence of iron deficiency with iron decreased at 30, iron saturation decreased at 8%.  Ferritin was 67.  Folic acid  level was decreased at 4.4 ng/mL.  Vitamin B12 level was also decreased at 155 pg/mL.  Methylmalonic acid level was within normal limits.  LDH, haptoglobin, INR, PTT, fibrinogen , reticulocyte count were all within normal limits.  Coombs test negative. Antiparietal cell antibodies and intrinsic factor antibodies were negative, ruling out pernicious anemia.   He was started on vitamin B12, folic acid  and iron supplements orally.  The patient started taking folate and iron supplements from February 2025.  Labs today showed much improved blood counts with normal hemoglobin of 13.9, normal MCV of 97.  White count and platelet count are also normal today.  B12, folic acid , iron studies are all within normal limits today.  This is consistent with excellent response.  He was advised to continue supplements as mentioned above.  - Plan follow-up visit in 6 months  to repeat blood work  Easy bruisability PT and PTT, fibrinogen  were all within normal limits on 01/08/2024.  Will consider additional testing if symptoms were to persist.  Clinical suspicion low for platelet disorders or von Willebrand disease.  Prominent right hand knuckle, etiology unknown Presents with a prominent right hand knuckle noticeable for a few months without associated pain, trauma, or injury. - Order x-ray of the right hand to evaluate the prominent knuckle - Consider referral to orthopedic specialist based on x-ray findings  I spent a total of 20 minutes during this encounter with the patient including review of chart and various tests results, discussions about plan of care and coordination of care plan.  I reviewed lab results and outside records for this visit and discussed relevant results with the patient. Diagnosis, plan of care and treatment options were also discussed in detail with the patient. Opportunity provided to ask questions and answers provided to his apparent satisfaction. Provided instructions to call our clinic with any problems, questions or concerns prior to return visit. I recommended to continue follow-up with PCP and sub-specialists. He verbalized understanding and agreed with the plan. No barriers to learning was detected.  Chinita Patten, MD  08/06/2024 5:26 PM  [facility] - A DEPT OF [facility] [address] [place] [address] Dept: [phone] Dept Fax: [phone]   CHIEF COMPLAINT/ REASON FOR VISIT:  Follow-up for macrocytic anemia.  Workup in February 2025 revealed B12, folate and iron deficiency.  INTERVAL HISTORY:  Discussed the use of AI  scribe software for clinical note transcription with the patient, who gave verbal consent to proceed.  History of Present Illness Jacob Davila is a 48 year old male who presents for follow-up of anemia and a new concern of  knuckle prominence.  He has been taking iron and B12 supplements daily, which have improved his blood counts. His hemoglobin has normalized to 13.9 from previous values as low as 9.8. The size of his red blood cells has also improved, now measuring 97 compared to a previous high of 115. He experiences no side effects from the supplements, such as constipation or nausea.  He has a new concern regarding a prominent knuckle that has been present for a few months. He denies any history of trauma or injury to the area and states that it is not painful. He describes the prominence as noticeable enough to feel when putting his hand in his pocket. No other joint swelling or pain.  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    SUMMARY OF HEMATOLOGIC HISTORY:  On 10/17/2023, labs at his PCPs office showed hemoglobin of 10.9, hematocrit 32.6, MCV 107.  White count 4900 with normal differential.  Platelet count normal at 207,000.  This prompted referral to hematology service for further evaluation of macrocytic anemia.   He had repeat labs on 12/22/2023.  Hemoglobin was 12, hematocrit 35.7, MCV 105.5.  White count and platelet count remained normal.  Iron studies showed iron saturation of 53%, slightly elevated, otherwise unremarkable.  Previously his AST and ALT were elevated.  Repeat hepatic function panel on 12/22/2023 showed AST of 72 units/L, otherwise unremarkable LFTs.   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  He reports fatigue and easy bruising. The patient reports always feeling tired and has a history of tachycardia, for which he takes a beta blocker. The patient also reports trouble sleeping and has a history of scoliosis which causes pain, particularly in cold weather. The patient smokes cigars and consumes alcohol  very infrequently.   He denies recent chest pain on exertion, shortness of breath on minimal exertion, pre-syncopal episodes, or palpitations.   He had not noticed any recent bleeding such as epistaxis, hematuria or hematochezia.   Chronic anemia with macrocytosis.   Hemoglobin improved from 10.9 in November to 12. MCV was elevated at 110, now 103.6.   Possible causes include vitamin B12 or folic acid  deficiency, or malabsorption. Occupational chemical exposure considered.   On his initial consult with us  on 01/08/2024, labs showed hemoglobin of 11.9, MCV 103.6.  White count 3500 with normal differential.  Platelet count normal at 247,000.  CMP was grossly unremarkable except for mildly elevated AST of 58.  Iron studies showed evidence of iron deficiency with iron decreased at 30, iron saturation decreased at 8%.  Ferritin was 67.  Folic acid  level was decreased at 4.4 ng/mL.  Vitamin B12 level was also decreased at 155 pg/mL.  Methylmalonic acid level was within normal limits.  LDH, haptoglobin, INR, PTT, fibrinogen , reticulocyte count were all within normal limits.  Coombs test negative.  Antiparietal cell antibodies and intrinsic factor antibodies were negative, ruling out pernicious anemia.   He was started on vitamin B12, folic acid  and iron supplements orally.   The patient started taking folate and iron supplements from February 2025 but his B12 was not filled by his  pharmacy for unknown reason.  Hemoglobin was better at 12.3 as of 04/02/2024.  MCV slightly better at 102.8.  White count normal at 4300.  I have reviewed the past medical history, past surgical history, social history and family history with the patient and they are unchanged from previous note.  ALLERGIES: He is allergic to allegra [fexofenadine], darvon [propoxyphene], tessalon [benzonatate], latex, and morphine and codeine.  MEDICATIONS:  Current Outpatient Medications  Medication Sig Dispense Refill   cyanocobalamin   (VITAMIN B12) 1000 MCG tablet Take 1 tablet (1,000 mcg total) by mouth daily. 90 tablet 1   cyclobenzaprine  (FLEXERIL ) 10 MG tablet TAKE 1 TABLET(10 MG) BY MOUTH TWICE DAILY AS NEEDED FOR MUSCLE SPASMS 30 tablet 0   ferrous sulfate  325 (65 FE) MG EC tablet Take 1 tablet (325 mg total) by mouth daily with breakfast. 30 tablet 3   folic acid  (FOLVITE ) 1 MG tablet Take 1 tablet (1 mg total) by mouth daily. 90 tablet 1   lidocaine  (LIDODERM ) 5 % Place 1 patch onto the skin daily. Remove & Discard patch within 12 hours or as directed by MD 30 patch 0   metoprolol  succinate (TOPROL  XL) 25 MG 24 hr tablet Take 1.5 tablets (37.5 mg total) by mouth at bedtime. 45 tablet 0   naproxen  (NAPROSYN ) 500 MG tablet Take 1 tablet (500 mg total) by mouth 2 (two) times daily. 30 tablet 0   omeprazole  (PRILOSEC OTC) 20 MG tablet Take 40 mg by mouth in the morning and at bedtime.     traMADol  (ULTRAM ) 50 MG tablet Take 1 tablet (50 mg total) by mouth every 6 (six) hours as needed for severe pain (pain score 7-10). 10 tablet 0   No current facility-administered medications for this visit.     REVIEW OF SYSTEMS:    Review of Systems - Oncology  All other pertinent systems were reviewed with the patient and are negative.  PHYSICAL EXAMINATION:   Onc Performance Status - 08/06/24 1000       ECOG Perf Status   ECOG Perf Status Fully active, able to carry on all pre-disease performance without restriction      KPS SCALE   KPS % SCORE Normal, no compliants, no evidence of disease           Vitals:   08/06/24 1021  BP: 106/65  Pulse: 69  Resp: 16  Temp: (!) 97.4 F (36.3 C)  SpO2: 97%   Filed Weights   08/06/24 1021  Weight: 154 lb 3.2 oz (69.9 kg)    Physical Exam Constitutional:      General: He is not in acute distress.    Appearance: Normal appearance.  HENT:     Head: Normocephalic and atraumatic.  Eyes:     General: No scleral icterus.    Conjunctiva/sclera: Conjunctivae normal.   Cardiovascular:     Rate and Rhythm: Normal rate and regular rhythm.  Pulmonary:     Effort: Pulmonary effort is normal.  Abdominal:     General: There is no distension.  Musculoskeletal:     Comments: Swelling of right third MCP joint  Neurological:     General: No focal deficit present.     Mental Status: He is alert and oriented to person, place, and time.  Psychiatric:        Mood and Affect: Mood normal.        Behavior: Behavior normal.        Thought Content: Thought content normal.  LABORATORY DATA:   I have reviewed the data as listed.  Results for orders placed or performed in visit on 08/06/24  Folate  Result Value Ref Range   Folate >20.0 >5.9 ng/mL  Vitamin B12  Result Value Ref Range   Vitamin B-12 565 180 - 914 pg/mL  Ferritin  Result Value Ref Range   Ferritin 69 24 - 336 ng/mL  Iron and TIBC  Result Value Ref Range   Iron 91 45 - 182 ug/dL   TIBC 568 749 - 549 ug/dL   Saturation Ratios 21 17.9 - 39.5 %   UIBC 340 ug/dL  CBC with Differential (Cancer Center Only)  Result Value Ref Range   WBC Count 5.6 4.0 - 10.5 K/uL   RBC 4.36 4.22 - 5.81 MIL/uL   Hemoglobin 13.9 13.0 - 17.0 g/dL   HCT 57.6 60.9 - 47.9 %   MCV 97.0 80.0 - 100.0 fL   MCH 31.9 26.0 - 34.0 pg   MCHC 32.9 30.0 - 36.0 g/dL   RDW 85.5 88.4 - 84.4 %   Platelet Count 239 150 - 400 K/uL   nRBC 0.0 0.0 - 0.2 %   Neutrophils Relative % 36 %   Neutro Abs 2.0 1.7 - 7.7 K/uL   Lymphocytes Relative 46 %   Lymphs Abs 2.6 0.7 - 4.0 K/uL   Monocytes Relative 12 %   Monocytes Absolute 0.7 0.1 - 1.0 K/uL   Eosinophils Relative 4 %   Eosinophils Absolute 0.2 0.0 - 0.5 K/uL   Basophils Relative 2 %   Basophils Absolute 0.1 0.0 - 0.1 K/uL   Immature Granulocytes 0 %   Abs Immature Granulocytes 0.00 0.00 - 0.07 K/uL     RADIOGRAPHIC STUDIES:  CT Maxillofacial Wo Contrast CLINICAL DATA:  Neck trauma, intoxicated or obtained. Head and neck trauma.  EXAM: CT HEAD WITHOUT  CONTRAST  CT MAXILLOFACIAL WITHOUT CONTRAST  CT CERVICAL SPINE WITHOUT CONTRAST  TECHNIQUE: Multidetector CT imaging of the head, cervical spine, and maxillofacial structures were performed using the standard protocol without intravenous contrast. Multiplanar CT image reconstructions of the cervical spine and maxillofacial structures were also generated.  RADIATION DOSE REDUCTION: This exam was performed according to the departmental dose-optimization program which includes automated exposure control, adjustment of the mA and/or kV according to patient size and/or use of iterative reconstruction technique.  COMPARISON:  None Available.  FINDINGS: CT HEAD FINDINGS  Brain: No evidence of acute infarction, hemorrhage, hydrocephalus, extra-axial collection or mass lesion/mass effect.  Vascular: No hyperdense vessel or unexpected calcification.  Skull: Normal. Negative for fracture or focal lesion.  CT MAXILLOFACIAL FINDINGS  Osseous: No fracture or mandibular dislocation. No destructive process.  Orbits: Negative. No traumatic or inflammatory finding.  Sinuses: No hemosinus  Soft tissues: Contusion to the left cheek.  CT CERVICAL SPINE FINDINGS  Alignment: No traumatic malalignment  Skull base and vertebrae: No acute fracture. No primary bone lesion or focal pathologic process.  Soft tissues and spinal canal: No prevertebral fluid or swelling. No visible canal hematoma.  Disc levels: Degenerative spurring in the lower cervical spine. T1-2 posterior fusion.  Upper chest: No evidence of injury  IMPRESSION: No evidence of acute intracranial or cervical spine injury. Negative for facial fracture.  Electronically Signed   By: Dorn Roulette M.D.   On: 01/21/2024 04:20 CT Cervical Spine Wo Contrast CLINICAL DATA:  Neck trauma, intoxicated or obtained. Head and neck trauma.  EXAM: CT HEAD WITHOUT CONTRAST  CT MAXILLOFACIAL WITHOUT CONTRAST  CT CERVICAL  SPINE WITHOUT CONTRAST  TECHNIQUE: Multidetector CT imaging of the head, cervical spine, and maxillofacial structures were performed using the standard protocol without intravenous contrast. Multiplanar CT image reconstructions of the cervical spine and maxillofacial structures were also generated.  RADIATION DOSE REDUCTION: This exam was performed according to the departmental dose-optimization program which includes automated exposure control, adjustment of the mA and/or kV according to patient size and/or use of iterative reconstruction technique.  COMPARISON:  None Available.  FINDINGS: CT HEAD FINDINGS  Brain: No evidence of acute infarction, hemorrhage, hydrocephalus, extra-axial collection or mass lesion/mass effect.  Vascular: No hyperdense vessel or unexpected calcification.  Skull: Normal. Negative for fracture or focal lesion.  CT MAXILLOFACIAL FINDINGS  Osseous: No fracture or mandibular dislocation. No destructive process.  Orbits: Negative. No traumatic or inflammatory finding.  Sinuses: No hemosinus  Soft tissues: Contusion to the left cheek.  CT CERVICAL SPINE FINDINGS  Alignment: No traumatic malalignment  Skull base and vertebrae: No acute fracture. No primary bone lesion or focal pathologic process.  Soft tissues and spinal canal: No prevertebral fluid or swelling. No visible canal hematoma.  Disc levels: Degenerative spurring in the lower cervical spine. T1-2 posterior fusion.  Upper chest: No evidence of injury  IMPRESSION: No evidence of acute intracranial or cervical spine injury. Negative for facial fracture.  Electronically Signed   By: Dorn Roulette M.D.   On: 01/21/2024 04:20 CT Head Wo Contrast CLINICAL DATA:  Neck trauma, intoxicated or obtained. Head and neck trauma.  EXAM: CT HEAD WITHOUT CONTRAST  CT MAXILLOFACIAL WITHOUT CONTRAST  CT CERVICAL SPINE WITHOUT CONTRAST  TECHNIQUE: Multidetector CT imaging of the  head, cervical spine, and maxillofacial structures were performed using the standard protocol without intravenous contrast. Multiplanar CT image reconstructions of the cervical spine and maxillofacial structures were also generated.  RADIATION DOSE REDUCTION: This exam was performed according to the departmental dose-optimization program which includes automated exposure control, adjustment of the mA and/or kV according to patient size and/or use of iterative reconstruction technique.  COMPARISON:  None Available.  FINDINGS: CT HEAD FINDINGS  Brain: No evidence of acute infarction, hemorrhage, hydrocephalus, extra-axial collection or mass lesion/mass effect.  Vascular: No hyperdense vessel or unexpected calcification.  Skull: Normal. Negative for fracture or focal lesion.  CT MAXILLOFACIAL FINDINGS  Osseous: No fracture or mandibular dislocation. No destructive process.  Orbits: Negative. No traumatic or inflammatory finding.  Sinuses: No hemosinus  Soft tissues: Contusion to the left cheek.  CT CERVICAL SPINE FINDINGS  Alignment: No traumatic malalignment  Skull base and vertebrae: No acute fracture. No primary bone lesion or focal pathologic process.  Soft tissues and spinal canal: No prevertebral fluid or swelling. No visible canal hematoma.  Disc levels: Degenerative spurring in the lower cervical spine. T1-2 posterior fusion.  Upper chest: No evidence of injury  IMPRESSION: No evidence of acute intracranial or cervical spine injury. Negative for facial fracture.  Electronically Signed   By: Dorn Roulette M.D.   On: 01/21/2024 04:20    Orders Placed This Encounter  Procedures   DG Hand Complete Right    Standing Status:   Future    Number of Occurrences:   1    Expected Date:   08/06/2024    Expiration Date:   08/06/2025    Reason for Exam (SYMPTOM  OR DIAGNOSIS REQUIRED):   Prominent right 3rd MCP joint with swelling and redness. Please evaluate     Preferred imaging location?:   [facility]  CBC with Differential (Cancer Center Only)    Standing Status:   Future    Expected Date:   02/03/2025    Expiration Date:   05/04/2025   CMP (Cancer Center only)    Standing Status:   Future    Expected Date:   02/03/2025    Expiration Date:   05/04/2025   Iron and TIBC    Standing Status:   Future    Expected Date:   02/03/2025    Expiration Date:   05/04/2025   Ferritin    Standing Status:   Future    Expected Date:   02/03/2025    Expiration Date:   05/04/2025   Vitamin B12    Standing Status:   Future    Expected Date:   02/03/2025    Expiration Date:   05/04/2025   Folate    Standing Status:   Future    Expected Date:   02/03/2025    Expiration Date:   05/04/2025     Future Appointments  Date Time Provider Department Center  09/02/2024  9:10 AM [facility] [id number]  09/11/2024  4:20 PM Anner Alm ORN, MD [facility]  09/12/2024  4:10 PM de Peru, Raymond J, MD [facility] [phone] [facility]     This document was completed utilizing speech recognition software. Grammatical errors, random word insertions, pronoun errors, and incomplete sentences are an occasional consequence of this system due to software limitations, ambient noise, and hardware issues. Any formal questions or concerns about the content, text or information contained within the body of this dictation should be directly addressed to the provider for clarification.

## 2024-08-07 ENCOUNTER — Ambulatory Visit: Payer: Self-pay

## 2024-08-07 NOTE — Telephone Encounter (Signed)
 FYI Only or Action Required?: FYI only for provider.  Patient was last seen in primary care on 04/22/2024 by Knute Thersia Bitters, FNP.  Called Nurse Triage reporting Vomiting and Chills.  Symptoms began today.  Interventions attempted: Rest, hydration, or home remedies.  Symptoms are: unchanged.  Triage Disposition: See Physician Within 24 Hours  Patient/caregiver understands and will follow disposition?: Yes  Copied from CRM #[id number]. Topic: Clinical - Red Word Triage >> Aug 07, 2024  4:20 PM Harlene ORN wrote: Red Word that prompted transfer to Nurse Triage: vomiting and chills started this morning Reason for Disposition  [1] MILD or MODERATE vomiting AND [2] present > 48 hours (2 days)  (Exception: Mild vomiting with associated diarrhea.)  Answer Assessment - Initial Assessment Questions 1. VOMITING SEVERITY: How many times have you vomited in the past 24 hours?      5 times 2. ONSET: When did the vomiting begin?      Started this morning 3. FLUIDS: What fluids or food have you vomited up today? Have you been able to keep any fluids down?     Vomited up fluids and food this morning-reports it has been a few hours since he threw up. Has been able to keep water down sipping slowly.  4. ABDOMEN PAIN: Are your having any abdomen pain? If Yes : How bad is it and what does it feel like? (e.g., crampy, dull, intermittent, constant)      Yes-mild pain from vomiting 5. DIARRHEA: Is there any diarrhea? If Yes, ask: How many times today?      no 6. CONTACTS: Is there anyone else in the family with the same symptoms?      no 7. CAUSE: What do you think is causing your vomiting?     Unsure-patient saw his hematologist yesterday without any issues yesterday.  8. HYDRATION STATUS: Any signs of dehydration? (e.g., dry mouth [not only dry lips], too weak to stand) When did you last urinate?     Urinated a couple of hours ago-no other symptoms.  9. OTHER SYMPTOMS: Do  you have any other symptoms? (e.g., fever, headache, vertigo, vomiting blood or coffee grounds, recent head injury)     no  Protocols used: Vomiting-A-AH

## 2024-08-08 ENCOUNTER — Ambulatory Visit (INDEPENDENT_AMBULATORY_CARE_PROVIDER_SITE_OTHER): Admitting: Family Medicine

## 2024-08-08 ENCOUNTER — Encounter: Payer: Self-pay | Admitting: Oncology

## 2024-08-08 VITALS — BP 113/78 | HR 67 | Temp 97.7°F | Ht 71.0 in | Wt 153.0 lb

## 2024-08-08 DIAGNOSIS — A084 Viral intestinal infection, unspecified: Secondary | ICD-10-CM | POA: Diagnosis not present

## 2024-08-08 MED ORDER — ONDANSETRON 4 MG PO TBDP: 4.0000 mg | ORAL_TABLET | Freq: Three times a day (TID) | ORAL | 0 refills | Status: DC | PRN

## 2024-08-08 NOTE — Patient Instructions (Addendum)
Please follow up if symptoms do not improve or as needed.    Viral Gastroenteritis, Adult  Viral gastroenteritis is also known as the stomach flu. This condition may affect your stomach, your small intestine, and your large intestine. It can cause sudden watery poop (diarrhea), fever, and vomiting. This condition is caused by certain germs (viruses). These germs can be passed from person to person very easily (are contagious). Having watery poop and vomiting can make you feel weak and cause you to not have enough water in your body (get dehydrated). This can make you tired and thirsty, make you have a dry mouth, and make it so you pee (urinate) less often. It is important to replace the fluids that you lose from having watery poop and vomiting. What are the causes? You can get sick by catching germs from other people. You can also get sick by: Eating food, drinking water, or touching a surface that has the germs on it (is contaminated). Sharing utensils or other personal items with a person who is sick. What increases the risk? Having a weak body defense system (immune system). Living with one or more children who are younger than 2 years. Living in a nursing home. Going on cruise ships. What are the signs or symptoms? Symptoms of this condition start suddenly. Symptoms may last for a few days or for as long as a week. Common symptoms include: Watery poop. Vomiting. Other symptoms include: Fever. Headache. Feeling tired (fatigue). Pain in the belly (abdomen). Chills. Feeling weak. Feeling like you may vomit (nauseous). Muscle aches. Not feeling hungry. How is this treated? This condition typically goes away on its own. The focus of treatment is to replace the fluids that you lose. This condition may be treated with: An ORS (oral rehydration solution). This is a drink that helps you replace fluids and minerals your body lost. It is sold at pharmacies and stores. Medicines to help  with your symptoms. Probiotic supplements to reduce symptoms of watery poop. Fluids given through an IV tube, if needed. Older adults and people with other diseases or a weak body defense system are at higher risk for not having enough water in the body. Follow these instructions at home: Eating and drinking  Take an ORS as told by your doctor. Drink clear fluids in small amounts as you are able. Clear fluids include: Water. Ice chips. Fruit juice that has water added to it (is diluted). Low-calorie sports drinks. Drink enough fluid to keep your pee (urine) pale yellow. Eat small amounts of healthy foods every 3-4 hours as you are able. This may include whole grains, fruits, vegetables, lean meats, and yogurt. Avoid fluids that have a lot of sugar or caffeine in them. This includes energy drinks, sports drinks, and soda. Avoid spicy or fatty foods. Avoid alcohol. General instructions  Wash your hands often. This is very important after you have watery poop or you vomit. If you cannot use soap and water, use hand sanitizer. Make sure that all people in your home wash their hands well and often. Take over-the-counter and prescription medicines only as told by your doctor. Rest at home while you get better. Watch your condition for any changes. Take a warm bath to help with any burning or pain from having watery poop. Keep all follow-up visits. Contact a doctor if: You cannot keep fluids down. Your symptoms get worse. You have new symptoms. You feel light-headed or dizzy. You have muscle cramps. Get help right away if:  You have chest pain. You have trouble breathing, or you are breathing very fast. You have a fast heartbeat. You feel very weak or you faint. You have a very bad headache, a stiff neck, or both. You have a rash. You have very bad pain, cramping, or bloating in your belly. Your skin feels cold and clammy. You feel mixed up (confused). You have pain when you  pee. You have signs of not having enough water in the body, such as: Dark pee, hardly any pee, or no pee. Cracked lips. Dry mouth. Sunken eyes. Feeling very sleepy. Feeling weak. You have signs of bleeding, such as: You see blood in your vomit. Your vomit looks like coffee grounds. You have bloody or black poop or poop that looks like tar. These symptoms may be an emergency. Get help right away. Call 911. Do not wait to see if the symptoms will go away. Do not drive yourself to the hospital. Summary Viral gastroenteritis is also known as the stomach flu. This condition can cause sudden watery poop (diarrhea), fever, and vomiting. These germs can be passed from person to person very easily. Take an ORS (oral rehydration solution) as told by your doctor. This is a drink that is sold at pharmacies and stores. Wash your hands often, especially after having watery poop or vomiting. If you cannot use soap and water, use hand sanitizer. This information is not intended to replace advice given to you by your health care provider. Make sure you discuss any questions you have with your health care provider. Document Revised: 09/06/2021 Document Reviewed: 09/06/2021 Elsevier Patient Education  2024 [employer].

## 2024-08-08 NOTE — Progress Notes (Signed)
 Subjective  CC:  Chief Complaint  Patient presents with   Emesis    Pt stated that he still feels weak and has not ate much. Last emesis was yesterday 12 noon.    HPI: Jacob Davila is a 48 y.o. male who presents to the office today to address the problems listed above in the chief complaint. Discussed the use of AI scribe software for clinical note transcription with the patient, who gave verbal consent to proceed.  Same day acute visit; PCP not available. New pt to me. Chart reviewed.   History of Present Illness Jacob Davila is a 48 year old male who presents with vomiting and diarrhea.  He began experiencing vomiting and diarrhea yesterday. Initially, he had episodes of vomiting, which then transitioned to frequent loose and watery bowel movements. His last bowel movement was this morning around 7:00 AM, and he last vomited yesterday around noon.  He describes occasional abdominal cramping and soreness. No blood in the stool, chest pain, severe abdominal pain, or lightheadedness. He feels tired and has chills, but is unsure about having a fever, although he was sweating and 'running hot'.  He has been able to drink some ginger ale and ate yesterday but believes it may have exacerbated his diarrhea. He has not traveled recently and is unaware of any recent antibiotic use. He recalls someone mentioning having similar symptoms about a week ago.  He reports back pain but does not elaborate on any chronic back issues. He has been unable to sleep well due to the symptoms, resulting in fatigue.   Assessment  1. Viral gastroenteritis      Plan  Assessment and Plan Assessment & Plan Viral gastroenteritis with acute nausea, vomiting, and diarrhea. Normal vitals and benign abdomen. No red flag sxs Acute viral gastroenteritis with persistent diarrhea, likely self-limiting. Symptoms consistent with viral etiology. - Prescribed antiemetic for nausea as needed. - Advised hydration with  fluids like Gatorade and broths. - Recommended bland diet: chicken broth, bread, bananas, toast, apples, white rice. - Advised against greasy and spicy foods. - Recommended Imodium for diarrhea as needed. - Provided after-visit summary with dietary and medication instructions.  Fatigue secondary to viral gastroenteritis Fatigue due to viral gastroenteritis and disrupted sleep. Vital signs stable. - Advised rest and recovery. - Provided doctor's note for work absence.    Follow up: prn No orders of the defined types were placed in this encounter.  Meds ordered this encounter  Medications   ondansetron  (ZOFRAN -ODT) 4 MG disintegrating tablet    Sig: Take 1 tablet (4 mg total) by mouth every 8 (eight) hours as needed for nausea or vomiting.    Dispense:  20 tablet    Refill:  0     I reviewed the patients updated PMH, FH, and SocHx.  Patient Active Problem List   Diagnosis Date Noted   Folic acid  deficiency 01/22/2024   Anemia due to vitamin B12 deficiency 01/22/2024   Iron deficiency anemia 01/22/2024   Easy bruisability 01/08/2024   Wellness examination 09/13/2023   Insomnia 09/13/2023   URI (upper respiratory infection) 10/26/2022   Seizure-like activity (HCC) 09/08/2022   Decreased strength of lower extremity 06/02/2022   Dysphagia 09/15/2021   Hepatic steatosis 07/08/2021   Weakness of left lower extremity 07/08/2021   Transaminitis 05/27/2021   History of macrocytic anemia 05/27/2021   Elevated alkaline phosphatase level 05/27/2021   Varicose veins of leg with swelling, bilateral 05/17/2021   Paroxysmal tachycardia (HCC) 05/17/2021  Sinus tachycardia 05/17/2021   Dizziness 05/17/2021   Current Meds  Medication Sig   cyanocobalamin  (VITAMIN B12) 1000 MCG tablet Take 1 tablet (1,000 mcg total) by mouth daily.   cyclobenzaprine  (FLEXERIL ) 10 MG tablet TAKE 1 TABLET(10 MG) BY MOUTH TWICE DAILY AS NEEDED FOR MUSCLE SPASMS   ferrous sulfate  325 (65 FE) MG EC tablet  Take 1 tablet (325 mg total) by mouth daily with breakfast.   folic acid  (FOLVITE ) 1 MG tablet Take 1 tablet (1 mg total) by mouth daily.   lidocaine  (LIDODERM ) 5 % Place 1 patch onto the skin daily. Remove & Discard patch within 12 hours or as directed by MD   metoprolol  succinate (TOPROL  XL) 25 MG 24 hr tablet Take 1.5 tablets (37.5 mg total) by mouth at bedtime.   naproxen  (NAPROSYN ) 500 MG tablet Take 1 tablet (500 mg total) by mouth 2 (two) times daily.   omeprazole  (PRILOSEC OTC) 20 MG tablet Take 40 mg by mouth in the morning and at bedtime.   ondansetron  (ZOFRAN -ODT) 4 MG disintegrating tablet Take 1 tablet (4 mg total) by mouth every 8 (eight) hours as needed for nausea or vomiting.   traMADol  (ULTRAM ) 50 MG tablet Take 1 tablet (50 mg total) by mouth every 6 (six) hours as needed for severe pain (pain score 7-10).   Allergies: Patient is allergic to allegra [fexofenadine], darvon [propoxyphene], tessalon [benzonatate], latex, and morphine and codeine. Family History: Patient family history includes Atrial fibrillation in his mother; CAD in his paternal grandfather; Colon cancer in his maternal aunt and paternal uncle; Heart failure in his mother; Hyperlipidemia in his father; Hypertension in his father. Social History:  Patient  reports that he has been smoking cigars. He has never used smokeless tobacco. He reports current alcohol use. He reports that he does not use drugs.  Review of Systems: Constitutional: Negative for fever malaise or anorexia Cardiovascular: negative for chest pain Respiratory: negative for SOB or persistent cough Gastrointestinal: negative for abdominal pain  Objective  Vitals: BP 113/78   Pulse 67   Temp 97.7 F (36.5 C)   Ht 5' 11 (1.803 m)   Wt 153 lb (69.4 kg)   SpO2 97%   BMI 21.34 kg/m  General: no acute distress , A&Ox3, appears fatigued HEENT: PEERL, conjunctiva normal, neck is supple Cardiovascular:  RRR without murmur or gallop.   Respiratory:  Good breath sounds bilaterally, CTAB with normal respiratory effort Gastrointestinal: soft, flat abdomen, normal active bowel sounds, no palpable masses, no hepatosplenomegaly, no appreciated hernias Non tender Skin:  Warm, no rashes Commons side effects, risks, benefits, and alternatives for medications and treatment plan prescribed today were discussed, and the patient expressed understanding of the given instructions. Patient is instructed to call or message via MyChart if he/she has any questions or concerns regarding our treatment plan. No barriers to understanding were identified. We discussed Red Flag symptoms and signs in detail. Patient expressed understanding regarding what to do in case of urgent or emergency type symptoms.  Medication list was reconciled, printed and provided to the patient in AVS. Patient instructions and summary information was reviewed with the patient as documented in the AVS. This note was prepared with assistance of Dragon voice recognition software. Occasional wrong-word or sound-a-like substitutions may have occurred due to the inherent limitations of voice recognition software

## 2024-09-02 ENCOUNTER — Other Ambulatory Visit (HOSPITAL_BASED_OUTPATIENT_CLINIC_OR_DEPARTMENT_OTHER)

## 2024-09-02 DIAGNOSIS — Z0000 Encounter for general adult medical examination without abnormal findings: Secondary | ICD-10-CM

## 2024-09-03 LAB — COMPREHENSIVE METABOLIC PANEL WITH GFR
ALT: 50 IU/L — ABNORMAL HIGH (ref 0–44)
AST: 75 IU/L — ABNORMAL HIGH (ref 0–40)
Albumin: 3.9 g/dL — ABNORMAL LOW (ref 4.1–5.1)
Alkaline Phosphatase: 133 IU/L — ABNORMAL HIGH (ref 47–123)
BUN/Creatinine Ratio: 8 — ABNORMAL LOW (ref 9–20)
BUN: 9 mg/dL (ref 6–24)
Bilirubin Total: 0.7 mg/dL (ref 0.0–1.2)
CO2: 19 mmol/L — ABNORMAL LOW (ref 20–29)
Calcium: 8.8 mg/dL (ref 8.7–10.2)
Chloride: 99 mmol/L (ref 96–106)
Creatinine, Ser: 1.19 mg/dL (ref 0.76–1.27)
Globulin, Total: 2.4 g/dL (ref 1.5–4.5)
Glucose: 70 mg/dL (ref 70–99)
Potassium: 4.2 mmol/L (ref 3.5–5.2)
Sodium: 136 mmol/L (ref 134–144)
Total Protein: 6.3 g/dL (ref 6.0–8.5)
eGFR: 75 mL/min/1.73 (ref >59)

## 2024-09-03 LAB — CBC WITH DIFFERENTIAL/PLATELET
Basophils Absolute: 0.1 x10E3/uL (ref 0.0–0.2)
Basos: 2 %
EOS (ABSOLUTE): 0.2 x10E3/uL (ref 0.0–0.4)
Eos: 3 %
Hematocrit: 44.9 % (ref 37.5–51.0)
Hemoglobin: 15 g/dL (ref 13.0–17.7)
Immature Grans (Abs): 0 x10E3/uL (ref 0.0–0.1)
Immature Granulocytes: 0 %
Lymphocytes Absolute: 2.8 x10E3/uL (ref 0.7–3.1)
Lymphs: 46 %
MCH: 32.3 pg (ref 26.6–33.0)
MCHC: 33.4 g/dL (ref 31.5–35.7)
MCV: 97 fL (ref 79–97)
Monocytes Absolute: 0.8 x10E3/uL (ref 0.1–0.9)
Monocytes: 12 %
Neutrophils Absolute: 2.3 x10E3/uL (ref 1.4–7.0)
Neutrophils: 37 %
Platelets: 265 x10E3/uL (ref 150–450)
RBC: 4.65 x10E6/uL (ref 4.14–5.80)
RDW: 13.2 % (ref 11.6–15.4)
WBC: 6.2 x10E3/uL (ref 3.4–10.8)

## 2024-09-03 LAB — TSH RFX ON ABNORMAL TO FREE T4: TSH: 1.04 u[IU]/mL (ref 0.450–4.500)

## 2024-09-03 LAB — LIPID PANEL
Chol/HDL Ratio: 1.4 ratio (ref 0.0–5.0)
Cholesterol, Total: 179 mg/dL (ref 100–199)
HDL: 129 mg/dL (ref >39)
LDL Chol Calc (NIH): 38 mg/dL (ref 0–99)
Triglycerides: 61 mg/dL (ref 0–149)
VLDL Cholesterol Cal: 12 mg/dL (ref 5–40)

## 2024-09-11 ENCOUNTER — Ambulatory Visit: Attending: Cardiology | Admitting: Cardiology

## 2024-09-12 ENCOUNTER — Encounter (HOSPITAL_BASED_OUTPATIENT_CLINIC_OR_DEPARTMENT_OTHER): Admitting: Family Medicine

## 2024-09-12 ENCOUNTER — Encounter (HOSPITAL_BASED_OUTPATIENT_CLINIC_OR_DEPARTMENT_OTHER): Payer: Self-pay

## 2024-09-13 ENCOUNTER — Encounter: Payer: Self-pay | Admitting: Cardiology

## 2024-09-17 ENCOUNTER — Ambulatory Visit: Payer: Self-pay | Admitting: Oncology

## 2024-09-20 ENCOUNTER — Ambulatory Visit (HOSPITAL_BASED_OUTPATIENT_CLINIC_OR_DEPARTMENT_OTHER): Payer: Self-pay | Admitting: Family Medicine

## 2024-09-27 ENCOUNTER — Encounter: Payer: Self-pay | Admitting: Cardiology

## 2024-09-27 ENCOUNTER — Other Ambulatory Visit: Payer: Self-pay | Admitting: Cardiology

## 2024-09-30 MED ORDER — METOPROLOL SUCCINATE ER 25 MG PO TB24: 35.5000 mg | ORAL_TABLET | Freq: Every day | ORAL | 0 refills | Status: DC

## 2024-11-13 ENCOUNTER — Ambulatory Visit: Payer: Self-pay

## 2024-11-13 NOTE — Telephone Encounter (Signed)
 FYI Only or Action Required?: FYI only for provider: UC advised.  Patient was last seen in primary care on 08/08/2024 by Jodie Lavern CROME, MD.  Called Nurse Triage reporting Facial Pain.  Symptoms began yesterday.  Interventions attempted: OTC medications: Loratadine, Tylenol .  Symptoms are: gradually worsening.  Triage Disposition: No disposition on file.  Patient/caregiver understands and will follow disposition?:  Reason for Disposition  [1] SEVERE sinus pain (e.g., excruciating) AND [2] not improved 2 hours after pain medicine  Answer Assessment - Initial Assessment Questions Hx of Asthma. Took Loratadine and Tylenol . Patient denies SOB or chest pain. Advised UC for patient today.   1. LOCATION: Where does it hurt?      Around the nose, eyes  2. ONSET: When did the sinus pain start?  (e.g., hours, days)      Yesterday morning  3. SEVERITY: How bad is the pain?   (Scale 0-10; or none, mild, moderate or severe)     6/10  4. NASAL CONGESTION: Is the nose blocked? If Yes, ask: Can you open it or must you breathe through your mouth?     Denies  5. NASAL DISCHARGE: Do you have discharge from your nose? If so ask, What color?     Constantly running, cream color  6. FEVER: Do you have a fever? If Yes, ask: What is it, how was it measured, and when did it start?      101 yesterday   7. OTHER SYMPTOMS: Do you have any other symptoms? (e.g., sore throat, cough, earache, difficulty breathing)     Sneezing, productive cough - cream colored mucous, chills.  Protocols used: Sinus Pain or Congestion-A-AH  Copied from CRM #[id number]. Topic: Clinical - Red Word Triage >> Nov 13, 2024  9:43 AM Tiffini S wrote: [facility] that prompted transfer to Nurse Triage: sore throat, runny nose, headache, cough, drainage, chills, fever- started yesterday morning- started sneezing

## 2024-11-28 ENCOUNTER — Encounter: Payer: Self-pay | Admitting: Emergency Medicine

## 2024-11-28 ENCOUNTER — Ambulatory Visit: Attending: Emergency Medicine | Admitting: Emergency Medicine

## 2024-11-28 VITALS — BP 118/70 | HR 71 | Ht 67.0 in | Wt 153.0 lb

## 2024-11-28 DIAGNOSIS — R002 Palpitations: Secondary | ICD-10-CM | POA: Insufficient documentation

## 2024-11-28 DIAGNOSIS — I479 Paroxysmal tachycardia, unspecified: Secondary | ICD-10-CM | POA: Insufficient documentation

## 2024-11-28 DIAGNOSIS — R600 Localized edema: Secondary | ICD-10-CM | POA: Diagnosis present

## 2024-11-28 DIAGNOSIS — I872 Venous insufficiency (chronic) (peripheral): Secondary | ICD-10-CM | POA: Insufficient documentation

## 2024-11-28 MED ORDER — METOPROLOL SUCCINATE ER 50 MG PO TB24: 50.0000 mg | ORAL_TABLET | Freq: Every day | ORAL | 3 refills | Status: AC

## 2024-11-28 NOTE — Progress Notes (Signed)
 " Cardiology Office Note:    Date:  11/28/2024  ID:  Jacob Davila, DOB Feb 06, 1976, MRN [id number] PCP: de [place], Raymond J, MD  [facility] Providers Cardiologist:  Alm Clay, MD       Patient Profile:       Chief Complaint: 1 year follow-up History of Present Illness:  Jacob Davila is a 49 y.o. male with visit-pertinent history of paroxysmal tachycardia, bilateral lower EXTR edema, chronic venous insufficiency/varicose veins (follows with vascular surgery) and hepatic steatosis  Underwent lower extremity venous duplex study in 2022 in the setting of bilateral lower extremity edema which showed evidence of chronic venous insufficiency.  He was referred to vascular surgery for ongoing management.  Echocardiogram 05/2021 indicated LVEF of 60 to 65%, normal LV function, no RWMA, normal RV, no significant valvular abnormalities.  ZIO monitor in 06/2021 showed sinus rhythm, sinus tachycardia, no arrhythmias.  He was seen in clinic on 12/2021.  Noted on home monitoring Kardia device suggested ongoing intermittent sinus tachycardia.  He was started on low-dose metoprolol .  Diltiazem  was discontinued and was felt this could be contributing to his lower extremity edema.  He underwent successful ablation of the left great saphenous vein from the knee to saphenofemoral junction and 04/2022.  Last seen in clinic on 10/02/2023.  He reported improvement in palpitations on metoprolol .  No changes are made in his follow-up in 6 months.   Discussed the use of AI scribe software for clinical note transcription with the patient, who gave verbal consent to proceed.  History of Present Illness Jacob Davila is a 49 year old male who presents for 1 year cardiovascular evaluation.  He has episodes of tachycardia a couple of times a month lasting 30 to 60 minutes at a time, with resting heart rates in the high nineties during these episodes. Episodes are associated with decreased appetite and sleep  disturbance. He tracks his heart rhythm with a KardiaMobile device and takes his metoprolol  at nighttime, but feels it is less effective as episodes are becoming more frequent.  He has persistent lower extremity swelling despite prior procedures and manages this with compression stockings and leg elevation. Standing longer than 1 to 2 hours worsens the swelling, which he feels also increases his heart rate.  He denies chest pain, shortness of breath, orthopnea, PND, syncope, near syncope, lightheadedness, dizziness   Review of systems:  Please see the history of present illness. All other systems are reviewed and otherwise negative.      Studies Reviewed:    EKG Interpretation Date/Time:  Thursday November 28 2024 15:49:46 EST Ventricular Rate:  71 PR Interval:  114 QRS Duration:  86 QT Interval:  376 QTC Calculation: 408 R Axis:   58  Text Interpretation: Normal sinus rhythm Normal ECG When compared with ECG of 01-Jun-2023 14:09, No significant change was found Confirmed by Rana Dixon [phone]) on 11/28/2024 3:52:29 PM    ZIO 05/28/2021 Monitor worn from 7/8-8/6 2022 Baseline rhythm was sinus rhythm: Minimum heart rate 63 bpm, max heart rate 158 bpm, average 94 bpm. Rare(<1%) PACs and PVCs noted. Sinus tachycardia noted but no arrhythmias noted. No tachyarrhythmias or bradycardia arrhythmias noted. (No atrial fibrillation, atrial flutter, supraventricular tachycardia, paroxysmal atrial tachycardia or ventricular tachycardia). No bradycardia noted. No patient triggered events.   Pretty normal study.  Shows normal sinus rhythm that can go fast.  No slow rhythms.  No abnormal rhythms.  Echocardiogram 06/07/2021  1. Left ventricular ejection fraction, by estimation, is  60 to 65%. Left  ventricular ejection fraction by 3D volume is 56 %. The left ventricle has  normal function. The left ventricle has no regional wall motion  abnormalities. Left ventricular diastolic   parameters  were normal. The average left ventricular global longitudinal  strain is -25.3 %. The global longitudinal strain is normal.   2. Right ventricular systolic function is normal. The right ventricular  size is normal.   3. The mitral valve is normal in structure. No evidence of mitral valve  regurgitation. No evidence of mitral stenosis.   4. The aortic valve is tricuspid. There is mild thickening of the aortic  valve. Aortic valve regurgitation is not visualized. No aortic stenosis is  present.   5. The inferior vena cava is normal in size with greater than 50%  respiratory variability, suggesting right atrial pressure of 3 mmHg.  Risk Assessment/Calculations:              Physical Exam:   VS:  BP 118/70 (BP Location: Right Arm, Patient Position: Sitting, Cuff Size: Normal)   Pulse 71   Ht 5' 7 (1.702 m)   Wt 153 lb (69.4 kg)   BMI 23.96 kg/m    Wt Readings from Last 3 Encounters:  11/28/24 153 lb (69.4 kg)  08/08/24 153 lb (69.4 kg)  08/06/24 154 lb 3.2 oz (69.9 kg)    GEN: Well nourished, well developed in no acute distress NECK: No JVD; No carotid bruits CARDIAC: RRR, no murmurs, rubs, gallops RESPIRATORY:  Clear to auscultation without rales, wheezing or rhonchi  ABDOMEN: Soft, non-tender, non-distended EXTREMITIES:  No edema; No acute deformity      Assessment and Plan:  Paroxysmal tachycardia Palpitations Zio 05/2021 no tachyarrhythmias or bradycardia arrhythmias.  Monitor suggested sinus tachycardia as the main etiology to his palpitations - Today he reports episodes of fast heart rates occurring a couple times a month that seem to have increased in frequency over the past year.  This occurs more at nighttime and has caused issues with his sleeping.  He tracks his heart rate at home with a Kardia mobile device and during these episodes his heart rate is normally at 90 bpm - EKG today shows NSR without acute ischemia changes or arrhythmia - Plan to increase metoprolol   succinate to 50 mg daily - Continue to monitor heart rate at home  Venous insufficiency Varicose veins Echo 05/2021 showed LVEF 60 to 65%, no RWMA, normal diastolic parameters, RV normal, no valvular abnormality S/p successful ablation of the left great saphenous vein from the knee to the saphenofemoral junction in 04/2022 - He continues to wear compression stockings daily - Today he appears euvolemic and well compensated on exam.  Jacob Davila is stable - Has been managed by vascular surgery      Dispo:  Return in about 1 year (around 11/28/2025).  Signed, Lum LITTIE Louis, NP  "

## 2024-11-28 NOTE — Patient Instructions (Signed)
 Medication Instructions:  INCREASE METOPROLOL  SUCCINATE TO 50 MG DAILY.  Lab Work: NONE TO BE DONE TODAY.  Testing/Procedures: NONE  Follow-Up: At [facility], you and your health needs are our priority.  As part of our continuing mission to provide you with exceptional heart care, our providers are all part of one team.  This team includes your primary Cardiologist (physician) and Advanced Practice Providers or APPs (Physician Assistants and Nurse Practitioners) who all work together to provide you with the care you need, when you need it.  Your next appointment:   1 YEAR  Provider:   Alm Clay, MD

## 2025-01-02 ENCOUNTER — Encounter (HOSPITAL_BASED_OUTPATIENT_CLINIC_OR_DEPARTMENT_OTHER): Admitting: Family Medicine

## 2025-02-03 ENCOUNTER — Ambulatory Visit: Admitting: Oncology

## 2025-02-03 ENCOUNTER — Other Ambulatory Visit
